# Patient Record
Sex: Female | Born: 1945 | Race: White | Hispanic: No | Marital: Married | Smoking: Never smoker
Health system: Southern US, Community
[De-identification: ages and names within clinical notes are randomized; demographics above are authoritative.]

## PROBLEM LIST (undated history)

## (undated) ENCOUNTER — Emergency Department (HOSPITAL_COMMUNITY): Payer: PPO | Source: Home / Self Care

## (undated) DIAGNOSIS — F329 Major depressive disorder, single episode, unspecified: Secondary | ICD-10-CM

## (undated) DIAGNOSIS — F32A Depression, unspecified: Secondary | ICD-10-CM

## (undated) DIAGNOSIS — J301 Allergic rhinitis due to pollen: Secondary | ICD-10-CM

## (undated) DIAGNOSIS — F419 Anxiety disorder, unspecified: Secondary | ICD-10-CM

## (undated) HISTORY — DX: Depression, unspecified: F32.A

## (undated) HISTORY — DX: Major depressive disorder, single episode, unspecified: F32.9

## (undated) HISTORY — DX: Anxiety disorder, unspecified: F41.9

## (undated) HISTORY — DX: Allergic rhinitis due to pollen: J30.1

## (undated) HISTORY — PX: TONSILLECTOMY: SHX5217

---

## 2001-07-07 ENCOUNTER — Encounter (INDEPENDENT_AMBULATORY_CARE_PROVIDER_SITE_OTHER): Payer: Self-pay | Admitting: *Deleted

## 2001-07-07 ENCOUNTER — Ambulatory Visit (HOSPITAL_COMMUNITY): Admission: RE | Admit: 2001-07-07 | Discharge: 2001-07-07 | Payer: Self-pay | Admitting: Gastroenterology

## 2001-11-14 ENCOUNTER — Other Ambulatory Visit: Admission: RE | Admit: 2001-11-14 | Discharge: 2001-11-14 | Payer: Self-pay | Admitting: *Deleted

## 2001-11-20 ENCOUNTER — Encounter: Admission: RE | Admit: 2001-11-20 | Discharge: 2001-11-20 | Payer: Self-pay | Admitting: *Deleted

## 2001-11-20 ENCOUNTER — Encounter: Payer: Self-pay | Admitting: *Deleted

## 2003-01-21 ENCOUNTER — Other Ambulatory Visit: Admission: RE | Admit: 2003-01-21 | Discharge: 2003-01-21 | Payer: Self-pay | Admitting: Obstetrics and Gynecology

## 2004-03-13 ENCOUNTER — Other Ambulatory Visit: Admission: RE | Admit: 2004-03-13 | Discharge: 2004-03-13 | Payer: Self-pay | Admitting: Family Medicine

## 2006-02-16 ENCOUNTER — Other Ambulatory Visit: Admission: RE | Admit: 2006-02-16 | Discharge: 2006-02-16 | Payer: Self-pay | Admitting: Obstetrics and Gynecology

## 2008-05-31 ENCOUNTER — Emergency Department (HOSPITAL_COMMUNITY): Admission: EM | Admit: 2008-05-31 | Discharge: 2008-05-31 | Payer: Self-pay | Admitting: Emergency Medicine

## 2008-06-07 ENCOUNTER — Encounter (HOSPITAL_COMMUNITY): Admission: RE | Admit: 2008-06-07 | Discharge: 2008-09-03 | Payer: Self-pay | Admitting: Emergency Medicine

## 2011-03-19 NOTE — Procedures (Signed)
[place]  Patient:    Angie Liu, Angie Liu Visit Number: [id number] MRN: [id number]          Service Type: Attending:  Petra Kuba, M.D. Dictated by:   Petra Kuba, M.D. Proc. Date: 07/07/01   CC:         Heather Roberts, M.D.   Procedure Report  PROCEDURE:  Colonoscopy with biopsy.  INDICATION:  Screening.  Consent was signed after risks, benefits, methods, and options thoroughly discussed in the office.  MEDICATIONS:  Demerol 50 mg, Versed 5 mg.  DESCRIPTION OF PROCEDURE:  Rectal inspection was pertinent for external hemorrhoids, small.  Digital exam was negative.  The video pediatric colonoscope was inserted, advanced around the colon to the cecum.  This did require some abdominal pressure but no position changes.  Cecum was identified by the appendiceal orifice and the ileocecal valve; in fact, the scope was inserted a short way into the terminal ileum, which was normal.  Photo documentation was obtained, and the scope was slowly withdrawn.  Cecum, ascending, and transverse were normal.  As the scope was withdrawn around the splenic flexure, an occasional left-sided diverticulum was seen.  These were rare.  Also one tiny questionable mid-descending polyp was seen and was cold biopsied x 2.  The scope was then further withdrawn.  No additional findings were seen as we slowly withdrew back to the rectum.  Once back in the rectum the scope was retroflexed, pertinent for some internal hemorrhoids.  The scope was straightened and readvanced a short way around the sigmoid, air was suctioned, and scope removed.  Though the prep was adequate, there was some liquid stool that required washing and suctioning.  The patient tolerated the procedure well.  There was no obvious immediate complication.  ENDOSCOPIC DIAGNOSES: 1. Small internal-external hemorrhoids. 2. Rare left-sided diverticula. 3. Tiny questionable descending polyp, status post cold  biopsy. 4. Otherwise within normal limits to the terminal ileum.  PLAN:  Await pathology.  Yearly rectals and guaiacs per Dr. Orson Aloe.  Happy to see back p.r.n.  Otherwise await pathology to determine future colonic screening. Dictated by:   Petra Kuba, M.D. Attending:  Petra Kuba, M.D. DD:  07/07/01 TD:  07/08/01 Job: [id number]

## 2012-02-11 ENCOUNTER — Encounter: Payer: Self-pay | Admitting: Family Medicine

## 2012-10-17 ENCOUNTER — Ambulatory Visit (INDEPENDENT_AMBULATORY_CARE_PROVIDER_SITE_OTHER): Payer: Medicare Other | Admitting: Family Medicine

## 2012-10-17 ENCOUNTER — Encounter: Payer: Self-pay | Admitting: Family Medicine

## 2012-10-17 VITALS — BP 120/60 | HR 77 | Temp 98.3°F | Resp 16 | Ht 65.75 in | Wt 128.6 lb

## 2012-10-17 DIAGNOSIS — Z79899 Other long term (current) drug therapy: Secondary | ICD-10-CM

## 2012-10-17 DIAGNOSIS — Z7689 Persons encountering health services in other specified circumstances: Secondary | ICD-10-CM

## 2012-10-17 DIAGNOSIS — Z7189 Other specified counseling: Secondary | ICD-10-CM

## 2012-10-17 NOTE — Patient Instructions (Addendum)
It was a pleasure meeting you today!  You will contact our office next spring or summer to schedule a CPE/PAP in September 2014.

## 2012-10-23 DIAGNOSIS — F418 Other specified anxiety disorders: Secondary | ICD-10-CM | POA: Insufficient documentation

## 2012-10-23 DIAGNOSIS — J302 Other seasonal allergic rhinitis: Secondary | ICD-10-CM | POA: Insufficient documentation

## 2012-10-23 NOTE — Progress Notes (Signed)
S:  This 66 y.o. Cauc female is new to [facility]; her previous PCPs either moved or retired. She has a psychiatrist (Dr. Alphonzo Lemmings) who has managed chronic depression (medications: Luvox, Remeron, Ambien and Xanax) since 2009. She takes multiple OTC supplements. The other active medical problems include osteoporosis and "hay fever" for which she takes Allegra. 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 She does consume alcohol daily. She recently has GYN exam with Dr. Zelphia Cairo and PAP was normal. Last colonoscopy was 2008 w/ Dr. Ewing Schlein.  ROS: Negative for medication adverse reactions, unexplained weight loss or gain, fatigue (though pt has chronic sleep disturbance), fever/ chills, CP or tightness, SOB or DOE, Gi disturbance, myalgias, arthralgias, rashes or pallor, agitation or behavior disturbances, SI/HI.  PMHx, Social HX and Family Hx have been reviewed.  O:  Filed Vitals:   10/17/12 1443  BP: 120/60  Pulse: 77  Temp: 98.3 F (36.8 C)  Resp: 16   GEN: In NAD; WN,WD. HEENT: [place]/AT; EOMI w/ clear conj and sclerae; otherwise unremarkable. NECK: Supple. COR: RRR. LUNGS: Normal resp rate and effort. MS: FROM; no c/c/e or deformities. NEURO: A&O x 3; Cns intact. Nonfocal. Mentation- pt is attentive and conversant; mood and affect are appropriate.  A/P: 1. Establishing care with new doctor, encounter for   2. Encounter for medication review    Pt would like to receive "Well Woman/GYN" care here also but would like to wait until Sept 2014 to schedule that exam.

## 2013-04-08 ENCOUNTER — Ambulatory Visit (INDEPENDENT_AMBULATORY_CARE_PROVIDER_SITE_OTHER): Payer: Medicare Other | Admitting: Emergency Medicine

## 2013-04-08 ENCOUNTER — Ambulatory Visit: Payer: Medicare Other

## 2013-04-08 VITALS — BP 100/67 | HR 66 | Temp 98.4°F | Resp 16 | Ht 67.6 in | Wt 128.6 lb

## 2013-04-08 DIAGNOSIS — S93409A Sprain of unspecified ligament of unspecified ankle, initial encounter: Secondary | ICD-10-CM

## 2013-04-08 DIAGNOSIS — M25579 Pain in unspecified ankle and joints of unspecified foot: Secondary | ICD-10-CM

## 2013-04-08 DIAGNOSIS — IMO0002 Reserved for concepts with insufficient information to code with codable children: Secondary | ICD-10-CM

## 2013-04-08 DIAGNOSIS — Z23 Encounter for immunization: Secondary | ICD-10-CM

## 2013-04-08 DIAGNOSIS — L089 Local infection of the skin and subcutaneous tissue, unspecified: Secondary | ICD-10-CM

## 2013-04-08 MED ORDER — SILVER SULFADIAZINE 1 % EX CREA: TOPICAL_CREAM | Freq: Every day | CUTANEOUS | Status: DC

## 2013-04-08 MED ORDER — SULFAMETHOXAZOLE-TRIMETHOPRIM 800-160 MG PO TABS: 1.0000 | ORAL_TABLET | Freq: Two times a day (BID) | ORAL | Status: DC

## 2013-04-08 MED ORDER — NAPROXEN SODIUM 550 MG PO TABS: 550.0000 mg | ORAL_TABLET | Freq: Two times a day (BID) | ORAL | Status: DC

## 2013-04-08 NOTE — Progress Notes (Signed)
[facility] [address], [place] [address] [phone]  Date:  04/08/2013   Name:  Angie Liu   DOB:  1945-12-22   MRN:  [id number]  PCP:  No PCP Per Patient    Chief Complaint: scrapped right ankle while riding on zipline   History of Present Illness:  Angie Liu is a 67 y.o. very pleasant female patient who presents with the following:  Injured right ankle a week ago while on a zip line. Has pain and swelling in lateral ankle.  Pain with ambulation.  Also has large lateral abrasion ankle with surrounding erythema.  No fever or chills.  No improvement with over the counter medications or other home remedies. Denies other complaint or health concern today.   Patient Active Problem List   Diagnosis Date Noted  . Allergic rhinitis, seasonal 10/23/2012  . Depression with anxiety 10/23/2012    Past Medical History  Diagnosis Date  . Depression   . Anxiety   . Hayfever     Past Surgical History  Procedure Laterality Date  . Tonsillectomy      History  Substance Use Topics  . Smoking status: Never Smoker   . Smokeless tobacco: Not on file  . Alcohol Use: Yes    Family History  Problem Relation Age of Onset  . Cancer Mother     colon  . Diabetes Father     No Known Allergies  Medication list has been reviewed and updated.  Current Outpatient Prescriptions on File Prior to Visit  Medication Sig Dispense Refill  . mirtazapine (REMERON) 15 MG tablet Take 15 mg by mouth at bedtime.      . ALPRAZolam (XANAX) 0.5 MG tablet Take 0.5 mg by mouth at bedtime as needed.      . fexofenadine (ALLEGRA) 180 MG tablet Take 180 mg by mouth daily.      . fluvoxaMINE (LUVOX) 50 MG tablet Take 50 mg by mouth at bedtime.      Marland Kitchen zolpidem (AMBIEN) 10 MG tablet Take 10 mg by mouth at bedtime as needed.       No current facility-administered medications on file prior to visit.    Review of Systems:  As per HPI, otherwise negative.    Physical  Examination: Filed Vitals:   04/08/13 0944  BP: 100/67  Pulse: 66  Temp: 98.4 F (36.9 C)  Resp: 16   Filed Vitals:   04/08/13 0944  Height: 5' 7.6" (1.717 m)  Weight: 128 lb 9.6 oz (58.333 kg)   Body mass index is 19.79 kg/(m^2). Ideal Body Weight: Weight in (lb) to have BMI = 25: 162.2   GEN: WDWN, NAD, Non-toxic, Alert & Oriented x 3 HEENT: Atraumatic, Normocephalic.  Ears and Nose: No external deformity. EXTR: No clubbing/cyanosis/edema NEURO: Normal gait.  PSYCH: Normally interactive. Conversant. Not depressed or anxious appearing.  Calm demeanor.  RIGHT ANKLE:  Large lateral abrasion with cellulitis.  Tender, swollen lateral ankle with guarding.  No crepitus  Assessment and Plan: Sprain ankle Anaprox Abrasion Cellulitis Silvadene dressing TDAP Septra  Signed,  Phillips Odor, MD   [facility] reading (PRIMARY) by  Dr. Dareen Piano.  Negative for osseous injury.

## 2013-04-08 NOTE — Patient Instructions (Addendum)
Abrasion °An abrasion is a cut or scrape of the skin. Abrasions do not extend through all layers of the skin and most heal within 10 days. It is important to care for your abrasion properly to prevent infection. °CAUSES  °Most abrasions are caused by falling on, or gliding across, the ground or other surface. When your skin rubs on something, the outer and inner layer of skin rubs off, causing an abrasion. °DIAGNOSIS  °Your caregiver will be able to diagnose an abrasion during a physical exam.  °TREATMENT  °Your treatment depends on how large and deep the abrasion is. Generally, your abrasion will be cleaned with water and a mild soap to remove any dirt or debris. An antibiotic ointment may be put over the abrasion to prevent an infection. A bandage (dressing) may be wrapped around the abrasion to keep it from getting dirty.  °You may need a tetanus shot if: °· You cannot remember when you had your last tetanus shot. °· You have never had a tetanus shot. °· The injury broke your skin. °If you get a tetanus shot, your arm may swell, get red, and feel warm to the touch. This is common and not a problem. If you need a tetanus shot and you choose not to have one, there is a rare chance of getting tetanus. Sickness from tetanus can be serious.  °HOME CARE INSTRUCTIONS  °· If a dressing was applied, change it at least once a day or as directed by your caregiver. If the bandage sticks, soak it off with warm water.   °· Wash the area with water and a mild soap to remove all the ointment 2 times a day. Rinse off the soap and pat the area dry with a clean towel.   °· Reapply any ointment as directed by your caregiver. This will help prevent infection and keep the bandage from sticking. Use gauze over the wound and under the dressing to help keep the bandage from sticking.   °· Change your dressing right away if it becomes wet or dirty.   °· Only take over-the-counter or prescription medicines for pain, discomfort, or fever as  directed by your caregiver.   °· Follow up with your caregiver within 24 48 hours for a wound check, or as directed. If you were not given a wound-check appointment, look closely at your abrasion for redness, swelling, or pus. These are signs of infection. °SEEK IMMEDIATE MEDICAL CARE IF:  °· You have increasing pain in the wound.   °· You have redness, swelling, or tenderness around the wound.   °· You have pus coming from the wound.   °· You have a fever or persistent symptoms for more than 2 3 days. °· You have a fever and your symptoms suddenly get worse. °· You have a bad smell coming from the wound or dressing.   °MAKE SURE YOU:  °· Understand these instructions. °· Will watch your condition. °· Will get help right away if you are not doing well or get worse. °Document Released: 07/28/2005 Document Revised: 10/04/2012 Document Reviewed: 09/21/2011 °ExitCare® Patient Information ©2014 [employer]. ° °

## 2013-04-30 ENCOUNTER — Other Ambulatory Visit: Payer: Self-pay | Admitting: Emergency Medicine

## 2013-05-01 NOTE — Telephone Encounter (Signed)
Dr Dareen Piano, do you want to give this pt any RFs of this, or need to see pt back first?

## 2014-07-18 ENCOUNTER — Encounter: Payer: Self-pay | Admitting: *Deleted

## 2014-07-18 DIAGNOSIS — Z1231 Encounter for screening mammogram for malignant neoplasm of breast: Secondary | ICD-10-CM | POA: Insufficient documentation

## 2014-07-22 ENCOUNTER — Other Ambulatory Visit: Payer: Self-pay | Admitting: Ophthalmology

## 2015-03-06 ENCOUNTER — Encounter: Payer: Self-pay | Admitting: Family Medicine

## 2015-03-06 ENCOUNTER — Ambulatory Visit (INDEPENDENT_AMBULATORY_CARE_PROVIDER_SITE_OTHER): Payer: Medicare Other | Admitting: Family Medicine

## 2015-03-06 VITALS — BP 102/56 | HR 67 | Temp 98.2°F | Resp 16 | Ht 65.75 in | Wt 121.8 lb

## 2015-03-06 DIAGNOSIS — E2839 Other primary ovarian failure: Secondary | ICD-10-CM

## 2015-03-06 DIAGNOSIS — M797 Fibromyalgia: Secondary | ICD-10-CM | POA: Diagnosis not present

## 2015-03-06 DIAGNOSIS — G259 Extrapyramidal and movement disorder, unspecified: Secondary | ICD-10-CM | POA: Diagnosis not present

## 2015-03-06 DIAGNOSIS — Z1211 Encounter for screening for malignant neoplasm of colon: Secondary | ICD-10-CM

## 2015-03-06 DIAGNOSIS — M810 Age-related osteoporosis without current pathological fracture: Secondary | ICD-10-CM | POA: Diagnosis not present

## 2015-03-06 DIAGNOSIS — H9193 Unspecified hearing loss, bilateral: Secondary | ICD-10-CM

## 2015-03-06 DIAGNOSIS — M7918 Myalgia, other site: Secondary | ICD-10-CM

## 2015-03-06 DIAGNOSIS — Z0000 Encounter for general adult medical examination without abnormal findings: Secondary | ICD-10-CM

## 2015-03-06 DIAGNOSIS — Z1212 Encounter for screening for malignant neoplasm of rectum: Secondary | ICD-10-CM

## 2015-03-06 NOTE — Progress Notes (Signed)
Subjective:    Patient ID: Angie Liu, female    DOB: 1946/07/09, 69 y.o.   MRN: [id number]  HPI  This 69 y.o. Female is here for [facility] Subsequent annual exam. She is in a wellness program w/ Dr. Mingo Amber; this involved testing for toxins and supplements designed to "detox" her system. Pt had been taking prescriptions medications for anxiety and insomnia but if off all prescriptions medications and feels well ("the best I have felt in a long time").  She has chronic hearing loss and needs a referral (w/ very specific requirements) to follow-up at [facility]. Pt also receives treatment for "movement and balance" at [facility] and requests a referral for their services.  HCM: PAP- NA.           MMG- Current.           CRS- 2002; overdue.           DEXA- Due.           IMM- Current but pt declines Pneumonia vaccine.           Vision- Current.           Dental- current.  Patient Active Problem List   Diagnosis Date Noted  . Visit for screening mammogram 07/18/2014  . Allergic rhinitis, seasonal 10/23/2012  . Depression with anxiety- in resmisson 10/23/2012    Prior to Admission medications   Medication Sig Start Date End Date Taking? Authorizing Provider  OVER THE COUNTER MEDICATION 1.5 mg.   Yes Historical Provider, MD  OVER THE COUNTER MEDICATION daily.   Yes Historical Provider, MD  OVER THE COUNTER MEDICATION 600 mg at bedtime.   Yes Historical Provider, MD  OVER THE COUNTER MEDICATION    Yes Historical Provider, MD  OVER THE COUNTER MEDICATION    Yes Historical Provider, MD  QUERCETIN PO Take by mouth.   Yes Historical Provider, MD  vitamin C (ASCORBIC ACID) 500 MG tablet Take 500 mg by mouth 2 (two) times daily.   Yes Historical Provider, MD    Past Surgical History  Procedure Laterality Date  . Tonsillectomy      History   Social History  . Marital Status: Married    Spouse Name: [name] N/A  . Number of Children: N/A  . Years of  Education: N/A   Occupational History  . Engineer, [employer] for [employer]   Social History Main Topics  . Smoking status: Never Smoker   . Smokeless tobacco: Not on file  . Alcohol Use: Yes  . Drug Use: No  . Sexual Activity: Not on file   Other Topics Concern  . Other volunteer activities.   Social History Narrative   Family History  Problem Relation Age of Onset  . Cancer Mother     colon  . Diabetes Father     Review of Systems  Constitutional: Negative.   HENT: Positive for hearing loss.   Eyes: Negative.   Respiratory: Negative.   Cardiovascular: Negative.   Gastrointestinal: Negative.   Endocrine: Negative.   Genitourinary: Negative.   Musculoskeletal: Positive for myalgias, back pain, arthralgias, neck pain and neck stiffness. Negative for joint swelling and gait problem.  Allergic/Immunologic: Negative.   Neurological: Negative.   Hematological: Negative.   Psychiatric/Behavioral: Positive for sleep disturbance. Negative for behavioral problems, confusion, dysphoric mood and decreased concentration.       Objective:   Physical Exam  Constitutional: Vital signs are normal. She appears well-developed and  well-nourished. No distress.  Blood pressure 102/56, pulse 67, temperature 98.2 F (36.8 C), temperature source Oral, resp. rate 16, height 5' 5.75" (1.67 m), weight 121 lb 12.8 oz (55.248 kg), SpO2 97 %.   HENT:  Head: Normocephalic and atraumatic.  Right Ear: External ear normal. Decreased hearing is noted.  Left Ear: External ear normal. Decreased hearing is noted.  Nose: No nasal deformity or septal deviation.  Mouth/Throat: Uvula is midline, oropharynx is clear and moist and mucous membranes are normal. No oral lesions. Normal dentition. No dental caries.  Wearing hearing aids.  Eyes: Conjunctivae, EOM and lids are normal. Pupils are equal, round, and reactive to light. No scleral icterus.  Wears corrective lenses.  Neck: Trachea normal, normal range of  motion, full passive range of motion without pain and phonation normal. Neck supple. No spinous process tenderness and no muscular tenderness present. Carotid bruit is not present. No thyroid mass and no thyromegaly present.  Pulmonary/Chest: Right breast exhibits no inverted nipple, no mass, no nipple discharge, no skin change and no tenderness. Left breast exhibits no inverted nipple, no mass, no nipple discharge, no skin change and no tenderness. Breasts are symmetrical.  Abdominal: Soft. Normal appearance, normal aorta and bowel sounds are normal. She exhibits no distension and no mass. There is no hepatosplenomegaly. There is no tenderness. There is no guarding and no CVA tenderness.  Musculoskeletal:       Cervical back: Normal.       Thoracic back: Normal.       Lumbar back: Normal.  Mild muscle spasms in paravertebral muscles; remainder of exam unremarkable.  Lymphadenopathy:       Head (right side): No submental, no submandibular, no tonsillar, no preauricular, no posterior auricular and no occipital adenopathy present.       Head (left side): No submental, no submandibular, no tonsillar, no preauricular, no posterior auricular and no occipital adenopathy present.    She has no cervical adenopathy.    She has no axillary adenopathy.       Right: No inguinal and no supraclavicular adenopathy present.       Left: No inguinal and no supraclavicular adenopathy present.  Neurological: She is alert. She has normal strength and normal reflexes. She displays no atrophy. No cranial nerve deficit or sensory deficit. She exhibits normal muscle tone. She displays a negative Romberg sign. Coordination and gait normal. She displays no Babinski's sign on the right side. She displays no Babinski's sign on the left side.  GET UP and GO Test: time= 6 seconds.  Skin: Skin is warm, dry and intact. No ecchymosis, no lesion and no rash noted. She is not diaphoretic. No cyanosis or erythema. No pallor. Nails show  no clubbing.  Few superficial varicosities on posterior aspect of legs.  Psychiatric: She has a normal mood and affect. Her speech is normal and behavior is normal. Judgment and thought content normal. Cognition and memory are normal.  Nursing note and vitals reviewed.      Assessment & Plan:  Medicare annual wellness visit, subsequent- Pt declined labs; Dr. Jaynie Collins performs routine labs.   Hearing loss, bilateral - Plan: Ambulatory referral to Audiology  Osteoporosis - Last  DEXA > 3 years ago. Plan: DG Bone Density  Estrogen deficiency - Plan: DG Bone Density  Myofacial muscle pain - Plan: Ambulatory referral to Physical Therapy  Movement disorder - Plan: Ambulatory referral to Physical Therapy  Encounter for colorectal cancer screening - Plan: Ambulatory referral to Gastroenterology

## 2015-03-06 NOTE — Patient Instructions (Signed)

## 2015-03-25 ENCOUNTER — Telehealth: Payer: Self-pay | Admitting: *Deleted

## 2015-03-25 NOTE — Telephone Encounter (Signed)
Mailed RX for hearing evaluation, per Dr Leward Quan.

## 2015-04-16 ENCOUNTER — Telehealth: Payer: Self-pay | Admitting: Radiology

## 2015-04-16 NOTE — Telephone Encounter (Signed)
[facility] called to inform our office that this patient cancelled her colonoscopy

## 2015-04-16 NOTE — Telephone Encounter (Signed)
Noted  

## 2015-07-24 LAB — HM MAMMOGRAPHY

## 2015-07-24 LAB — HM DEXA SCAN

## 2015-08-27 ENCOUNTER — Encounter: Payer: Self-pay | Admitting: Family Medicine

## 2015-09-23 ENCOUNTER — Encounter: Payer: Self-pay | Admitting: Family Medicine

## 2015-11-03 DIAGNOSIS — M9901 Segmental and somatic dysfunction of cervical region: Secondary | ICD-10-CM | POA: Diagnosis not present

## 2015-11-03 DIAGNOSIS — M5030 Other cervical disc degeneration, unspecified cervical region: Secondary | ICD-10-CM | POA: Diagnosis not present

## 2015-11-03 DIAGNOSIS — M5136 Other intervertebral disc degeneration, lumbar region: Secondary | ICD-10-CM | POA: Diagnosis not present

## 2015-11-03 DIAGNOSIS — M9902 Segmental and somatic dysfunction of thoracic region: Secondary | ICD-10-CM | POA: Diagnosis not present

## 2015-11-03 DIAGNOSIS — M9903 Segmental and somatic dysfunction of lumbar region: Secondary | ICD-10-CM | POA: Diagnosis not present

## 2015-11-04 DIAGNOSIS — N362 Urethral caruncle: Secondary | ICD-10-CM | POA: Diagnosis not present

## 2015-11-08 DIAGNOSIS — N951 Menopausal and female climacteric states: Secondary | ICD-10-CM | POA: Diagnosis not present

## 2015-11-10 DIAGNOSIS — N951 Menopausal and female climacteric states: Secondary | ICD-10-CM | POA: Diagnosis not present

## 2015-11-12 DIAGNOSIS — N951 Menopausal and female climacteric states: Secondary | ICD-10-CM | POA: Diagnosis not present

## 2015-12-02 DIAGNOSIS — F411 Generalized anxiety disorder: Secondary | ICD-10-CM | POA: Diagnosis not present

## 2015-12-09 DIAGNOSIS — G4700 Insomnia, unspecified: Secondary | ICD-10-CM | POA: Diagnosis not present

## 2015-12-12 DIAGNOSIS — M9902 Segmental and somatic dysfunction of thoracic region: Secondary | ICD-10-CM | POA: Diagnosis not present

## 2015-12-12 DIAGNOSIS — M9901 Segmental and somatic dysfunction of cervical region: Secondary | ICD-10-CM | POA: Diagnosis not present

## 2015-12-12 DIAGNOSIS — M5030 Other cervical disc degeneration, unspecified cervical region: Secondary | ICD-10-CM | POA: Diagnosis not present

## 2015-12-12 DIAGNOSIS — M9903 Segmental and somatic dysfunction of lumbar region: Secondary | ICD-10-CM | POA: Diagnosis not present

## 2015-12-12 DIAGNOSIS — M5136 Other intervertebral disc degeneration, lumbar region: Secondary | ICD-10-CM | POA: Diagnosis not present

## 2015-12-15 DIAGNOSIS — M9903 Segmental and somatic dysfunction of lumbar region: Secondary | ICD-10-CM | POA: Diagnosis not present

## 2015-12-15 DIAGNOSIS — M9901 Segmental and somatic dysfunction of cervical region: Secondary | ICD-10-CM | POA: Diagnosis not present

## 2015-12-15 DIAGNOSIS — M5136 Other intervertebral disc degeneration, lumbar region: Secondary | ICD-10-CM | POA: Diagnosis not present

## 2015-12-15 DIAGNOSIS — M9902 Segmental and somatic dysfunction of thoracic region: Secondary | ICD-10-CM | POA: Diagnosis not present

## 2015-12-15 DIAGNOSIS — M5030 Other cervical disc degeneration, unspecified cervical region: Secondary | ICD-10-CM | POA: Diagnosis not present

## 2015-12-22 DIAGNOSIS — F3181 Bipolar II disorder: Secondary | ICD-10-CM | POA: Diagnosis not present

## 2015-12-29 DIAGNOSIS — M9903 Segmental and somatic dysfunction of lumbar region: Secondary | ICD-10-CM | POA: Diagnosis not present

## 2015-12-29 DIAGNOSIS — M9902 Segmental and somatic dysfunction of thoracic region: Secondary | ICD-10-CM | POA: Diagnosis not present

## 2015-12-29 DIAGNOSIS — M5136 Other intervertebral disc degeneration, lumbar region: Secondary | ICD-10-CM | POA: Diagnosis not present

## 2015-12-29 DIAGNOSIS — M5030 Other cervical disc degeneration, unspecified cervical region: Secondary | ICD-10-CM | POA: Diagnosis not present

## 2015-12-29 DIAGNOSIS — M9901 Segmental and somatic dysfunction of cervical region: Secondary | ICD-10-CM | POA: Diagnosis not present

## 2016-01-05 DIAGNOSIS — F411 Generalized anxiety disorder: Secondary | ICD-10-CM | POA: Diagnosis not present

## 2016-01-09 DIAGNOSIS — M9903 Segmental and somatic dysfunction of lumbar region: Secondary | ICD-10-CM | POA: Diagnosis not present

## 2016-01-09 DIAGNOSIS — M5136 Other intervertebral disc degeneration, lumbar region: Secondary | ICD-10-CM | POA: Diagnosis not present

## 2016-01-09 DIAGNOSIS — M9901 Segmental and somatic dysfunction of cervical region: Secondary | ICD-10-CM | POA: Diagnosis not present

## 2016-01-09 DIAGNOSIS — M5030 Other cervical disc degeneration, unspecified cervical region: Secondary | ICD-10-CM | POA: Diagnosis not present

## 2016-01-09 DIAGNOSIS — M9902 Segmental and somatic dysfunction of thoracic region: Secondary | ICD-10-CM | POA: Diagnosis not present

## 2016-01-12 DIAGNOSIS — R5383 Other fatigue: Secondary | ICD-10-CM | POA: Diagnosis not present

## 2016-01-14 DIAGNOSIS — R5383 Other fatigue: Secondary | ICD-10-CM | POA: Diagnosis not present

## 2016-01-19 DIAGNOSIS — M9902 Segmental and somatic dysfunction of thoracic region: Secondary | ICD-10-CM | POA: Diagnosis not present

## 2016-01-19 DIAGNOSIS — M5030 Other cervical disc degeneration, unspecified cervical region: Secondary | ICD-10-CM | POA: Diagnosis not present

## 2016-01-19 DIAGNOSIS — M5136 Other intervertebral disc degeneration, lumbar region: Secondary | ICD-10-CM | POA: Diagnosis not present

## 2016-01-19 DIAGNOSIS — M9901 Segmental and somatic dysfunction of cervical region: Secondary | ICD-10-CM | POA: Diagnosis not present

## 2016-01-19 DIAGNOSIS — M9903 Segmental and somatic dysfunction of lumbar region: Secondary | ICD-10-CM | POA: Diagnosis not present

## 2016-01-20 DIAGNOSIS — F411 Generalized anxiety disorder: Secondary | ICD-10-CM | POA: Diagnosis not present

## 2016-02-09 DIAGNOSIS — F3181 Bipolar II disorder: Secondary | ICD-10-CM | POA: Diagnosis not present

## 2016-02-12 DIAGNOSIS — E559 Vitamin D deficiency, unspecified: Secondary | ICD-10-CM | POA: Diagnosis not present

## 2016-02-12 DIAGNOSIS — N951 Menopausal and female climacteric states: Secondary | ICD-10-CM | POA: Diagnosis not present

## 2016-02-12 DIAGNOSIS — R5383 Other fatigue: Secondary | ICD-10-CM | POA: Diagnosis not present

## 2016-02-12 DIAGNOSIS — R7989 Other specified abnormal findings of blood chemistry: Secondary | ICD-10-CM | POA: Diagnosis not present

## 2016-02-17 DIAGNOSIS — N951 Menopausal and female climacteric states: Secondary | ICD-10-CM | POA: Diagnosis not present

## 2016-02-18 DIAGNOSIS — F3181 Bipolar II disorder: Secondary | ICD-10-CM | POA: Diagnosis not present

## 2016-03-09 DIAGNOSIS — F3181 Bipolar II disorder: Secondary | ICD-10-CM | POA: Diagnosis not present

## 2016-03-15 DIAGNOSIS — R5383 Other fatigue: Secondary | ICD-10-CM | POA: Diagnosis not present

## 2016-04-07 DIAGNOSIS — Z124 Encounter for screening for malignant neoplasm of cervix: Secondary | ICD-10-CM | POA: Diagnosis not present

## 2016-04-07 DIAGNOSIS — Z01419 Encounter for gynecological examination (general) (routine) without abnormal findings: Secondary | ICD-10-CM | POA: Diagnosis not present

## 2016-04-07 DIAGNOSIS — Z6820 Body mass index (BMI) 20.0-20.9, adult: Secondary | ICD-10-CM | POA: Diagnosis not present

## 2016-04-09 DIAGNOSIS — H353112 Nonexudative age-related macular degeneration, right eye, intermediate dry stage: Secondary | ICD-10-CM | POA: Diagnosis not present

## 2016-04-09 DIAGNOSIS — Z0100 Encounter for examination of eyes and vision without abnormal findings: Secondary | ICD-10-CM | POA: Diagnosis not present

## 2016-04-09 DIAGNOSIS — D3132 Benign neoplasm of left choroid: Secondary | ICD-10-CM | POA: Diagnosis not present

## 2016-04-09 DIAGNOSIS — H353122 Nonexudative age-related macular degeneration, left eye, intermediate dry stage: Secondary | ICD-10-CM | POA: Diagnosis not present

## 2016-04-15 DIAGNOSIS — G4700 Insomnia, unspecified: Secondary | ICD-10-CM | POA: Diagnosis not present

## 2016-04-15 DIAGNOSIS — F3181 Bipolar II disorder: Secondary | ICD-10-CM | POA: Diagnosis not present

## 2016-04-28 DIAGNOSIS — F3181 Bipolar II disorder: Secondary | ICD-10-CM | POA: Diagnosis not present

## 2016-05-05 DIAGNOSIS — L821 Other seborrheic keratosis: Secondary | ICD-10-CM | POA: Diagnosis not present

## 2016-05-05 DIAGNOSIS — D1801 Hemangioma of skin and subcutaneous tissue: Secondary | ICD-10-CM | POA: Diagnosis not present

## 2016-05-05 DIAGNOSIS — D2272 Melanocytic nevi of left lower limb, including hip: Secondary | ICD-10-CM | POA: Diagnosis not present

## 2016-05-05 DIAGNOSIS — D225 Melanocytic nevi of trunk: Secondary | ICD-10-CM | POA: Diagnosis not present

## 2016-05-05 DIAGNOSIS — D2271 Melanocytic nevi of right lower limb, including hip: Secondary | ICD-10-CM | POA: Diagnosis not present

## 2016-05-05 DIAGNOSIS — L814 Other melanin hyperpigmentation: Secondary | ICD-10-CM | POA: Diagnosis not present

## 2016-05-05 DIAGNOSIS — G4700 Insomnia, unspecified: Secondary | ICD-10-CM | POA: Diagnosis not present

## 2016-05-05 DIAGNOSIS — Z85828 Personal history of other malignant neoplasm of skin: Secondary | ICD-10-CM | POA: Diagnosis not present

## 2016-05-10 DIAGNOSIS — F3181 Bipolar II disorder: Secondary | ICD-10-CM | POA: Diagnosis not present

## 2016-05-13 DIAGNOSIS — G4700 Insomnia, unspecified: Secondary | ICD-10-CM | POA: Diagnosis not present

## 2016-05-14 DIAGNOSIS — G4700 Insomnia, unspecified: Secondary | ICD-10-CM | POA: Diagnosis not present

## 2016-05-25 DIAGNOSIS — G4700 Insomnia, unspecified: Secondary | ICD-10-CM | POA: Diagnosis not present

## 2016-06-03 DIAGNOSIS — H2511 Age-related nuclear cataract, right eye: Secondary | ICD-10-CM | POA: Diagnosis not present

## 2016-06-03 DIAGNOSIS — H25811 Combined forms of age-related cataract, right eye: Secondary | ICD-10-CM | POA: Diagnosis not present

## 2016-06-15 DIAGNOSIS — F3181 Bipolar II disorder: Secondary | ICD-10-CM | POA: Diagnosis not present

## 2016-06-15 DIAGNOSIS — G4700 Insomnia, unspecified: Secondary | ICD-10-CM | POA: Diagnosis not present

## 2016-06-29 DIAGNOSIS — F3181 Bipolar II disorder: Secondary | ICD-10-CM | POA: Diagnosis not present

## 2016-07-01 DIAGNOSIS — H2512 Age-related nuclear cataract, left eye: Secondary | ICD-10-CM | POA: Diagnosis not present

## 2016-07-01 DIAGNOSIS — H25812 Combined forms of age-related cataract, left eye: Secondary | ICD-10-CM | POA: Diagnosis not present

## 2016-07-15 ENCOUNTER — Other Ambulatory Visit (HOSPITAL_COMMUNITY): Payer: Self-pay | Admitting: Psychiatry

## 2016-07-15 DIAGNOSIS — G4700 Insomnia, unspecified: Secondary | ICD-10-CM | POA: Diagnosis not present

## 2016-07-27 DIAGNOSIS — Z961 Presence of intraocular lens: Secondary | ICD-10-CM | POA: Diagnosis not present

## 2016-07-29 DIAGNOSIS — F3181 Bipolar II disorder: Secondary | ICD-10-CM | POA: Diagnosis not present

## 2016-08-05 DIAGNOSIS — F1020 Alcohol dependence, uncomplicated: Secondary | ICD-10-CM | POA: Diagnosis not present

## 2016-08-05 DIAGNOSIS — H6123 Impacted cerumen, bilateral: Secondary | ICD-10-CM | POA: Diagnosis not present

## 2016-08-05 DIAGNOSIS — H93293 Other abnormal auditory perceptions, bilateral: Secondary | ICD-10-CM | POA: Diagnosis not present

## 2016-08-19 DIAGNOSIS — G4700 Insomnia, unspecified: Secondary | ICD-10-CM | POA: Diagnosis not present

## 2016-08-26 DIAGNOSIS — G4700 Insomnia, unspecified: Secondary | ICD-10-CM | POA: Diagnosis not present

## 2016-09-07 DIAGNOSIS — F3181 Bipolar II disorder: Secondary | ICD-10-CM | POA: Diagnosis not present

## 2016-10-14 DIAGNOSIS — Z1231 Encounter for screening mammogram for malignant neoplasm of breast: Secondary | ICD-10-CM | POA: Diagnosis not present

## 2016-10-18 DIAGNOSIS — G4700 Insomnia, unspecified: Secondary | ICD-10-CM | POA: Diagnosis not present

## 2016-11-01 HISTORY — PX: CATARACT EXTRACTION W/PHACO: SHX586

## 2016-11-04 DIAGNOSIS — F3181 Bipolar II disorder: Secondary | ICD-10-CM | POA: Diagnosis not present

## 2016-11-25 DIAGNOSIS — G4700 Insomnia, unspecified: Secondary | ICD-10-CM | POA: Diagnosis not present

## 2017-01-25 DIAGNOSIS — G4700 Insomnia, unspecified: Secondary | ICD-10-CM | POA: Diagnosis not present

## 2017-02-14 DIAGNOSIS — H353132 Nonexudative age-related macular degeneration, bilateral, intermediate dry stage: Secondary | ICD-10-CM | POA: Diagnosis not present

## 2017-02-14 DIAGNOSIS — D3132 Benign neoplasm of left choroid: Secondary | ICD-10-CM | POA: Diagnosis not present

## 2017-04-04 DIAGNOSIS — F3181 Bipolar II disorder: Secondary | ICD-10-CM | POA: Diagnosis not present

## 2017-04-26 DIAGNOSIS — G4700 Insomnia, unspecified: Secondary | ICD-10-CM | POA: Diagnosis not present

## 2017-07-05 DIAGNOSIS — G4700 Insomnia, unspecified: Secondary | ICD-10-CM | POA: Diagnosis not present

## 2017-07-07 DIAGNOSIS — F3181 Bipolar II disorder: Secondary | ICD-10-CM | POA: Diagnosis not present

## 2017-07-20 DIAGNOSIS — H9113 Presbycusis, bilateral: Secondary | ICD-10-CM | POA: Diagnosis not present

## 2017-07-20 DIAGNOSIS — H6123 Impacted cerumen, bilateral: Secondary | ICD-10-CM | POA: Diagnosis not present

## 2017-07-21 DIAGNOSIS — L853 Xerosis cutis: Secondary | ICD-10-CM | POA: Diagnosis not present

## 2017-07-21 DIAGNOSIS — L218 Other seborrheic dermatitis: Secondary | ICD-10-CM | POA: Diagnosis not present

## 2017-07-21 DIAGNOSIS — Z85828 Personal history of other malignant neoplasm of skin: Secondary | ICD-10-CM | POA: Diagnosis not present

## 2017-07-21 DIAGNOSIS — D1801 Hemangioma of skin and subcutaneous tissue: Secondary | ICD-10-CM | POA: Diagnosis not present

## 2017-07-21 DIAGNOSIS — D225 Melanocytic nevi of trunk: Secondary | ICD-10-CM | POA: Diagnosis not present

## 2017-07-21 DIAGNOSIS — L814 Other melanin hyperpigmentation: Secondary | ICD-10-CM | POA: Diagnosis not present

## 2017-07-21 DIAGNOSIS — R748 Abnormal levels of other serum enzymes: Secondary | ICD-10-CM | POA: Diagnosis not present

## 2017-07-21 DIAGNOSIS — L219 Seborrheic dermatitis, unspecified: Secondary | ICD-10-CM | POA: Insufficient documentation

## 2017-07-21 DIAGNOSIS — K5901 Slow transit constipation: Secondary | ICD-10-CM | POA: Diagnosis not present

## 2017-07-21 DIAGNOSIS — Z800 Family history of malignant neoplasm of digestive organs: Secondary | ICD-10-CM | POA: Diagnosis not present

## 2017-07-29 DIAGNOSIS — K5901 Slow transit constipation: Secondary | ICD-10-CM | POA: Diagnosis not present

## 2017-07-29 DIAGNOSIS — Z800 Family history of malignant neoplasm of digestive organs: Secondary | ICD-10-CM | POA: Diagnosis not present

## 2017-07-29 DIAGNOSIS — Z1211 Encounter for screening for malignant neoplasm of colon: Secondary | ICD-10-CM | POA: Diagnosis not present

## 2017-07-29 DIAGNOSIS — K5730 Diverticulosis of large intestine without perforation or abscess without bleeding: Secondary | ICD-10-CM | POA: Diagnosis not present

## 2017-08-03 ENCOUNTER — Other Ambulatory Visit: Payer: Self-pay | Admitting: Gastroenterology

## 2017-08-03 DIAGNOSIS — R7989 Other specified abnormal findings of blood chemistry: Secondary | ICD-10-CM

## 2017-08-03 DIAGNOSIS — R945 Abnormal results of liver function studies: Secondary | ICD-10-CM

## 2017-08-11 ENCOUNTER — Ambulatory Visit
Admission: RE | Admit: 2017-08-11 | Discharge: 2017-08-11 | Disposition: A | Discharge status: Home / Self Care | Payer: PPO | Source: Ambulatory Visit | Attending: Gastroenterology | Admitting: Gastroenterology

## 2017-08-11 DIAGNOSIS — R748 Abnormal levels of other serum enzymes: Secondary | ICD-10-CM | POA: Diagnosis not present

## 2017-08-11 DIAGNOSIS — R7989 Other specified abnormal findings of blood chemistry: Secondary | ICD-10-CM

## 2017-08-11 DIAGNOSIS — R945 Abnormal results of liver function studies: Secondary | ICD-10-CM | POA: Diagnosis not present

## 2017-08-12 ENCOUNTER — Other Ambulatory Visit: Payer: Self-pay | Admitting: Gastroenterology

## 2017-08-12 DIAGNOSIS — R9389 Abnormal findings on diagnostic imaging of other specified body structures: Secondary | ICD-10-CM

## 2017-08-16 DIAGNOSIS — G4700 Insomnia, unspecified: Secondary | ICD-10-CM | POA: Diagnosis not present

## 2017-08-23 DIAGNOSIS — H524 Presbyopia: Secondary | ICD-10-CM | POA: Diagnosis not present

## 2017-08-23 DIAGNOSIS — H353132 Nonexudative age-related macular degeneration, bilateral, intermediate dry stage: Secondary | ICD-10-CM | POA: Diagnosis not present

## 2017-08-23 DIAGNOSIS — D3132 Benign neoplasm of left choroid: Secondary | ICD-10-CM | POA: Diagnosis not present

## 2017-08-23 DIAGNOSIS — H532 Diplopia: Secondary | ICD-10-CM | POA: Diagnosis not present

## 2017-08-26 ENCOUNTER — Other Ambulatory Visit: Payer: Self-pay | Admitting: Oncology

## 2017-08-30 ENCOUNTER — Ambulatory Visit
Admission: RE | Admit: 2017-08-30 | Discharge: 2017-08-30 | Disposition: A | Discharge status: Home / Self Care | Payer: PPO | Source: Ambulatory Visit | Attending: Gastroenterology | Admitting: Gastroenterology

## 2017-08-30 DIAGNOSIS — K7689 Other specified diseases of liver: Secondary | ICD-10-CM | POA: Diagnosis not present

## 2017-08-30 DIAGNOSIS — R9389 Abnormal findings on diagnostic imaging of other specified body structures: Secondary | ICD-10-CM

## 2017-08-30 MED ORDER — GADOBENATE DIMEGLUMINE 529 MG/ML IV SOLN
10.0000 mL | Freq: Once | INTRAVENOUS | Status: AC | PRN
  Administered 2017-08-30: 10 mL via INTRAVENOUS

## 2017-09-02 ENCOUNTER — Other Ambulatory Visit: Payer: PPO

## 2017-09-15 DIAGNOSIS — K5901 Slow transit constipation: Secondary | ICD-10-CM | POA: Diagnosis not present

## 2017-09-15 DIAGNOSIS — Z800 Family history of malignant neoplasm of digestive organs: Secondary | ICD-10-CM | POA: Diagnosis not present

## 2017-09-19 DIAGNOSIS — F064 Anxiety disorder due to known physiological condition: Secondary | ICD-10-CM | POA: Diagnosis not present

## 2017-10-04 DIAGNOSIS — F411 Generalized anxiety disorder: Secondary | ICD-10-CM | POA: Diagnosis not present

## 2017-10-17 DIAGNOSIS — H9313 Tinnitus, bilateral: Secondary | ICD-10-CM | POA: Diagnosis not present

## 2017-10-17 DIAGNOSIS — H903 Sensorineural hearing loss, bilateral: Secondary | ICD-10-CM | POA: Diagnosis not present

## 2017-10-17 DIAGNOSIS — H6123 Impacted cerumen, bilateral: Secondary | ICD-10-CM | POA: Insufficient documentation

## 2017-10-18 DIAGNOSIS — F064 Anxiety disorder due to known physiological condition: Secondary | ICD-10-CM | POA: Diagnosis not present

## 2017-10-18 DIAGNOSIS — G4700 Insomnia, unspecified: Secondary | ICD-10-CM | POA: Diagnosis not present

## 2017-10-20 ENCOUNTER — Ambulatory Visit: Payer: PPO | Admitting: Podiatry

## 2017-10-20 ENCOUNTER — Encounter: Payer: Self-pay | Admitting: Podiatry

## 2017-10-20 DIAGNOSIS — L03031 Cellulitis of right toe: Secondary | ICD-10-CM

## 2017-10-20 NOTE — Progress Notes (Signed)
  Subjective:  Patient ID: Angie Liu, female    DOB: Dec 03, 1945,  MRN: [id number] HPI Chief Complaint  Patient presents with  . Toe Pain    Hallux right - medial border - patient states she fell on Thurs(1 week ago) and hit the toenail and broke the corner of nail off, slightly red and swollen, keeping covered with bandaid    71 y.o. female presents with the above complaint.     Past Medical History:  Diagnosis Date  . Anxiety   . Depression   . Hayfever    Past Surgical History:  Procedure Laterality Date  . TONSILLECTOMY      Current Outpatient Medications:  .  ALPRAZolam (XANAX) 0.5 MG tablet, , Disp: , Rfl:  .  zolpidem (AMBIEN CR) 12.5 MG CR tablet, , Disp: , Rfl:   Allergies  Allergen Reactions  . Latex Rash   Review of Systems  All other systems reviewed and are negative.  Objective:  There were no vitals filed for this visit.  General: Well developed, nourished, in no acute distress, alert and oriented x3   Dermatological: Skin is warm, dry and supple bilateral. Nails x 10 are well maintained; remaining integument appears unremarkable at this time. There are no open sores, no preulcerative lesions, no rash or signs of infection present.  She retained a small spicule of nail to the medial aspect of the tibial border hallux right.  There is no erythema edema cellulitis drainage or odor the nail was split and pulled back.  I trimmed that out today placed small amount of Betadine in the area and covered with a Band-Aid.  Vascular: Dorsalis Pedis artery and Posterior Tibial artery pedal pulses are 2/4 bilateral with immedate capillary fill time. Pedal hair growth present. No varicosities and no lower extremity edema present bilateral.   Neruologic: Grossly intact via light touch bilateral. Vibratory intact via tuning fork bilateral. Protective threshold with Semmes Wienstein monofilament intact to all pedal sites bilateral. Patellar and Achilles deep tendon reflexes  2+ bilateral. No Babinski or clonus noted bilateral.   Musculoskeletal: No gross boney pedal deformities bilateral. No pain, crepitus, or limitation noted with foot and ankle range of motion bilateral. Muscular strength 5/5 in all groups tested bilateral.  Gait: Unassisted, Nonantalgic.    Radiographs:  None taken  Assessment & Plan:   Assessment: Paronychia tibial border hallux right  Plan: Trim the nail from the margin today instructed her to soak in Epsom salts and warm water.  Follow-up with me with any complications.     Max T. [place], [place]

## 2017-11-15 DIAGNOSIS — F411 Generalized anxiety disorder: Secondary | ICD-10-CM | POA: Diagnosis not present

## 2017-12-12 DIAGNOSIS — F064 Anxiety disorder due to known physiological condition: Secondary | ICD-10-CM | POA: Diagnosis not present

## 2017-12-15 DIAGNOSIS — H9113 Presbycusis, bilateral: Secondary | ICD-10-CM | POA: Insufficient documentation

## 2017-12-27 DIAGNOSIS — F5104 Psychophysiologic insomnia: Secondary | ICD-10-CM | POA: Diagnosis not present

## 2017-12-27 DIAGNOSIS — K7689 Other specified diseases of liver: Secondary | ICD-10-CM | POA: Diagnosis not present

## 2017-12-27 DIAGNOSIS — Z8659 Personal history of other mental and behavioral disorders: Secondary | ICD-10-CM | POA: Diagnosis not present

## 2017-12-27 DIAGNOSIS — K5901 Slow transit constipation: Secondary | ICD-10-CM | POA: Diagnosis not present

## 2017-12-28 DIAGNOSIS — R946 Abnormal results of thyroid function studies: Secondary | ICD-10-CM | POA: Diagnosis not present

## 2017-12-31 IMAGING — MR MR ABDOMEN WO/W CM
18 series · 48 of 48 positions shown · IV contrast (Multihance 10ml)
Comparison: Ultrasound 08/11/2017

CLINICAL DATA: Abnormal liver function test.  Abnormal ultrasound.

EXAM:
MRI ABDOMEN WITHOUT AND WITH CONTRAST
TECHNIQUE: Multiplanar multisequence MR imaging of the abdomen was performed
both before and after the administration of intravenous contrast.
CONTRAST:  10mL MULTIHANCE GADOBENATE DIMEGLUMINE 529 MG/ML IV SOLN
Creatinine was obtained on site at [HOSPITAL] at [HOSPITAL].
Results: Creatinine 0.6 mg/dL.

[Series 3: T2 · coronal · 5.0mm · 1.56mm/px · 2 of 36 slices shown (1 of 4)]
[im 1/36]
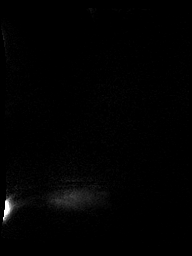
[im 36/36]
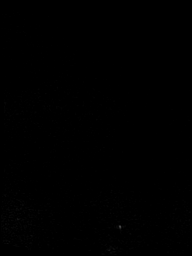

[Series 4: T1 · axial · 3.0mm · 1.19mm/px · z∈[-126,+87]mm · 6 of 144 slices shown]
[im 1/144]
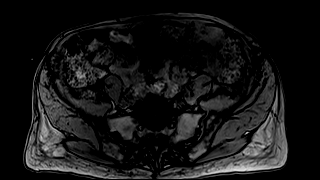
[im 29/144]
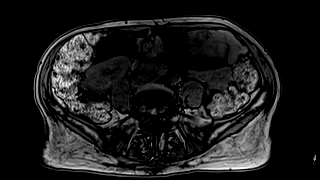
[im 58/144]
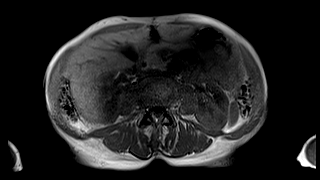
[im 86/144]
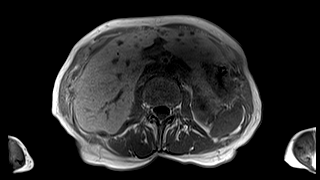
[im 115/144]
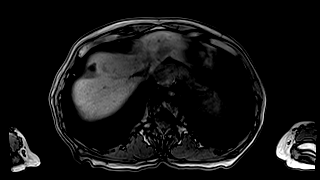
[im 144/144]
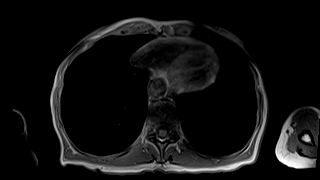

[Series 6: DWI · axial · 5.0mm · 1.16mm/px · z∈[-99,+111]mm · 4 of 108 slices shown (1 of 2)]
[im 1/108]
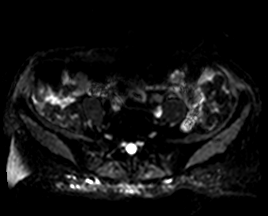
[im 36/108]
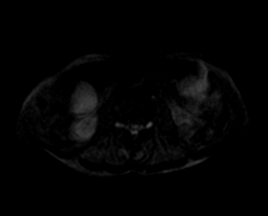
[im 72/108]
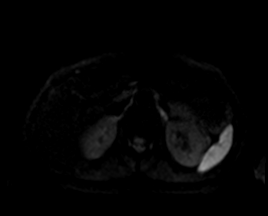
[im 108/108]
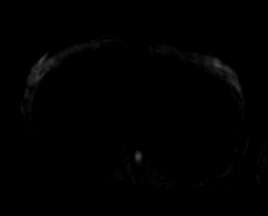

[Series 7: DWI · axial · 5.0mm · 1.16mm/px · 1 of 36 slices shown (2 of 2)]
[im 1/36]
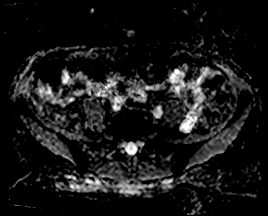

[Series 8: T2 · axial · 6.0mm · 1.19mm/px · 1 of 30 slices shown (2 of 4)]
[im 1/30]
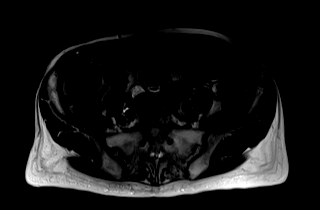

[Series 9: bSSFP · axial · 5.0mm · 1.18mm/px · 1 of 38 slices shown]
[im 1/38]
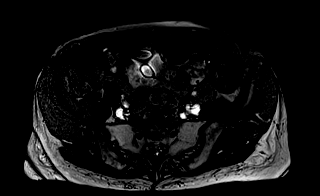

[Series 10: T2 · axial · 5.0mm · 1.21mm/px · z∈[-109,+131]mm · 2 of 41 slices shown (3 of 4)]
[im 1/41]
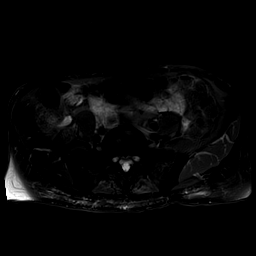
[im 41/41]
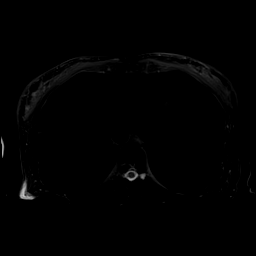

[Series 11: T2 · axial · 6.0mm · 1.19mm/px · 1 of 35 slices shown (4 of 4)]
[im 1/35]
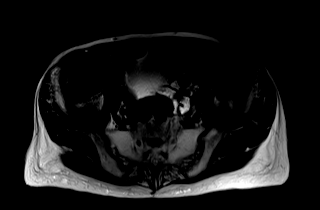

[Series 12: T1 dynamic · axial · non-contrast · 3.0mm · 0.97mm/px · z∈[-122,+115]mm · 3 of 80 slices shown]
[im 1/80]
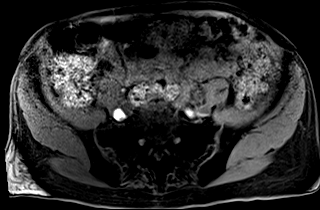
[im 40/80]
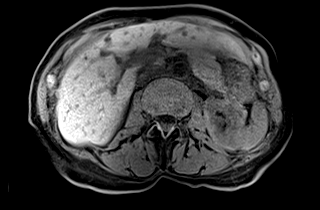
[im 80/80]
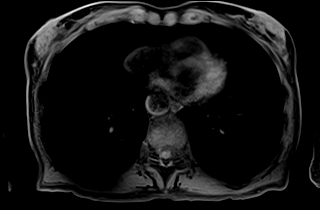

[Series 13: T1 dynamic post-contrast · axial · 3.0mm · 0.97mm/px · z∈[-122,+115]mm · 3 of 80 slices shown (1 of 9)]
[im 1/80]
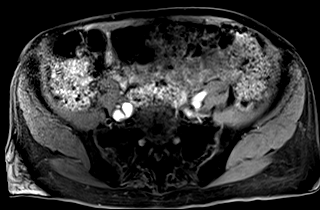
[im 40/80]
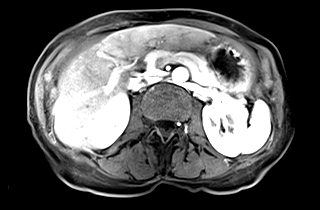
[im 80/80]
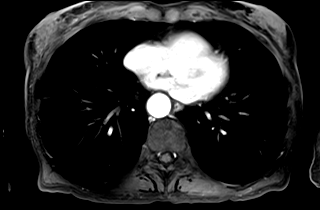

[Series 14: T1 dynamic post-contrast · axial · 3.0mm · 0.97mm/px · z∈[-122,+115]mm · 3 of 80 slices shown (2 of 9)]
[im 1/80]
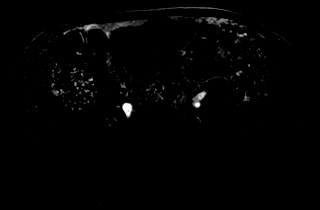
[im 40/80]
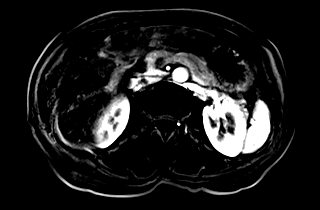
[im 80/80]
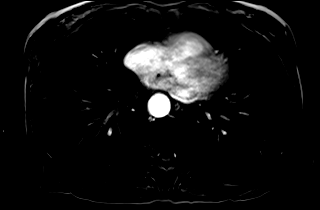

[Series 15: T1 dynamic post-contrast · axial · 3.0mm · 0.97mm/px · z∈[-122,+115]mm · 3 of 80 slices shown (3 of 9)]
[im 1/80]
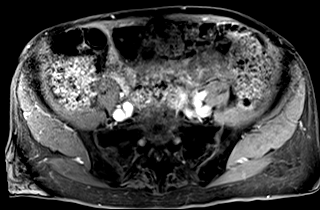
[im 40/80]
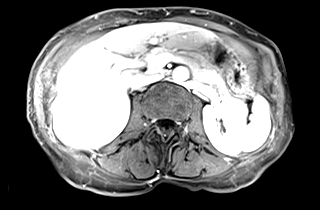
[im 80/80]
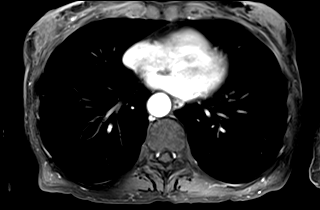

[Series 16: T1 dynamic post-contrast · axial · 3.0mm · 0.97mm/px · z∈[-122,+115]mm · 3 of 80 slices shown (4 of 9)]
[im 1/80]
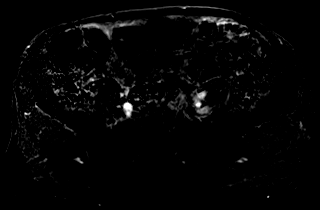
[im 40/80]
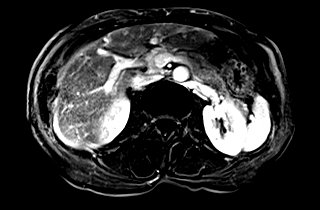
[im 80/80]
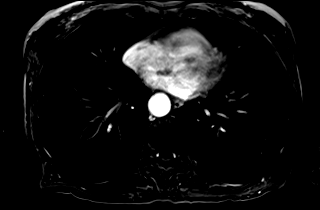

[Series 17: T1 dynamic post-contrast · axial · 3.0mm · 0.97mm/px · z∈[-122,+115]mm · 3 of 80 slices shown (5 of 9)]
[im 1/80]
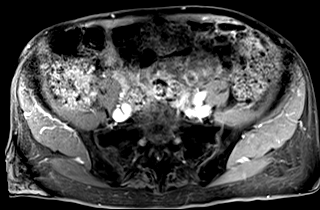
[im 40/80]
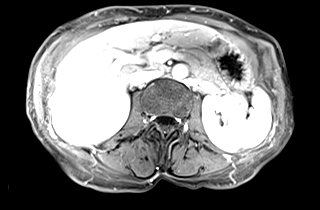
[im 80/80]
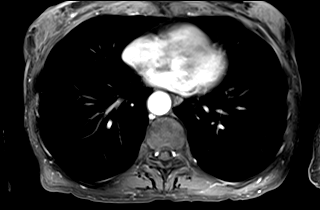

[Series 18: T1 dynamic post-contrast · axial · 3.0mm · 0.97mm/px · z∈[-122,+115]mm · 3 of 80 slices shown (6 of 9)]
[im 1/80]
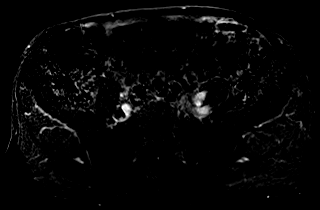
[im 40/80]
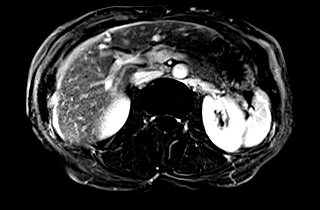
[im 80/80]
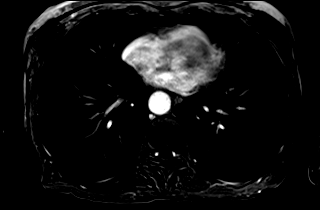

[Series 19: T1 dynamic post-contrast · coronal · 3.0mm · 0.97mm/px · 3 of 72 slices shown (7 of 9)]
[im 1/72]
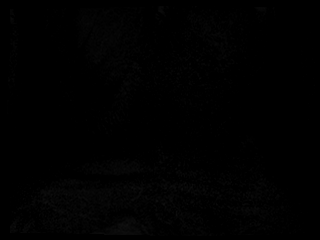
[im 36/72]
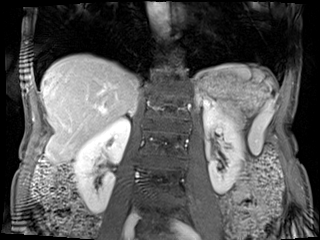
[im 72/72]
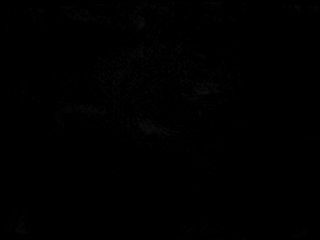

[Series 20: T1 dynamic post-contrast · axial · 3.0mm · 0.97mm/px · z∈[-122,+115]mm · 3 of 80 slices shown (8 of 9)]
[im 1/80]
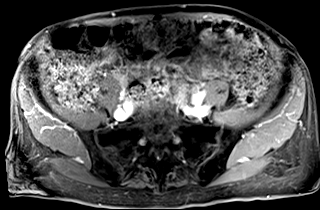
[im 40/80]
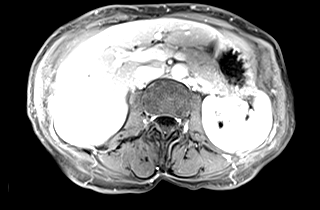
[im 80/80]
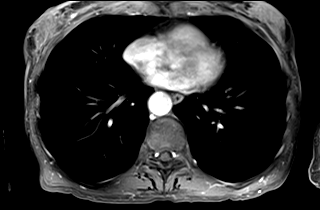

[Series 21: T1 dynamic post-contrast · axial · 3.0mm · 0.97mm/px · z∈[-122,+115]mm · 3 of 80 slices shown (9 of 9)]
[im 1/80]
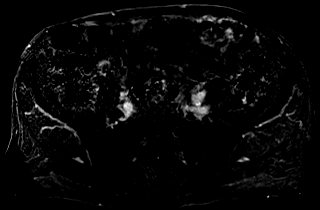
[im 40/80]
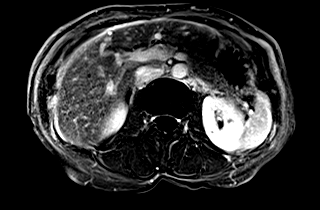
[im 80/80]
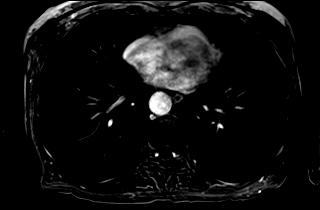

[48 of 48 positions shown; findings below may reference images not displayed]

FINDINGS: Lower chest: No acute findings.

Hepatobiliary: Multifocal liver cysts are identified involving both
lobes. Following the IV administration of contrast material no
enhancing liver abnormalities identified. The gallbladder is
unremarkable. No new significant biliary dilatation.

Pancreas: No mass, inflammatory changes, or other parenchymal
abnormality identified.

Spleen:  Within normal limits in size and appearance.

Adrenals/Urinary Tract: The adrenal glands are normal. Normal
appearance of the kidneys. No mass or hydronephrosis identified

Stomach/Bowel: Visualized portions within the abdomen are
unremarkable.

Vascular/Lymphatic: No pathologically enlarged lymph nodes
identified. No abdominal aortic aneurysm demonstrated.

Other:  None.

Musculoskeletal: No suspicious bone lesions identified.
IMPRESSION: 1. No suspicious liver abnormalities identified.
2. Liver cysts.

## 2018-01-07 DIAGNOSIS — S50819A Abrasion of unspecified forearm, initial encounter: Secondary | ICD-10-CM | POA: Diagnosis not present

## 2018-01-10 DIAGNOSIS — F411 Generalized anxiety disorder: Secondary | ICD-10-CM | POA: Diagnosis not present

## 2018-01-17 DIAGNOSIS — Z1231 Encounter for screening mammogram for malignant neoplasm of breast: Secondary | ICD-10-CM | POA: Diagnosis not present

## 2018-01-17 DIAGNOSIS — H903 Sensorineural hearing loss, bilateral: Secondary | ICD-10-CM | POA: Diagnosis not present

## 2018-01-18 DIAGNOSIS — F331 Major depressive disorder, recurrent, moderate: Secondary | ICD-10-CM | POA: Diagnosis not present

## 2018-01-18 DIAGNOSIS — Z79899 Other long term (current) drug therapy: Secondary | ICD-10-CM | POA: Diagnosis not present

## 2018-01-19 ENCOUNTER — Other Ambulatory Visit: Payer: Self-pay | Admitting: Internal Medicine

## 2018-01-19 ENCOUNTER — Ambulatory Visit
Admission: RE | Admit: 2018-01-19 | Discharge: 2018-01-19 | Disposition: A | Discharge status: Home / Self Care | Payer: PPO | Source: Ambulatory Visit | Attending: Internal Medicine | Admitting: Internal Medicine

## 2018-01-19 DIAGNOSIS — F5104 Psychophysiologic insomnia: Secondary | ICD-10-CM | POA: Diagnosis not present

## 2018-01-19 DIAGNOSIS — K5901 Slow transit constipation: Secondary | ICD-10-CM | POA: Diagnosis not present

## 2018-01-19 DIAGNOSIS — R899 Unspecified abnormal finding in specimens from other organs, systems and tissues: Secondary | ICD-10-CM | POA: Diagnosis not present

## 2018-01-19 DIAGNOSIS — Z23 Encounter for immunization: Secondary | ICD-10-CM | POA: Diagnosis not present

## 2018-01-19 DIAGNOSIS — T148XXA Other injury of unspecified body region, initial encounter: Secondary | ICD-10-CM | POA: Diagnosis not present

## 2018-01-19 DIAGNOSIS — R946 Abnormal results of thyroid function studies: Secondary | ICD-10-CM | POA: Diagnosis not present

## 2018-01-19 DIAGNOSIS — W108XXA Fall (on) (from) other stairs and steps, initial encounter: Secondary | ICD-10-CM | POA: Diagnosis not present

## 2018-01-19 DIAGNOSIS — S59912A Unspecified injury of left forearm, initial encounter: Secondary | ICD-10-CM | POA: Diagnosis not present

## 2018-02-02 ENCOUNTER — Ambulatory Visit (INDEPENDENT_AMBULATORY_CARE_PROVIDER_SITE_OTHER): Payer: PPO | Admitting: Neurology

## 2018-02-02 ENCOUNTER — Encounter: Payer: Self-pay | Admitting: Neurology

## 2018-02-02 VITALS — BP 120/72 | HR 111 | Ht 66.0 in | Wt 138.0 lb

## 2018-02-02 DIAGNOSIS — F3289 Other specified depressive episodes: Secondary | ICD-10-CM

## 2018-02-02 DIAGNOSIS — F32A Depression, unspecified: Secondary | ICD-10-CM | POA: Insufficient documentation

## 2018-02-02 DIAGNOSIS — F329 Major depressive disorder, single episode, unspecified: Secondary | ICD-10-CM | POA: Insufficient documentation

## 2018-02-02 DIAGNOSIS — F5104 Psychophysiologic insomnia: Secondary | ICD-10-CM

## 2018-02-02 NOTE — Progress Notes (Signed)
SLEEP MEDICINE CLINIC   Provider:  Larey Seat, [place] D  Primary Care Physician:  Lanice Shirts, MD   Referring Provider: Norma Fredrickson, MD    Chief Complaint  Patient presents with  . New Patient (Initial Visit)    pt here with husband, rm 60. pt has been struggling with insomnia since 2016 comes and goes.     HPI:  Angie Liu is a 72 y.o. female , seen here as in a referral from Dr. 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  His feeling was that her insomnia may cause some of the depression, or vice versa. He had discontinued Doxepin ( was started by dr Casimiro Needle on Nov 26th) as a sleep aid, and she developed rebound insomnia. She remained on 1 mg alprazolam and 1 mg prn at night. The patient had felt  to get Insomnia control on hydroxyzine.  Ambien and xanax remained  the sleep  inducers of choice.  She is currently doing well on 1/2 mg of Xanax up to twice a day and 100 mg of doxepin.  She also reported a fall at the [facility] around March 9th. She "banged " her head , but this had no effect on her sleep   Chief complaint according to patient :  Insomnia,( convoluted History)  Sleep habits are as follows: She has developed good sleep hygiene.  Bedtime for the couple is between 1030 and 1130.  The bedroom is void of electronics, it is cool, quiet and dark.  She sleeps on a flat mattress in a nonadjustable bed, she sleeps on her right side with one pillow for head support and one between the knees. She is not watching the clock but estimates about half an hour of sleep latency while on multiple sleep inducing medications.  She frequently wakes up shortly after 2 AM, and the arousal is not based on the urge to urinate or on discomfort or pain.  However this is a frequent time in the morning when her sleep is interrupted. She may wake up again between 5 and 7 AM anytime in between.  If she did not have a 2 AM break she would  be needing to go to the bathroom at this time anyway.  After that arousal she is usually not able to return to sleep and she will get up.  She does not nap in daytime either.  Average nocturnal sleep time can be between 5 and 7 hours. Husband noted neither sleep talking, no sleep walking, and no enactment. He describes her as quiet. No snoring, no movement.   Sleep medical history: She may have last slept well in 2015-16, under the care of Dr Jaynie Collins-  No medications were needed, but she had some depression periods before that time. At one time she took Lorazepam, probably until 2014.  Sister has alzheimer's. She recalled the week after her brother in law died to take care of her 3 years older sister, she was so very tired that she slept and took even naps. At the time developed depression and she was unable to sleep , couldn't tolerate Wellbutrin due to to tremors, affecting her Piano play.   Social history: married, Optometrist.   Review of Systems: Out of a complete 14 system review, the patient complains of only the following symptoms, and all other reviewed systems are negative.   Insomnia, very focussed on sleep.   Epworth score 0 , Fatigue severity  score 45  , depression score 6/ 15    Social History   Socioeconomic History  . Marital status: Married    Spouse name: Not on file  . Number of children: Not on file  . Years of education: Not on file  . Highest education level: Not on file  Occupational History  . Not on file  Social Needs  . Financial resource strain: Not on file  . Food insecurity:    Worry: Not on file    Inability: Not on file  . Transportation needs:    Medical: Not on file    Non-medical: Not on file  Tobacco Use  . Smoking status: Never Smoker  . Smokeless tobacco: Never Used  Substance and Sexual Activity  . Alcohol use: Yes  . Drug use: No  . Sexual activity: Not on file  Lifestyle  . Physical activity:    Days per week: Not on file     Minutes per session: Not on file  . Stress: Not on file  Relationships  . Social connections:    Talks on phone: Not on file    Gets together: Not on file    Attends religious service: Not on file    Active member of club or organization: Not on file    Attends meetings of clubs or organizations: Not on file    Relationship status: Not on file  . Intimate partner violence:    Fear of current or ex partner: Not on file    Emotionally abused: Not on file    Physically abused: Not on file    Forced sexual activity: Not on file  Other Topics Concern  . Not on file  Social History Narrative  . Not on file    Family History  Problem Relation Age of Onset  . Cancer Mother        colon  . Diabetes Father     Past Medical History:  Diagnosis Date  . Anxiety   . Depression   . Hayfever     Past Surgical History:  Procedure Laterality Date  . TONSILLECTOMY      Current Outpatient Medications  Medication Sig Dispense Refill  . ALPRAZolam (XANAX) 1 MG tablet Take 1 tablet by mouth at bedtime.    Marland Kitchen doxepin (SINEQUAN) 100 MG capsule Take 1 capsule by mouth at bedtime.    . Multiple Vitamins-Minerals (PRESERVISION AREDS PO) Take 1 tablet by mouth 2 (two) times daily.    Marland Kitchen OVER THE COUNTER MEDICATION 4 capsules daily. Calm CP, takes 1 breakfast, 1 at dinner and 2 at bedtime    . OVER THE COUNTER MEDICATION 1 capsule at bedtime. Kavinace capusle    . OVER THE COUNTER MEDICATION Place 1 tablet under the tongue every morning. [place]    . OVER THE COUNTER MEDICATION Place 1 tablet under the tongue. Butyra    . Progesterone Micronized (PROGESTERONE PO) Take 2 capsules by mouth at bedtime. 1:1 SR compound medication at [place].    Marland Kitchen zolpidem (AMBIEN CR) 12.5 MG CR tablet      No current facility-administered medications for this visit.     Allergies as of 02/02/2018 - Review Complete 02/02/2018  Allergen Reaction Noted  . Latex Rash 01/20/2012    Vitals: BP 120/72   Pulse (!)  111   Ht 5\' 6"  (1.676 m)   Wt 138 lb (62.6 kg)   BMI 22.27 kg/m  Last Weight:  Wt Readings from Last 1 Encounters:  02/02/18 138 lb (62.6 kg)   PPJ:KDTO mass index is 22.27 kg/m.     Last Height:   Ht Readings from Last 1 Encounters:  02/02/18 5\' 6"  (1.676 m)    Physical exam:  General: The patient is awake, alert and appears not in acute distress. The patient is well groomed. Head: Normocephalic, atraumatic. Neck is supple. Mallampati 2-3 ,  neck circumference:12 ". Nasal airflow patent , . Retrognathia is not seen.  Cardiovascular:  Regular rate and rhythm - without  murmurs or carotid bruit, and without distended neck veins. Respiratory: Lungs are clear to auscultation. Skin:  Without evidence of edema, or rash Trunk: BMI is 22.27 . 3 month ago she was 110 pounds, now 130.  The patient's posture is erect  Neurologic exam : The patient is awake and alert, oriented to place and time.   Attention span & concentration ability appears normal.  Speech is fluent,  without dysarthria, dysphonia or aphasia.  Mood and affect are appropriate.  Cranial nerves: Pupils are equal and briskly reactive to light.  Funduscopic exam without evidence of pallor or edema. Extraocular movements  in vertical and horizontal planes intact and without nystagmus. Visual fields by finger perimetry are intact. Hearing to finger rub intact. Facial sensation intact to fine touch.Facial motor strength is symmetric  But facial mimic is decreased.  Tongue and uvula move midline. Shoulder shrug was symmetrical.   Motor exam:  Normal tone, muscle bulk and symmetric strength in all extremities. Sensory:  Fine touch, pinprick and vibration were tested in all extremities.  Proprioception tested in the upper extremities was normal. Coordination: Rapid alternating movements in the fingers/hands was normal.  Finger-to-nose maneuver  normal without evidence of ataxia, mild  dysmetria and target - tremor. No tremor at  rest.  Gait and station: Patient walks without assistive device and is able unassisted to climb up to the exam table. Strength within normal limits. Stance is stable and normal.  Tandem gait is unfragmented.  She can stand on either lag alone. Turns with 4 Steps. Romberg testing is \\negative . normal arm swing. No tremor.  Her trunk/ torso appears rigid.  Deep tendon reflexes: in the  upper and lower extremities are symmetric and intact.   Assessment:  After physical and neurologic examination, review of laboratory studies,  Personal review of imaging studies, reports of other /same  Imaging studies, results of polysomnography and / or neurophysiology testing and pre-existing records as far as provided in visit., my assessment is   1) Mrs. Soder has had episodic problems with sleep induction, depression and may be anxiety in the past.  At one time she was treated with lorazepam for a long period of time she was able to wean off and try natural supplements only, successful for about 12-15 months in the year 2015 or 16 at the time of her. Brother-in-law's death and the interval care taking of her sister she developed again more depressive symptoms, but feels that these have now left her and that depression has mainly resolved.  However she did use sleep inducing medications soon after that experience, and by now has probably for 2 years consistently needed some kind of sleep aid.  At the time now it is a mixture of doxepin, and extended release form of Ambien 12.5 mg and 1/2 mg of Xanax. She uses another 1/2 mg when waking up middle of the night.   There has been no organic sleep disorder known to affect her. Her husband is  under the impression that she remains depressed. Her affect and demeanor is depressed. She is not going out, stays at home. She is reluctant to even visit the grandchildren !  She may not have a high yield by obtaining a sleep study, but  I offered it to her. I cannot say that I  expect to find any organic sleep disorder.  Chronic insomnia.     Larey Seat, MD 4/0/0867, 6:19 PM  Certified in Neurology by ABPN Certified in [place] by [facility] [address], [place], [place] [address]

## 2018-02-02 NOTE — Patient Instructions (Signed)

## 2018-02-06 ENCOUNTER — Telehealth: Payer: Self-pay

## 2018-02-06 ENCOUNTER — Other Ambulatory Visit: Payer: Self-pay | Admitting: Neurology

## 2018-02-06 DIAGNOSIS — F5104 Psychophysiologic insomnia: Secondary | ICD-10-CM

## 2018-02-06 NOTE — Telephone Encounter (Signed)
[facility] [employer] denied in lab sleeps tudy, Need HST order

## 2018-02-06 NOTE — Telephone Encounter (Signed)
Order placed

## 2018-02-06 NOTE — Telephone Encounter (Signed)
This patient has insomnia issues- how is a HST solving those questions?

## 2018-02-07 NOTE — Telephone Encounter (Signed)
She will probably do better on HST due to at home she can sleep whenever. I will use watchpat on her and have her put it on early in evening to capture when she sleeps in periods.

## 2018-02-09 DIAGNOSIS — F411 Generalized anxiety disorder: Secondary | ICD-10-CM | POA: Diagnosis not present

## 2018-02-21 DIAGNOSIS — H353132 Nonexudative age-related macular degeneration, bilateral, intermediate dry stage: Secondary | ICD-10-CM | POA: Diagnosis not present

## 2018-02-21 DIAGNOSIS — D3132 Benign neoplasm of left choroid: Secondary | ICD-10-CM | POA: Diagnosis not present

## 2018-02-21 DIAGNOSIS — Z961 Presence of intraocular lens: Secondary | ICD-10-CM | POA: Diagnosis not present

## 2018-03-06 ENCOUNTER — Ambulatory Visit (INDEPENDENT_AMBULATORY_CARE_PROVIDER_SITE_OTHER): Payer: PPO | Admitting: Neurology

## 2018-03-06 DIAGNOSIS — G4710 Hypersomnia, unspecified: Secondary | ICD-10-CM

## 2018-03-06 DIAGNOSIS — F5104 Psychophysiologic insomnia: Secondary | ICD-10-CM

## 2018-03-07 DIAGNOSIS — F411 Generalized anxiety disorder: Secondary | ICD-10-CM | POA: Diagnosis not present

## 2018-03-09 ENCOUNTER — Ambulatory Visit: Payer: PPO | Admitting: Podiatry

## 2018-03-10 ENCOUNTER — Ambulatory Visit: Payer: PPO

## 2018-03-10 ENCOUNTER — Encounter: Payer: Self-pay | Admitting: Podiatry

## 2018-03-10 ENCOUNTER — Ambulatory Visit: Payer: PPO | Admitting: Podiatry

## 2018-03-10 DIAGNOSIS — M722 Plantar fascial fibromatosis: Secondary | ICD-10-CM

## 2018-03-10 DIAGNOSIS — I739 Peripheral vascular disease, unspecified: Secondary | ICD-10-CM

## 2018-03-10 DIAGNOSIS — R609 Edema, unspecified: Secondary | ICD-10-CM

## 2018-03-10 NOTE — Procedures (Signed)
[facility] [place],  [address] NAME:  Angie Liu                                                                DOB: 04-12-1946 MEDICAL RECORD NUMBER [id number]                                           DOS:  03/06/2018 REFERRING PHYSICIAN: Emi Belfast, MD and Norma Fredrickson, M.D. STUDY PERFORMED: Home Sleep Study on Watch Pat HISTORY: Angie Liu is a 72 y.o. female patient, seen here as in a referral from Dr. 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 His feeling was that her insomnia may cause some of the depression, or vice versa.   He had discontinued Doxepin (had started by Dr. Casimiro Needle on Nov 26th) as a sleep aid, and she developed rebound insomnia. The patient had failed to achieve Insomnia control on hydroxyzine, and Ambien and Xanax remained the sleep inducers of choice. She is currently doing well on 1/2 mg of Xanax up to twice a day and 100 mg of doxepin. She had previously tried Wellbutrin for depression, but developed tremors .She also reported a fall at the [facility] around March 9th. She "banged" her head, but this had no effect on her sleep.  Patient felt that she would not sleep well in a laboratory and was requesting a HST.    Chief complaint according to patient: Insomnia (convoluted History). Epworth score 0, Fatigue severity score 45 points, geriatric depression score endorsed at 6/ 15. BMI: 22.3  STUDY RESULTS:  Total Recording Time: 6 hours, 25 minutes and valid test time was 5 h and 41 min.  Total Apnea/Hypopnea Index (AHI): 5.0/h; RDI: 5.0 /h Average Oxygen Saturation: 96%; Lowest Oxygen Saturation: 94 %, no desaturation.  Average Heart Rate: 80 bpm (between 74 and 101 bpm). Watch Pat estimated a REM sleep percentage of 26.4% and equals the valid test time as total sleep time. IMPRESSION: There is no evidence of hypoxemia, cardiac arrhythmia or clinically  significant Sleep Disordered Breathing. I cannot answer to sleep architecture or REM sleep related sleep disorders, nor PLMs. The study result is suggestive of non- organic causes of insomnia or abnormal sleep perception.   RECOMMENDATION: Cognitive behavior therapy for Insomnia.  I certify that I have reviewed the raw data recording prior to the issuance of this report in accordance with the standards of the American Academy of Sleep Medicine (AASM).  Larey Seat, M.D.    03-10-2018       Medical Director of [facility] at [facility], accredited by the AASM. Diplomat of the ABPN and ABSM.

## 2018-03-12 NOTE — Progress Notes (Signed)
Subjective: 72 year old female presents the office today for concerns of chronic swelling to her feet with the right side worse than the left.  She states that she will occasionally get some intermittent areas of discomfort as well as some nerve/burning symptoms into the toes which is been ongoing the last several months.  She states this started in the fall and has been intermittent.  She had swelling to her legs before that.  Denies any redness to her feet denies any open sores. Denies any systemic complaints such as fevers, chills, nausea, vomiting. No acute changes since last appointment, and no other complaints at this time.   Objective: AAO x3, NAD DP/PT pulses palpable bilaterally, CRT less than 3 seconds There is chronic appearing swelling to the bilateral ankles and feet but there is no erythema or increase in warmth.  There is area of tenderness and unable to elicit today.  Ankle, subtalar joint range of motion intact.  Mild varicose veins are present.  No open lesions or pre-ulcerative lesions.  No pain with calf compression, swelling, warmth, erythema  Assessment: 72 year old female with chronic swelling bilaterally  Plan: -All treatment options discussed with the patient including all alternatives, risks, complications.  -At this time she currently is not experience any pain or any burning to her feet.  I do think that she can get this from the swelling.  Discussed etiologies of swelling and will order a venous reflux study to start.  She declined x-rays today. -I did an ABI in the office today which was normal -Patient encouraged to call the office with any questions, concerns, change in symptoms.   Trula Slade DPM

## 2018-03-13 ENCOUNTER — Telehealth: Payer: Self-pay | Admitting: Neurology

## 2018-03-13 DIAGNOSIS — F5104 Psychophysiologic insomnia: Secondary | ICD-10-CM

## 2018-03-13 NOTE — Telephone Encounter (Signed)
Called and went over the patient's sleep study with her. The home sleep test that was completed did not show any evidence of sleep apnea, hypoxemia, cardiac arrythmia, or sleep disordered breathing. The study showed non organic causes of insomnia or abnormal sleep perception. I have informed the pt that Dr Brett Fairy recommends that the pt have a referral for cognitive behavior therapy to help with treatment of insomnia. Pt was reluctant at first so I offered a follow up apt with Dr Brett Fairy to discuss further. Pt declined apt with Dr Brett Fairy and agreed to attempt and go at least once to cognitive behavior therapy. I have placed the referral for the pt and informed her that they will contact her to get her scheduled. Pt verbalized understanding.

## 2018-03-13 NOTE — Telephone Encounter (Signed)
Pt called wanting to inform Dr. Brett Fairy that last night she could only sleep from 11:30 through 3:30.

## 2018-03-17 ENCOUNTER — Telehealth: Payer: Self-pay | Admitting: *Deleted

## 2018-03-17 ENCOUNTER — Ambulatory Visit: Payer: PPO | Admitting: Podiatry

## 2018-03-17 DIAGNOSIS — I739 Peripheral vascular disease, unspecified: Secondary | ICD-10-CM

## 2018-03-17 DIAGNOSIS — R609 Edema, unspecified: Secondary | ICD-10-CM

## 2018-03-17 NOTE — Telephone Encounter (Addendum)
Dr. Jacqualyn Posey ordered Venous Reflux B/L for painful swelling. I informed pt of Dr. Leigh Aurora orders and pt requested I mail directions to her home address. Mailed list of referral agencies with [facility] underlined and *. Faxed required form, clinicals and demographics [facility]. Faxed orders to [facility].

## 2018-03-20 ENCOUNTER — Telehealth: Payer: Self-pay | Admitting: Podiatry

## 2018-03-20 NOTE — Telephone Encounter (Signed)
Patient wants to know what exactly is [facility] will be doing at her appointment with them. If you can call her back at [phone]

## 2018-03-20 NOTE — Telephone Encounter (Signed)
I told pt [facility] would be performing a ultrasound, non-invasive procedure.

## 2018-03-23 ENCOUNTER — Ambulatory Visit: Payer: PPO | Admitting: Podiatry

## 2018-04-04 ENCOUNTER — Ambulatory Visit (HOSPITAL_COMMUNITY)
Admission: RE | Admit: 2018-04-04 | Discharge: 2018-04-04 | Disposition: A | Discharge status: Home / Self Care | Payer: PPO | Source: Ambulatory Visit | Attending: Vascular Surgery | Admitting: Vascular Surgery

## 2018-04-04 DIAGNOSIS — R609 Edema, unspecified: Secondary | ICD-10-CM | POA: Diagnosis not present

## 2018-04-04 DIAGNOSIS — I872 Venous insufficiency (chronic) (peripheral): Secondary | ICD-10-CM | POA: Insufficient documentation

## 2018-04-04 DIAGNOSIS — I739 Peripheral vascular disease, unspecified: Secondary | ICD-10-CM

## 2018-04-07 ENCOUNTER — Telehealth: Payer: Self-pay | Admitting: Podiatry

## 2018-04-07 ENCOUNTER — Other Ambulatory Visit: Payer: Self-pay

## 2018-04-07 DIAGNOSIS — R609 Edema, unspecified: Secondary | ICD-10-CM

## 2018-04-07 DIAGNOSIS — I739 Peripheral vascular disease, unspecified: Secondary | ICD-10-CM

## 2018-04-07 NOTE — Progress Notes (Signed)
Spoke with patient in regards to Dr Leigh Aurora orders for referral to [facility]. We are referring to [facility]/ Dr Renaldo Reel. We also discussed her vascular results

## 2018-04-07 NOTE — Telephone Encounter (Signed)
I'm a pt of Dr. Leigh Aurora and I went to [facility] on Tuesday for an ultrasound. Just calling before the weekend to see if Dr. Jacqualyn Posey has had a chance to review the results? Does he have any recommendations and does he want me to come back in to his office? Thank you. My number is [phone]. Thank you.

## 2018-04-07 NOTE — Telephone Encounter (Signed)
Spoke with patient regarding vascular results and orders from Dr Jacqualyn Posey. Referral to [facility]/ Dr Renaldo Reel

## 2018-04-26 DIAGNOSIS — R600 Localized edema: Secondary | ICD-10-CM | POA: Diagnosis not present

## 2018-05-12 DIAGNOSIS — F411 Generalized anxiety disorder: Secondary | ICD-10-CM | POA: Diagnosis not present

## 2018-05-25 DIAGNOSIS — N951 Menopausal and female climacteric states: Secondary | ICD-10-CM | POA: Diagnosis not present

## 2018-06-15 DIAGNOSIS — D2271 Melanocytic nevi of right lower limb, including hip: Secondary | ICD-10-CM | POA: Diagnosis not present

## 2018-06-15 DIAGNOSIS — Z85828 Personal history of other malignant neoplasm of skin: Secondary | ICD-10-CM | POA: Diagnosis not present

## 2018-06-15 DIAGNOSIS — D2272 Melanocytic nevi of left lower limb, including hip: Secondary | ICD-10-CM | POA: Diagnosis not present

## 2018-06-15 DIAGNOSIS — D485 Neoplasm of uncertain behavior of skin: Secondary | ICD-10-CM | POA: Diagnosis not present

## 2018-06-15 DIAGNOSIS — L308 Other specified dermatitis: Secondary | ICD-10-CM | POA: Diagnosis not present

## 2018-06-15 DIAGNOSIS — L821 Other seborrheic keratosis: Secondary | ICD-10-CM | POA: Diagnosis not present

## 2018-06-15 DIAGNOSIS — D1801 Hemangioma of skin and subcutaneous tissue: Secondary | ICD-10-CM | POA: Diagnosis not present

## 2018-06-15 DIAGNOSIS — L814 Other melanin hyperpigmentation: Secondary | ICD-10-CM | POA: Diagnosis not present

## 2018-06-15 DIAGNOSIS — D225 Melanocytic nevi of trunk: Secondary | ICD-10-CM | POA: Diagnosis not present

## 2018-06-15 DIAGNOSIS — D692 Other nonthrombocytopenic purpura: Secondary | ICD-10-CM | POA: Diagnosis not present

## 2018-06-15 DIAGNOSIS — L2089 Other atopic dermatitis: Secondary | ICD-10-CM | POA: Diagnosis not present

## 2018-06-21 DIAGNOSIS — N951 Menopausal and female climacteric states: Secondary | ICD-10-CM | POA: Diagnosis not present

## 2018-06-26 DIAGNOSIS — N951 Menopausal and female climacteric states: Secondary | ICD-10-CM | POA: Diagnosis not present

## 2018-06-28 DIAGNOSIS — Z974 Presence of external hearing-aid: Secondary | ICD-10-CM | POA: Diagnosis not present

## 2018-06-28 DIAGNOSIS — H9311 Tinnitus, right ear: Secondary | ICD-10-CM | POA: Diagnosis not present

## 2018-06-28 DIAGNOSIS — R7989 Other specified abnormal findings of blood chemistry: Secondary | ICD-10-CM | POA: Diagnosis not present

## 2018-06-28 DIAGNOSIS — H6121 Impacted cerumen, right ear: Secondary | ICD-10-CM | POA: Diagnosis not present

## 2018-06-28 DIAGNOSIS — R5383 Other fatigue: Secondary | ICD-10-CM | POA: Diagnosis not present

## 2018-06-28 DIAGNOSIS — E559 Vitamin D deficiency, unspecified: Secondary | ICD-10-CM | POA: Diagnosis not present

## 2018-06-28 DIAGNOSIS — Z7289 Other problems related to lifestyle: Secondary | ICD-10-CM | POA: Diagnosis not present

## 2018-06-28 DIAGNOSIS — H9113 Presbycusis, bilateral: Secondary | ICD-10-CM | POA: Diagnosis not present

## 2018-06-28 DIAGNOSIS — N951 Menopausal and female climacteric states: Secondary | ICD-10-CM | POA: Diagnosis not present

## 2018-06-28 DIAGNOSIS — H938X2 Other specified disorders of left ear: Secondary | ICD-10-CM | POA: Diagnosis not present

## 2018-07-04 DIAGNOSIS — N951 Menopausal and female climacteric states: Secondary | ICD-10-CM | POA: Diagnosis not present

## 2018-07-15 ENCOUNTER — Encounter: Payer: Self-pay | Admitting: *Deleted

## 2018-07-24 DIAGNOSIS — N951 Menopausal and female climacteric states: Secondary | ICD-10-CM | POA: Diagnosis not present

## 2018-08-03 ENCOUNTER — Ambulatory Visit: Payer: PPO | Admitting: Psychiatry

## 2018-08-03 ENCOUNTER — Encounter: Payer: Self-pay | Admitting: Psychiatry

## 2018-08-03 VITALS — BP 121/77 | HR 95

## 2018-08-03 DIAGNOSIS — F411 Generalized anxiety disorder: Secondary | ICD-10-CM

## 2018-08-03 DIAGNOSIS — F5101 Primary insomnia: Secondary | ICD-10-CM

## 2018-08-03 DIAGNOSIS — F3342 Major depressive disorder, recurrent, in full remission: Secondary | ICD-10-CM

## 2018-08-03 MED ORDER — ZOLPIDEM TARTRATE ER 12.5 MG PO TBCR: 12.5000 mg | EXTENDED_RELEASE_TABLET | Freq: Every day | ORAL | 1 refills | Status: DC

## 2018-08-03 MED ORDER — DOXEPIN HCL 100 MG PO CAPS: 100.0000 mg | ORAL_CAPSULE | Freq: Every day | ORAL | 2 refills | Status: DC

## 2018-08-03 MED ORDER — ALPRAZOLAM 0.5 MG PO TABS: ORAL_TABLET | ORAL | 1 refills | Status: DC

## 2018-08-03 NOTE — Progress Notes (Signed)
Angie Liu [id number] 16-Sep-1946 72 y.o.  Assessment: Plan:    Primary insomnia - Plan: ALPRAZolam (XANAX) 0.5 MG tablet, zolpidem (AMBIEN CR) 12.5 MG CR tablet, doxepin (SINEQUAN) 100 MG capsule  Generalized anxiety disorder - Plan: ALPRAZolam (XANAX) 0.5 MG tablet  Major depressive disorder, recurrent episode, in full remission ([place]) - Plan: doxepin (SINEQUAN) 100 MG capsule Depression and anxiety significantly improved. Insomnia is stable   Please see After Visit Summary for patient specific instructions.  Future Appointments  Date Time Provider [place]  02/02/2019 11:30 AM Thayer Headings, PMHNP [facility] None    No orders of the defined types were placed in this encounter.      Subjective:   Patient ID:  Angie Liu is a 72 y.o. (DOB 28-Jul-1946) female.  Chief Complaint:  Chief Complaint  Patient presents with  . Insomnia  . Depression  . Anxiety    HPI Angie Liu presents to the office today for f/u of insomnia, anixety, and depression. She reports that she has been doing well and enjoying some recent activities and looking forward to dinner out and upcoming birthday. Husband recently traveled and she stayed home, and describes doing well in his absence whereas in the past she has had increased anxiety and depression when he has been away. She reports adequate sleep. "I'm doing very well. Denies depressed mood. Reports experiencing some anxiety with highway driving when others were driving erratically. Denies any other anxiety. Reports energy and motivation has been good and has been exercising regularly in the morning. Denies difficulty with concentration. Denies SI.   Medications: I have reviewed the patient's current medications. Denies side effects.  Allergies:  Allergies  Allergen Reactions  . Latex Rash    Past Medical History:  Diagnosis Date  . Anxiety   . Depression   . Hayfever     Past Surgical History:  Procedure Laterality Date  .  TONSILLECTOMY      Family History  Problem Relation Age of Onset  . Cancer Mother        colon  . Depression Mother   . Diabetes Father   . Depression Father   . Depression Son   . Depression Paternal Uncle     Social History   Socioeconomic History  . Marital status: Married    Spouse name: Not on file  . Number of children: Not on file  . Years of education: Not on file  . Highest education level: Not on file  Occupational History  . Not on file  Social Needs  . Financial resource strain: Not on file  . Food insecurity:    Worry: Not on file    Inability: Not on file  . Transportation needs:    Medical: Not on file    Non-medical: Not on file  Tobacco Use  . Smoking status: Never Smoker  . Smokeless tobacco: Never Used  Substance and Sexual Activity  . Alcohol use: Yes  . Drug use: No  . Sexual activity: Not on file  Lifestyle  . Physical activity:    Days per week: Not on file    Minutes per session: Not on file  . Stress: Not on file  Relationships  . Social connections:    Talks on phone: Not on file    Gets together: Not on file    Attends religious service: Not on file    Active member of club or organization: Not on file    Attends meetings of  clubs or organizations: Not on file    Relationship status: Not on file  . Intimate partner violence:    Fear of current or ex partner: Not on file    Emotionally abused: Not on file    Physically abused: Not on file    Forced sexual activity: Not on file  Other Topics Concern  . Not on file  Social History Narrative  . Not on file    Past Medical History, Surgical history, Social history, and Family history were reviewed and updated as appropriate.   Please see review of systems for further details on the patient's review from today.   Review of Systems:  Review of Systems  Constitutional: Negative for activity change, appetite change, fatigue and unexpected weight change.  Cardiovascular: Positive  for leg swelling.       Reports LE edema has improved  Musculoskeletal: Negative for gait problem.  Skin: Positive for rash.  Neurological: Negative for tremors.  Psychiatric/Behavioral: Negative for agitation, behavioral problems, confusion, decreased concentration, dysphoric mood, hallucinations, sleep disturbance and suicidal ideas. The patient is not nervous/anxious and is not hyperactive.     Objective:   Physical Exam:  BP 121/77   Pulse 95   Physical Exam  Constitutional: She is oriented to person, place, and time. She appears well-developed. No distress.  Musculoskeletal: Normal range of motion.  Neurological: She is alert and oriented to person, place, and time. Coordination normal.  Psychiatric: She has a normal mood and affect. Her speech is normal and behavior is normal. Judgment and thought content normal. Her mood appears not anxious. Her affect is not angry, not blunt, not labile and not inappropriate. Cognition and memory are normal. She does not exhibit a depressed mood. She expresses no suicidal ideation.    Lab Review:  No results found for: NA, K, CL, CO2, GLUCOSE, BUN, CREATININE, CALCIUM, PROT, ALBUMIN, AST, ALT, ALKPHOS, BILITOT, GFRNONAA, GFRAA  No results found for: WBC, RBC, HGB, HCT, PLT, MCV, MCH, MCHC, RDW, LYMPHSABS, MONOABS, EOSABS, BASOSABS -------------------------------

## 2018-08-22 DIAGNOSIS — H52203 Unspecified astigmatism, bilateral: Secondary | ICD-10-CM | POA: Diagnosis not present

## 2018-08-22 DIAGNOSIS — Z961 Presence of intraocular lens: Secondary | ICD-10-CM | POA: Diagnosis not present

## 2018-08-22 DIAGNOSIS — D3132 Benign neoplasm of left choroid: Secondary | ICD-10-CM | POA: Diagnosis not present

## 2018-08-22 DIAGNOSIS — H353132 Nonexudative age-related macular degeneration, bilateral, intermediate dry stage: Secondary | ICD-10-CM | POA: Diagnosis not present

## 2018-10-16 DIAGNOSIS — M9902 Segmental and somatic dysfunction of thoracic region: Secondary | ICD-10-CM | POA: Diagnosis not present

## 2018-10-16 DIAGNOSIS — M9903 Segmental and somatic dysfunction of lumbar region: Secondary | ICD-10-CM | POA: Diagnosis not present

## 2018-10-16 DIAGNOSIS — M9904 Segmental and somatic dysfunction of sacral region: Secondary | ICD-10-CM | POA: Diagnosis not present

## 2018-10-16 DIAGNOSIS — M9901 Segmental and somatic dysfunction of cervical region: Secondary | ICD-10-CM | POA: Diagnosis not present

## 2018-10-16 DIAGNOSIS — M5030 Other cervical disc degeneration, unspecified cervical region: Secondary | ICD-10-CM | POA: Diagnosis not present

## 2018-10-16 DIAGNOSIS — M5136 Other intervertebral disc degeneration, lumbar region: Secondary | ICD-10-CM | POA: Diagnosis not present

## 2018-10-18 DIAGNOSIS — M9903 Segmental and somatic dysfunction of lumbar region: Secondary | ICD-10-CM | POA: Diagnosis not present

## 2018-10-18 DIAGNOSIS — M5030 Other cervical disc degeneration, unspecified cervical region: Secondary | ICD-10-CM | POA: Diagnosis not present

## 2018-10-18 DIAGNOSIS — M9901 Segmental and somatic dysfunction of cervical region: Secondary | ICD-10-CM | POA: Diagnosis not present

## 2018-10-18 DIAGNOSIS — M9904 Segmental and somatic dysfunction of sacral region: Secondary | ICD-10-CM | POA: Diagnosis not present

## 2018-10-18 DIAGNOSIS — M9902 Segmental and somatic dysfunction of thoracic region: Secondary | ICD-10-CM | POA: Diagnosis not present

## 2018-10-18 DIAGNOSIS — M5136 Other intervertebral disc degeneration, lumbar region: Secondary | ICD-10-CM | POA: Diagnosis not present

## 2018-10-20 DIAGNOSIS — M5136 Other intervertebral disc degeneration, lumbar region: Secondary | ICD-10-CM | POA: Diagnosis not present

## 2018-10-20 DIAGNOSIS — M9902 Segmental and somatic dysfunction of thoracic region: Secondary | ICD-10-CM | POA: Diagnosis not present

## 2018-10-20 DIAGNOSIS — M9903 Segmental and somatic dysfunction of lumbar region: Secondary | ICD-10-CM | POA: Diagnosis not present

## 2018-10-20 DIAGNOSIS — M9901 Segmental and somatic dysfunction of cervical region: Secondary | ICD-10-CM | POA: Diagnosis not present

## 2018-10-20 DIAGNOSIS — M5030 Other cervical disc degeneration, unspecified cervical region: Secondary | ICD-10-CM | POA: Diagnosis not present

## 2018-10-20 DIAGNOSIS — M9904 Segmental and somatic dysfunction of sacral region: Secondary | ICD-10-CM | POA: Diagnosis not present

## 2018-11-09 DIAGNOSIS — H9113 Presbycusis, bilateral: Secondary | ICD-10-CM | POA: Diagnosis not present

## 2018-11-09 DIAGNOSIS — H9313 Tinnitus, bilateral: Secondary | ICD-10-CM | POA: Diagnosis not present

## 2018-11-09 DIAGNOSIS — H6123 Impacted cerumen, bilateral: Secondary | ICD-10-CM | POA: Diagnosis not present

## 2018-11-09 DIAGNOSIS — H903 Sensorineural hearing loss, bilateral: Secondary | ICD-10-CM | POA: Diagnosis not present

## 2018-11-28 ENCOUNTER — Encounter: Payer: Self-pay | Admitting: Emergency Medicine

## 2018-11-28 DIAGNOSIS — F411 Generalized anxiety disorder: Secondary | ICD-10-CM | POA: Insufficient documentation

## 2018-11-28 DIAGNOSIS — F3341 Major depressive disorder, recurrent, in partial remission: Secondary | ICD-10-CM | POA: Insufficient documentation

## 2018-12-08 DIAGNOSIS — M9901 Segmental and somatic dysfunction of cervical region: Secondary | ICD-10-CM | POA: Diagnosis not present

## 2018-12-08 DIAGNOSIS — M5136 Other intervertebral disc degeneration, lumbar region: Secondary | ICD-10-CM | POA: Diagnosis not present

## 2018-12-08 DIAGNOSIS — M5030 Other cervical disc degeneration, unspecified cervical region: Secondary | ICD-10-CM | POA: Diagnosis not present

## 2018-12-08 DIAGNOSIS — M9902 Segmental and somatic dysfunction of thoracic region: Secondary | ICD-10-CM | POA: Diagnosis not present

## 2018-12-08 DIAGNOSIS — M9903 Segmental and somatic dysfunction of lumbar region: Secondary | ICD-10-CM | POA: Diagnosis not present

## 2018-12-08 DIAGNOSIS — M9904 Segmental and somatic dysfunction of sacral region: Secondary | ICD-10-CM | POA: Diagnosis not present

## 2018-12-11 DIAGNOSIS — M5136 Other intervertebral disc degeneration, lumbar region: Secondary | ICD-10-CM | POA: Diagnosis not present

## 2018-12-11 DIAGNOSIS — M9901 Segmental and somatic dysfunction of cervical region: Secondary | ICD-10-CM | POA: Diagnosis not present

## 2018-12-11 DIAGNOSIS — M9902 Segmental and somatic dysfunction of thoracic region: Secondary | ICD-10-CM | POA: Diagnosis not present

## 2018-12-11 DIAGNOSIS — M9904 Segmental and somatic dysfunction of sacral region: Secondary | ICD-10-CM | POA: Diagnosis not present

## 2018-12-11 DIAGNOSIS — M5030 Other cervical disc degeneration, unspecified cervical region: Secondary | ICD-10-CM | POA: Diagnosis not present

## 2018-12-11 DIAGNOSIS — M9903 Segmental and somatic dysfunction of lumbar region: Secondary | ICD-10-CM | POA: Diagnosis not present

## 2018-12-13 DIAGNOSIS — M9904 Segmental and somatic dysfunction of sacral region: Secondary | ICD-10-CM | POA: Diagnosis not present

## 2018-12-13 DIAGNOSIS — M9901 Segmental and somatic dysfunction of cervical region: Secondary | ICD-10-CM | POA: Diagnosis not present

## 2018-12-13 DIAGNOSIS — M5136 Other intervertebral disc degeneration, lumbar region: Secondary | ICD-10-CM | POA: Diagnosis not present

## 2018-12-13 DIAGNOSIS — M9902 Segmental and somatic dysfunction of thoracic region: Secondary | ICD-10-CM | POA: Diagnosis not present

## 2018-12-13 DIAGNOSIS — M5030 Other cervical disc degeneration, unspecified cervical region: Secondary | ICD-10-CM | POA: Diagnosis not present

## 2018-12-13 DIAGNOSIS — M9903 Segmental and somatic dysfunction of lumbar region: Secondary | ICD-10-CM | POA: Diagnosis not present

## 2018-12-15 DIAGNOSIS — M5136 Other intervertebral disc degeneration, lumbar region: Secondary | ICD-10-CM | POA: Diagnosis not present

## 2018-12-15 DIAGNOSIS — M9901 Segmental and somatic dysfunction of cervical region: Secondary | ICD-10-CM | POA: Diagnosis not present

## 2018-12-15 DIAGNOSIS — M5030 Other cervical disc degeneration, unspecified cervical region: Secondary | ICD-10-CM | POA: Diagnosis not present

## 2018-12-15 DIAGNOSIS — M9902 Segmental and somatic dysfunction of thoracic region: Secondary | ICD-10-CM | POA: Diagnosis not present

## 2018-12-15 DIAGNOSIS — M9904 Segmental and somatic dysfunction of sacral region: Secondary | ICD-10-CM | POA: Diagnosis not present

## 2018-12-15 DIAGNOSIS — M9903 Segmental and somatic dysfunction of lumbar region: Secondary | ICD-10-CM | POA: Diagnosis not present

## 2018-12-18 DIAGNOSIS — M9901 Segmental and somatic dysfunction of cervical region: Secondary | ICD-10-CM | POA: Diagnosis not present

## 2018-12-18 DIAGNOSIS — M9902 Segmental and somatic dysfunction of thoracic region: Secondary | ICD-10-CM | POA: Diagnosis not present

## 2018-12-18 DIAGNOSIS — M5136 Other intervertebral disc degeneration, lumbar region: Secondary | ICD-10-CM | POA: Diagnosis not present

## 2018-12-18 DIAGNOSIS — M9903 Segmental and somatic dysfunction of lumbar region: Secondary | ICD-10-CM | POA: Diagnosis not present

## 2018-12-18 DIAGNOSIS — M5030 Other cervical disc degeneration, unspecified cervical region: Secondary | ICD-10-CM | POA: Diagnosis not present

## 2018-12-18 DIAGNOSIS — M9904 Segmental and somatic dysfunction of sacral region: Secondary | ICD-10-CM | POA: Diagnosis not present

## 2018-12-20 DIAGNOSIS — M9903 Segmental and somatic dysfunction of lumbar region: Secondary | ICD-10-CM | POA: Diagnosis not present

## 2018-12-20 DIAGNOSIS — M5030 Other cervical disc degeneration, unspecified cervical region: Secondary | ICD-10-CM | POA: Diagnosis not present

## 2018-12-20 DIAGNOSIS — M5136 Other intervertebral disc degeneration, lumbar region: Secondary | ICD-10-CM | POA: Diagnosis not present

## 2018-12-20 DIAGNOSIS — M9901 Segmental and somatic dysfunction of cervical region: Secondary | ICD-10-CM | POA: Diagnosis not present

## 2018-12-20 DIAGNOSIS — M9902 Segmental and somatic dysfunction of thoracic region: Secondary | ICD-10-CM | POA: Diagnosis not present

## 2018-12-20 DIAGNOSIS — M9904 Segmental and somatic dysfunction of sacral region: Secondary | ICD-10-CM | POA: Diagnosis not present

## 2018-12-22 DIAGNOSIS — M5030 Other cervical disc degeneration, unspecified cervical region: Secondary | ICD-10-CM | POA: Diagnosis not present

## 2018-12-22 DIAGNOSIS — M9904 Segmental and somatic dysfunction of sacral region: Secondary | ICD-10-CM | POA: Diagnosis not present

## 2018-12-22 DIAGNOSIS — M9901 Segmental and somatic dysfunction of cervical region: Secondary | ICD-10-CM | POA: Diagnosis not present

## 2018-12-22 DIAGNOSIS — M9903 Segmental and somatic dysfunction of lumbar region: Secondary | ICD-10-CM | POA: Diagnosis not present

## 2018-12-22 DIAGNOSIS — M9902 Segmental and somatic dysfunction of thoracic region: Secondary | ICD-10-CM | POA: Diagnosis not present

## 2018-12-22 DIAGNOSIS — M5136 Other intervertebral disc degeneration, lumbar region: Secondary | ICD-10-CM | POA: Diagnosis not present

## 2018-12-25 DIAGNOSIS — M5136 Other intervertebral disc degeneration, lumbar region: Secondary | ICD-10-CM | POA: Diagnosis not present

## 2018-12-25 DIAGNOSIS — M9903 Segmental and somatic dysfunction of lumbar region: Secondary | ICD-10-CM | POA: Diagnosis not present

## 2018-12-25 DIAGNOSIS — M9902 Segmental and somatic dysfunction of thoracic region: Secondary | ICD-10-CM | POA: Diagnosis not present

## 2018-12-25 DIAGNOSIS — M9901 Segmental and somatic dysfunction of cervical region: Secondary | ICD-10-CM | POA: Diagnosis not present

## 2018-12-25 DIAGNOSIS — M9904 Segmental and somatic dysfunction of sacral region: Secondary | ICD-10-CM | POA: Diagnosis not present

## 2018-12-25 DIAGNOSIS — M5030 Other cervical disc degeneration, unspecified cervical region: Secondary | ICD-10-CM | POA: Diagnosis not present

## 2018-12-27 DIAGNOSIS — M9903 Segmental and somatic dysfunction of lumbar region: Secondary | ICD-10-CM | POA: Diagnosis not present

## 2018-12-27 DIAGNOSIS — M9902 Segmental and somatic dysfunction of thoracic region: Secondary | ICD-10-CM | POA: Diagnosis not present

## 2018-12-27 DIAGNOSIS — M9901 Segmental and somatic dysfunction of cervical region: Secondary | ICD-10-CM | POA: Diagnosis not present

## 2018-12-27 DIAGNOSIS — M9904 Segmental and somatic dysfunction of sacral region: Secondary | ICD-10-CM | POA: Diagnosis not present

## 2018-12-27 DIAGNOSIS — M5030 Other cervical disc degeneration, unspecified cervical region: Secondary | ICD-10-CM | POA: Diagnosis not present

## 2018-12-27 DIAGNOSIS — M5136 Other intervertebral disc degeneration, lumbar region: Secondary | ICD-10-CM | POA: Diagnosis not present

## 2019-01-03 DIAGNOSIS — M5136 Other intervertebral disc degeneration, lumbar region: Secondary | ICD-10-CM | POA: Diagnosis not present

## 2019-01-03 DIAGNOSIS — M5030 Other cervical disc degeneration, unspecified cervical region: Secondary | ICD-10-CM | POA: Diagnosis not present

## 2019-01-03 DIAGNOSIS — M9902 Segmental and somatic dysfunction of thoracic region: Secondary | ICD-10-CM | POA: Diagnosis not present

## 2019-01-03 DIAGNOSIS — M9903 Segmental and somatic dysfunction of lumbar region: Secondary | ICD-10-CM | POA: Diagnosis not present

## 2019-01-03 DIAGNOSIS — M9904 Segmental and somatic dysfunction of sacral region: Secondary | ICD-10-CM | POA: Diagnosis not present

## 2019-01-03 DIAGNOSIS — M9901 Segmental and somatic dysfunction of cervical region: Secondary | ICD-10-CM | POA: Diagnosis not present

## 2019-01-24 DIAGNOSIS — M5030 Other cervical disc degeneration, unspecified cervical region: Secondary | ICD-10-CM | POA: Diagnosis not present

## 2019-01-24 DIAGNOSIS — M5136 Other intervertebral disc degeneration, lumbar region: Secondary | ICD-10-CM | POA: Diagnosis not present

## 2019-01-24 DIAGNOSIS — M9901 Segmental and somatic dysfunction of cervical region: Secondary | ICD-10-CM | POA: Diagnosis not present

## 2019-01-24 DIAGNOSIS — M9902 Segmental and somatic dysfunction of thoracic region: Secondary | ICD-10-CM | POA: Diagnosis not present

## 2019-01-24 DIAGNOSIS — M9903 Segmental and somatic dysfunction of lumbar region: Secondary | ICD-10-CM | POA: Diagnosis not present

## 2019-01-24 DIAGNOSIS — M9904 Segmental and somatic dysfunction of sacral region: Secondary | ICD-10-CM | POA: Diagnosis not present

## 2019-02-02 ENCOUNTER — Ambulatory Visit: Payer: PPO | Admitting: Psychiatry

## 2019-02-02 DIAGNOSIS — M9901 Segmental and somatic dysfunction of cervical region: Secondary | ICD-10-CM | POA: Diagnosis not present

## 2019-02-02 DIAGNOSIS — M9902 Segmental and somatic dysfunction of thoracic region: Secondary | ICD-10-CM | POA: Diagnosis not present

## 2019-02-02 DIAGNOSIS — M5136 Other intervertebral disc degeneration, lumbar region: Secondary | ICD-10-CM | POA: Diagnosis not present

## 2019-02-02 DIAGNOSIS — M9904 Segmental and somatic dysfunction of sacral region: Secondary | ICD-10-CM | POA: Diagnosis not present

## 2019-02-02 DIAGNOSIS — M5030 Other cervical disc degeneration, unspecified cervical region: Secondary | ICD-10-CM | POA: Diagnosis not present

## 2019-02-02 DIAGNOSIS — M9903 Segmental and somatic dysfunction of lumbar region: Secondary | ICD-10-CM | POA: Diagnosis not present

## 2019-02-09 DIAGNOSIS — M9904 Segmental and somatic dysfunction of sacral region: Secondary | ICD-10-CM | POA: Diagnosis not present

## 2019-02-09 DIAGNOSIS — M5136 Other intervertebral disc degeneration, lumbar region: Secondary | ICD-10-CM | POA: Diagnosis not present

## 2019-02-09 DIAGNOSIS — M9901 Segmental and somatic dysfunction of cervical region: Secondary | ICD-10-CM | POA: Diagnosis not present

## 2019-02-09 DIAGNOSIS — M9902 Segmental and somatic dysfunction of thoracic region: Secondary | ICD-10-CM | POA: Diagnosis not present

## 2019-02-09 DIAGNOSIS — M9903 Segmental and somatic dysfunction of lumbar region: Secondary | ICD-10-CM | POA: Diagnosis not present

## 2019-02-09 DIAGNOSIS — M5030 Other cervical disc degeneration, unspecified cervical region: Secondary | ICD-10-CM | POA: Diagnosis not present

## 2019-02-16 DIAGNOSIS — M5030 Other cervical disc degeneration, unspecified cervical region: Secondary | ICD-10-CM | POA: Diagnosis not present

## 2019-02-16 DIAGNOSIS — M9903 Segmental and somatic dysfunction of lumbar region: Secondary | ICD-10-CM | POA: Diagnosis not present

## 2019-02-16 DIAGNOSIS — M9904 Segmental and somatic dysfunction of sacral region: Secondary | ICD-10-CM | POA: Diagnosis not present

## 2019-02-16 DIAGNOSIS — M9901 Segmental and somatic dysfunction of cervical region: Secondary | ICD-10-CM | POA: Diagnosis not present

## 2019-02-16 DIAGNOSIS — M9902 Segmental and somatic dysfunction of thoracic region: Secondary | ICD-10-CM | POA: Diagnosis not present

## 2019-02-16 DIAGNOSIS — M5136 Other intervertebral disc degeneration, lumbar region: Secondary | ICD-10-CM | POA: Diagnosis not present

## 2019-02-19 ENCOUNTER — Other Ambulatory Visit: Payer: Self-pay

## 2019-02-19 ENCOUNTER — Ambulatory Visit: Payer: PPO | Admitting: Psychiatry

## 2019-02-19 ENCOUNTER — Encounter: Payer: Self-pay | Admitting: Psychiatry

## 2019-02-19 DIAGNOSIS — N951 Menopausal and female climacteric states: Secondary | ICD-10-CM | POA: Diagnosis not present

## 2019-02-19 DIAGNOSIS — F411 Generalized anxiety disorder: Secondary | ICD-10-CM | POA: Diagnosis not present

## 2019-02-19 DIAGNOSIS — F3342 Major depressive disorder, recurrent, in full remission: Secondary | ICD-10-CM | POA: Diagnosis not present

## 2019-02-19 DIAGNOSIS — F5101 Primary insomnia: Secondary | ICD-10-CM

## 2019-02-19 MED ORDER — ALPRAZOLAM 0.5 MG PO TABS: ORAL_TABLET | ORAL | 1 refills | Status: DC

## 2019-02-19 MED ORDER — ZOLPIDEM TARTRATE ER 12.5 MG PO TBCR: 12.5000 mg | EXTENDED_RELEASE_TABLET | Freq: Every day | ORAL | 1 refills | Status: DC

## 2019-02-19 MED ORDER — DOXEPIN HCL 100 MG PO CAPS: 100.0000 mg | ORAL_CAPSULE | Freq: Every day | ORAL | 1 refills | Status: DC

## 2019-02-19 NOTE — Progress Notes (Addendum)
Angie Liu [id number] 1945/11/11 73 y.o.  Virtual Visit via Telephone Note  I connected with Angie Liu on 02/19/19 at  2:15 PM EDT by telephone and verified that I am speaking with the correct person using two identifiers.   I discussed the limitations, risks, security and privacy concerns of performing an evaluation and management service by telephone and the availability of in person appointments. I also discussed with the patient that there may be a patient responsible charge related to this service. The patient expressed understanding and agreed to proceed.   I discussed the assessment and treatment plan with the patient. The patient was provided an opportunity to ask questions and all were answered. The patient agreed with the plan and demonstrated an understanding of the instructions.   The patient was advised to call back or seek an in-person evaluation if the symptoms worsen or if the condition fails to improve as anticipated.  I provided 30 minutes of non-face-to-face time during this encounter.  The provider was located at home.  The patient was located at home.   Angie Liu, PMHNP   Subjective:   Patient ID:  Angie Liu is a 73 y.o. (DOB 1946-04-30) female.  Chief Complaint:  Chief Complaint  Patient presents with  . Follow-up    h/o insomnia, anxiety, and depression    HPI Angie Liu presents to the office today for follow-up of insomnia and h/o depression. She reports that she has been sleeping through the night for the last month and has not awakened and needed another dose of alprazolam during the night. She reports that she has enjoyed having her husband home more since he has not been going to bridge games and other activities due to [place] 19. She reports that she has been missing going to the gym since gyms have been closed due to [place] 19. She reports that she has been walking outside some and she and her husband have been doing some yardwork. She  reports that her energy and motivation have been good. "The medications really help me to sleep." She reports her mood "most of the time, it's been fine." She reports that she and her husband have noticed she has laughed more in recent weeks. She reports that she had some situational anxiety when she learned more about the early stages of the pandemic and need to get her medications and remain at home. She reports that "the anxiety comes and goes." She reports that her appetite has been "too good" and has recently resolved not to snack between meals. She reports that she typically does not have difficulty with concentration unless she is overly tired, such as after physical exertion. Denies SI.   She reports that she learned that her PCP will be moving in May and will now be seeing her remotely. Also learned that someone that she knows has been laid off. Reports that she has had regular weekly telephone conversations with her daughter. She reports that she has been playing the piano more and enjoying it more.   Review of Systems:  Review of Systems  Gastrointestinal: Negative.   Musculoskeletal: Negative for gait problem.       Foot pain after prolonged standing  Allergic/Immunologic: Positive for environmental allergies.  Neurological: Negative for tremors.  Psychiatric/Behavioral:       Please refer to HPI    Medications: I have reviewed the patient's current medications.  Current Outpatient Medications  Medication Sig Dispense Refill  . [START ON  04/05/2019] ALPRAZolam (XANAX) 0.5 MG tablet Take 1 tab po QHS and 1 tab po qd prn anxiety 180 tablet 1  . chlorpheniramine (CHLOR-TRIMETON) 4 MG tablet Take 4 mg by mouth 2 (two) times daily as needed for allergies.     . Cholecalciferol (VITAMIN D3) 1000 units CAPS Take by mouth.    . Multiple Vitamins-Minerals (PRESERVISION AREDS PO) Take 1 tablet by mouth 2 (two) times daily.    Marland Kitchen OVER THE COUNTER MEDICATION 4 capsules daily. Calm CP, takes 1  breakfast, 1 at dinner and 2 at bedtime    . OVER THE COUNTER MEDICATION Place 1 tablet under the tongue every morning. Seneca    . OVER THE COUNTER MEDICATION Place 1 tablet under the tongue. Butyra    . OVER THE COUNTER MEDICATION Alpha gabba    . Progesterone Micronized (PROGESTERONE PO) Take 2 capsules by mouth at bedtime. 1:1 SR compound medication at [facility].    Marland Kitchen doxepin (SINEQUAN) 100 MG capsule Take 1 capsule (100 mg total) by mouth at bedtime. 90 capsule 1  . OVER THE COUNTER MEDICATION 1 capsule at bedtime. Kavinace capusle    . [START ON 03/15/2019] zolpidem (AMBIEN CR) 12.5 MG CR tablet Take 1 tablet (12.5 mg total) by mouth at bedtime. 90 tablet 1   No current facility-administered medications for this visit.     Medication Side Effects: None  Allergies:  Allergies  Allergen Reactions  . Latex Rash    Past Medical History:  Diagnosis Date  . Anxiety   . Depression   . Hayfever     Family History  Problem Relation Age of Onset  . Cancer Mother        colon  . Depression Mother   . Diabetes Father   . Depression Father   . Depression Son   . Depression Paternal Uncle     Social History   Socioeconomic History  . Marital status: Married    Spouse name: Not on file  . Number of children: Not on file  . Years of education: Not on file  . Highest education level: Not on file  Occupational History  . Not on file  Social Needs  . Financial resource strain: Not on file  . Food insecurity:    Worry: Not on file    Inability: Not on file  . Transportation needs:    Medical: Not on file    Non-medical: Not on file  Tobacco Use  . Smoking status: Never Smoker  . Smokeless tobacco: Never Used  Substance and Sexual Activity  . Alcohol use: Yes  . Drug use: No  . Sexual activity: Not on file  Lifestyle  . Physical activity:    Days per week: Not on file    Minutes per session: Not on file  . Stress: Not on file  Relationships  . Social connections:     Talks on phone: Not on file    Gets together: Not on file    Attends religious service: Not on file    Active member of club or organization: Not on file    Attends meetings of clubs or organizations: Not on file    Relationship status: Not on file  . Intimate partner violence:    Fear of current or ex partner: Not on file    Emotionally abused: Not on file    Physically abused: Not on file    Forced sexual activity: Not on file  Other Topics Concern  .  Not on file  Social History Narrative  . Not on file    Past Medical History, Surgical history, Social history, and Family history were reviewed and updated as appropriate.   Please see review of systems for further details on the patient's review from today.   Objective:   Physical Exam:  There were no vitals taken for this visit.  Physical Exam Neurological:     Mental Status: She is alert and oriented to person, place, and time.     Cranial Nerves: No dysarthria.  Psychiatric:        Attention and Perception: Attention normal.        Mood and Affect: Mood normal.        Speech: Speech normal.        Behavior: Behavior is cooperative.        Thought Content: Thought content normal. Thought content is not paranoid or delusional. Thought content does not include homicidal or suicidal ideation. Thought content does not include homicidal or suicidal plan.        Cognition and Memory: Cognition and memory normal.        Judgment: Judgment normal.     Lab Review:  No results found for: NA, K, CL, CO2, GLUCOSE, BUN, CREATININE, CALCIUM, PROT, ALBUMIN, AST, ALT, ALKPHOS, BILITOT, GFRNONAA, GFRAA  No results found for: WBC, RBC, HGB, HCT, PLT, MCV, MCH, MCHC, RDW, LYMPHSABS, MONOABS, EOSABS, BASOSABS  No results found for: POCLITH, LITHIUM   No results found for: PHENYTOIN, PHENOBARB, VALPROATE, CBMZ   .res Assessment: Plan:   Continue Doxepin 100 mg po QHS for depression and insomnia. Continue Ambien CR 12.5 mg po  QHS prn insomnia. Continue Xanax 0.5 mg po QHS insomnia and 1 tab po qd prn anxiety.   Patient advised to contact office with any questions, adverse effects, or acute worsening in signs and symptoms.  Primary insomnia - Plan: doxepin (SINEQUAN) 100 MG capsule, zolpidem (AMBIEN CR) 12.5 MG CR tablet, ALPRAZolam (XANAX) 0.5 MG tablet  Major depressive disorder, recurrent episode, in full remission ([place]) - Plan: doxepin (SINEQUAN) 100 MG capsule  Generalized anxiety disorder - Plan: ALPRAZolam (XANAX) 0.5 MG tablet  Please see After Visit Summary for patient specific instructions.  Future Appointments  Date Time Provider [place]  08/21/2019  1:00 PM Angie Liu, PMHNP [facility] None    No orders of the defined types were placed in this encounter.     -------------------------------

## 2019-02-23 DIAGNOSIS — H353132 Nonexudative age-related macular degeneration, bilateral, intermediate dry stage: Secondary | ICD-10-CM | POA: Diagnosis not present

## 2019-02-28 DIAGNOSIS — N951 Menopausal and female climacteric states: Secondary | ICD-10-CM | POA: Diagnosis not present

## 2019-03-20 DIAGNOSIS — Z974 Presence of external hearing-aid: Secondary | ICD-10-CM | POA: Diagnosis not present

## 2019-03-20 DIAGNOSIS — Z7289 Other problems related to lifestyle: Secondary | ICD-10-CM | POA: Diagnosis not present

## 2019-03-20 DIAGNOSIS — H9113 Presbycusis, bilateral: Secondary | ICD-10-CM | POA: Diagnosis not present

## 2019-03-20 DIAGNOSIS — L299 Pruritus, unspecified: Secondary | ICD-10-CM | POA: Diagnosis not present

## 2019-03-20 DIAGNOSIS — H938X3 Other specified disorders of ear, bilateral: Secondary | ICD-10-CM | POA: Diagnosis not present

## 2019-03-30 DIAGNOSIS — M9901 Segmental and somatic dysfunction of cervical region: Secondary | ICD-10-CM | POA: Diagnosis not present

## 2019-03-30 DIAGNOSIS — M9904 Segmental and somatic dysfunction of sacral region: Secondary | ICD-10-CM | POA: Diagnosis not present

## 2019-03-30 DIAGNOSIS — M9902 Segmental and somatic dysfunction of thoracic region: Secondary | ICD-10-CM | POA: Diagnosis not present

## 2019-03-30 DIAGNOSIS — M5136 Other intervertebral disc degeneration, lumbar region: Secondary | ICD-10-CM | POA: Diagnosis not present

## 2019-03-30 DIAGNOSIS — M5030 Other cervical disc degeneration, unspecified cervical region: Secondary | ICD-10-CM | POA: Diagnosis not present

## 2019-03-30 DIAGNOSIS — M9903 Segmental and somatic dysfunction of lumbar region: Secondary | ICD-10-CM | POA: Diagnosis not present

## 2019-04-02 DIAGNOSIS — M9904 Segmental and somatic dysfunction of sacral region: Secondary | ICD-10-CM | POA: Diagnosis not present

## 2019-04-02 DIAGNOSIS — M5136 Other intervertebral disc degeneration, lumbar region: Secondary | ICD-10-CM | POA: Diagnosis not present

## 2019-04-02 DIAGNOSIS — M9903 Segmental and somatic dysfunction of lumbar region: Secondary | ICD-10-CM | POA: Diagnosis not present

## 2019-04-02 DIAGNOSIS — M9902 Segmental and somatic dysfunction of thoracic region: Secondary | ICD-10-CM | POA: Diagnosis not present

## 2019-04-02 DIAGNOSIS — M9901 Segmental and somatic dysfunction of cervical region: Secondary | ICD-10-CM | POA: Diagnosis not present

## 2019-04-04 DIAGNOSIS — M9904 Segmental and somatic dysfunction of sacral region: Secondary | ICD-10-CM | POA: Diagnosis not present

## 2019-04-04 DIAGNOSIS — M9902 Segmental and somatic dysfunction of thoracic region: Secondary | ICD-10-CM | POA: Diagnosis not present

## 2019-04-04 DIAGNOSIS — M9901 Segmental and somatic dysfunction of cervical region: Secondary | ICD-10-CM | POA: Diagnosis not present

## 2019-04-04 DIAGNOSIS — M5136 Other intervertebral disc degeneration, lumbar region: Secondary | ICD-10-CM | POA: Diagnosis not present

## 2019-04-04 DIAGNOSIS — M9903 Segmental and somatic dysfunction of lumbar region: Secondary | ICD-10-CM | POA: Diagnosis not present

## 2019-04-06 DIAGNOSIS — M5136 Other intervertebral disc degeneration, lumbar region: Secondary | ICD-10-CM | POA: Diagnosis not present

## 2019-04-06 DIAGNOSIS — M5030 Other cervical disc degeneration, unspecified cervical region: Secondary | ICD-10-CM | POA: Diagnosis not present

## 2019-04-06 DIAGNOSIS — M9902 Segmental and somatic dysfunction of thoracic region: Secondary | ICD-10-CM | POA: Diagnosis not present

## 2019-04-06 DIAGNOSIS — M9901 Segmental and somatic dysfunction of cervical region: Secondary | ICD-10-CM | POA: Diagnosis not present

## 2019-04-06 DIAGNOSIS — M9904 Segmental and somatic dysfunction of sacral region: Secondary | ICD-10-CM | POA: Diagnosis not present

## 2019-04-06 DIAGNOSIS — M9903 Segmental and somatic dysfunction of lumbar region: Secondary | ICD-10-CM | POA: Diagnosis not present

## 2019-04-09 DIAGNOSIS — M9901 Segmental and somatic dysfunction of cervical region: Secondary | ICD-10-CM | POA: Diagnosis not present

## 2019-04-09 DIAGNOSIS — M5030 Other cervical disc degeneration, unspecified cervical region: Secondary | ICD-10-CM | POA: Diagnosis not present

## 2019-04-09 DIAGNOSIS — M5136 Other intervertebral disc degeneration, lumbar region: Secondary | ICD-10-CM | POA: Diagnosis not present

## 2019-04-09 DIAGNOSIS — M9902 Segmental and somatic dysfunction of thoracic region: Secondary | ICD-10-CM | POA: Diagnosis not present

## 2019-04-09 DIAGNOSIS — M9903 Segmental and somatic dysfunction of lumbar region: Secondary | ICD-10-CM | POA: Diagnosis not present

## 2019-04-09 DIAGNOSIS — M9904 Segmental and somatic dysfunction of sacral region: Secondary | ICD-10-CM | POA: Diagnosis not present

## 2019-04-13 DIAGNOSIS — M9901 Segmental and somatic dysfunction of cervical region: Secondary | ICD-10-CM | POA: Diagnosis not present

## 2019-04-13 DIAGNOSIS — W19XXXA Unspecified fall, initial encounter: Secondary | ICD-10-CM | POA: Diagnosis not present

## 2019-04-13 DIAGNOSIS — M5136 Other intervertebral disc degeneration, lumbar region: Secondary | ICD-10-CM | POA: Diagnosis not present

## 2019-04-13 DIAGNOSIS — R69 Illness, unspecified: Secondary | ICD-10-CM | POA: Diagnosis not present

## 2019-04-13 DIAGNOSIS — M9902 Segmental and somatic dysfunction of thoracic region: Secondary | ICD-10-CM | POA: Diagnosis not present

## 2019-04-13 DIAGNOSIS — M9903 Segmental and somatic dysfunction of lumbar region: Secondary | ICD-10-CM | POA: Diagnosis not present

## 2019-04-13 DIAGNOSIS — M9904 Segmental and somatic dysfunction of sacral region: Secondary | ICD-10-CM | POA: Diagnosis not present

## 2019-04-16 DIAGNOSIS — M9903 Segmental and somatic dysfunction of lumbar region: Secondary | ICD-10-CM | POA: Diagnosis not present

## 2019-04-16 DIAGNOSIS — M9902 Segmental and somatic dysfunction of thoracic region: Secondary | ICD-10-CM | POA: Diagnosis not present

## 2019-04-16 DIAGNOSIS — M9901 Segmental and somatic dysfunction of cervical region: Secondary | ICD-10-CM | POA: Diagnosis not present

## 2019-04-16 DIAGNOSIS — M5136 Other intervertebral disc degeneration, lumbar region: Secondary | ICD-10-CM | POA: Diagnosis not present

## 2019-04-16 DIAGNOSIS — M9904 Segmental and somatic dysfunction of sacral region: Secondary | ICD-10-CM | POA: Diagnosis not present

## 2019-04-18 DIAGNOSIS — N941 Unspecified dyspareunia: Secondary | ICD-10-CM | POA: Diagnosis not present

## 2019-04-18 DIAGNOSIS — N9089 Other specified noninflammatory disorders of vulva and perineum: Secondary | ICD-10-CM | POA: Diagnosis not present

## 2019-04-18 DIAGNOSIS — Z6825 Body mass index (BMI) 25.0-25.9, adult: Secondary | ICD-10-CM | POA: Diagnosis not present

## 2019-04-18 DIAGNOSIS — Z124 Encounter for screening for malignant neoplasm of cervix: Secondary | ICD-10-CM | POA: Diagnosis not present

## 2019-04-20 DIAGNOSIS — M9904 Segmental and somatic dysfunction of sacral region: Secondary | ICD-10-CM | POA: Diagnosis not present

## 2019-04-20 DIAGNOSIS — M5136 Other intervertebral disc degeneration, lumbar region: Secondary | ICD-10-CM | POA: Diagnosis not present

## 2019-04-20 DIAGNOSIS — M9902 Segmental and somatic dysfunction of thoracic region: Secondary | ICD-10-CM | POA: Diagnosis not present

## 2019-04-20 DIAGNOSIS — M9901 Segmental and somatic dysfunction of cervical region: Secondary | ICD-10-CM | POA: Diagnosis not present

## 2019-04-20 DIAGNOSIS — M9903 Segmental and somatic dysfunction of lumbar region: Secondary | ICD-10-CM | POA: Diagnosis not present

## 2019-04-27 DIAGNOSIS — M5136 Other intervertebral disc degeneration, lumbar region: Secondary | ICD-10-CM | POA: Diagnosis not present

## 2019-04-27 DIAGNOSIS — M9903 Segmental and somatic dysfunction of lumbar region: Secondary | ICD-10-CM | POA: Diagnosis not present

## 2019-04-27 DIAGNOSIS — M9904 Segmental and somatic dysfunction of sacral region: Secondary | ICD-10-CM | POA: Diagnosis not present

## 2019-04-27 DIAGNOSIS — M9902 Segmental and somatic dysfunction of thoracic region: Secondary | ICD-10-CM | POA: Diagnosis not present

## 2019-04-27 DIAGNOSIS — M9901 Segmental and somatic dysfunction of cervical region: Secondary | ICD-10-CM | POA: Diagnosis not present

## 2019-04-30 DIAGNOSIS — Z1231 Encounter for screening mammogram for malignant neoplasm of breast: Secondary | ICD-10-CM | POA: Diagnosis not present

## 2019-05-02 DIAGNOSIS — M9902 Segmental and somatic dysfunction of thoracic region: Secondary | ICD-10-CM | POA: Diagnosis not present

## 2019-05-02 DIAGNOSIS — M9903 Segmental and somatic dysfunction of lumbar region: Secondary | ICD-10-CM | POA: Diagnosis not present

## 2019-05-02 DIAGNOSIS — M5030 Other cervical disc degeneration, unspecified cervical region: Secondary | ICD-10-CM | POA: Diagnosis not present

## 2019-05-02 DIAGNOSIS — M9901 Segmental and somatic dysfunction of cervical region: Secondary | ICD-10-CM | POA: Diagnosis not present

## 2019-05-02 DIAGNOSIS — M9904 Segmental and somatic dysfunction of sacral region: Secondary | ICD-10-CM | POA: Diagnosis not present

## 2019-05-02 DIAGNOSIS — M5136 Other intervertebral disc degeneration, lumbar region: Secondary | ICD-10-CM | POA: Diagnosis not present

## 2019-05-11 DIAGNOSIS — M5030 Other cervical disc degeneration, unspecified cervical region: Secondary | ICD-10-CM | POA: Diagnosis not present

## 2019-05-11 DIAGNOSIS — M5136 Other intervertebral disc degeneration, lumbar region: Secondary | ICD-10-CM | POA: Diagnosis not present

## 2019-05-11 DIAGNOSIS — M9904 Segmental and somatic dysfunction of sacral region: Secondary | ICD-10-CM | POA: Diagnosis not present

## 2019-05-11 DIAGNOSIS — M9903 Segmental and somatic dysfunction of lumbar region: Secondary | ICD-10-CM | POA: Diagnosis not present

## 2019-05-11 DIAGNOSIS — M9901 Segmental and somatic dysfunction of cervical region: Secondary | ICD-10-CM | POA: Diagnosis not present

## 2019-05-11 DIAGNOSIS — M9902 Segmental and somatic dysfunction of thoracic region: Secondary | ICD-10-CM | POA: Diagnosis not present

## 2019-07-17 DIAGNOSIS — Z23 Encounter for immunization: Secondary | ICD-10-CM | POA: Diagnosis not present

## 2019-07-17 DIAGNOSIS — I10 Essential (primary) hypertension: Secondary | ICD-10-CM | POA: Diagnosis not present

## 2019-07-17 DIAGNOSIS — E559 Vitamin D deficiency, unspecified: Secondary | ICD-10-CM | POA: Diagnosis not present

## 2019-07-17 DIAGNOSIS — E782 Mixed hyperlipidemia: Secondary | ICD-10-CM | POA: Diagnosis not present

## 2019-07-17 DIAGNOSIS — R21 Rash and other nonspecific skin eruption: Secondary | ICD-10-CM | POA: Diagnosis not present

## 2019-07-17 DIAGNOSIS — Z0000 Encounter for general adult medical examination without abnormal findings: Secondary | ICD-10-CM | POA: Diagnosis not present

## 2019-07-17 DIAGNOSIS — R7301 Impaired fasting glucose: Secondary | ICD-10-CM | POA: Diagnosis not present

## 2019-07-17 DIAGNOSIS — R600 Localized edema: Secondary | ICD-10-CM | POA: Diagnosis not present

## 2019-07-17 DIAGNOSIS — F39 Unspecified mood [affective] disorder: Secondary | ICD-10-CM | POA: Diagnosis not present

## 2019-07-17 DIAGNOSIS — E039 Hypothyroidism, unspecified: Secondary | ICD-10-CM | POA: Diagnosis not present

## 2019-08-03 DIAGNOSIS — R600 Localized edema: Secondary | ICD-10-CM | POA: Diagnosis not present

## 2019-08-03 DIAGNOSIS — F39 Unspecified mood [affective] disorder: Secondary | ICD-10-CM | POA: Diagnosis not present

## 2019-08-03 DIAGNOSIS — Z23 Encounter for immunization: Secondary | ICD-10-CM | POA: Diagnosis not present

## 2019-08-03 DIAGNOSIS — I10 Essential (primary) hypertension: Secondary | ICD-10-CM | POA: Diagnosis not present

## 2019-08-10 DIAGNOSIS — Z85828 Personal history of other malignant neoplasm of skin: Secondary | ICD-10-CM | POA: Diagnosis not present

## 2019-08-10 DIAGNOSIS — L814 Other melanin hyperpigmentation: Secondary | ICD-10-CM | POA: Diagnosis not present

## 2019-08-10 DIAGNOSIS — D2261 Melanocytic nevi of right upper limb, including shoulder: Secondary | ICD-10-CM | POA: Diagnosis not present

## 2019-08-10 DIAGNOSIS — L570 Actinic keratosis: Secondary | ICD-10-CM | POA: Diagnosis not present

## 2019-08-10 DIAGNOSIS — D225 Melanocytic nevi of trunk: Secondary | ICD-10-CM | POA: Diagnosis not present

## 2019-08-10 DIAGNOSIS — L821 Other seborrheic keratosis: Secondary | ICD-10-CM | POA: Diagnosis not present

## 2019-08-10 DIAGNOSIS — D1801 Hemangioma of skin and subcutaneous tissue: Secondary | ICD-10-CM | POA: Diagnosis not present

## 2019-08-21 ENCOUNTER — Encounter: Payer: Self-pay | Admitting: Psychiatry

## 2019-08-21 ENCOUNTER — Other Ambulatory Visit: Payer: Self-pay

## 2019-08-21 ENCOUNTER — Ambulatory Visit (INDEPENDENT_AMBULATORY_CARE_PROVIDER_SITE_OTHER): Payer: PPO | Admitting: Psychiatry

## 2019-08-21 DIAGNOSIS — F3342 Major depressive disorder, recurrent, in full remission: Secondary | ICD-10-CM

## 2019-08-21 DIAGNOSIS — F411 Generalized anxiety disorder: Secondary | ICD-10-CM

## 2019-08-21 DIAGNOSIS — F5101 Primary insomnia: Secondary | ICD-10-CM | POA: Diagnosis not present

## 2019-08-21 MED ORDER — DOXEPIN HCL 100 MG PO CAPS: 100.0000 mg | ORAL_CAPSULE | Freq: Every day | ORAL | 1 refills | Status: DC

## 2019-08-21 MED ORDER — ALPRAZOLAM 0.5 MG PO TABS: ORAL_TABLET | ORAL | 1 refills | Status: DC

## 2019-08-21 MED ORDER — ZOLPIDEM TARTRATE ER 12.5 MG PO TBCR: 12.5000 mg | EXTENDED_RELEASE_TABLET | Freq: Every day | ORAL | 1 refills | Status: DC

## 2019-08-21 NOTE — Progress Notes (Signed)
Angie Liu [id number] 1946/05/06 73 y.o.  Subjective:   Patient ID:  Angie Liu is a 73 y.o. (DOB 12-Feb-1946) female.  Chief Complaint:  Chief Complaint  Patient presents with  . Follow-up    Insomnia, Depression, and Anxiety.     HPI Angie Liu presents to the office today for follow-up of insomnia, depression, and anxiety. She reports that she has been sleeping very well and very rarely experiences middle of the night awakenings and therefore typically only takes 1 alprazolam 0.5 mg tablet in a 24-hour span. She reports that she is periodically napping, about 3 days a week. She reports that at times she may be napping due to boredom. She reports that her mood "has generally been good." Reports that she initially had some adjustment difficulties at the start of the pandemic. She reports that she had anxiety when they drove on the highway to go to their daughter's for the first time since February. She reports that she plans to ask husband to allow more following distance on future trips. Denies anhedonia and reports that she greatly enjoyed seeing family. Has been playing the piano again and is enjoying this. She reports that early in the summer "I was happier than usual." She reports that she has enjoyed having her husband home more during the pandemic. She reports that her concentration "depends." She reports that she is able to focus on a book that is interesting. Has difficulty focusing on some newspaper articles. Denies SI.   She reports that she has been trying to lose weight. She reports that her appetite has been stable. She reports that she eats 3 meals daily and typically eats a snack.   Celebrated her 73th birthday this month with family after not being able to see them since the start of the pandemic. Reports that they will periodically gather with neighbors outside while socially distancing.   Review of Systems:  Review of Systems  HENT: Positive for hearing loss,  sneezing and tinnitus.   Musculoskeletal: Negative for gait problem.  Allergic/Immunologic: Positive for environmental allergies.  Neurological: Negative for tremors.  Psychiatric/Behavioral:       Please refer to HPI    Horald Pollen, MD is now her PCP after her previous PCP relocated.   Medications: I have reviewed the patient's current medications.  Current Outpatient Medications  Medication Sig Dispense Refill  . ALPRAZolam (XANAX) 0.5 MG tablet Take 1 tab po QHS and 1 tab po qd prn anxiety 180 tablet 1  . chlorpheniramine (CHLOR-TRIMETON) 4 MG tablet Take 4 mg by mouth 2 (two) times daily as needed for allergies.     . Cholecalciferol (VITAMIN D3) 1000 units CAPS Take by mouth.    . doxepin (SINEQUAN) 100 MG capsule Take 1 capsule (100 mg total) by mouth at bedtime. 90 capsule 1  . Multiple Vitamins-Minerals (PRESERVISION AREDS PO) Take 1 tablet by mouth 2 (two) times daily.    Marland Kitchen OVER THE COUNTER MEDICATION 4 capsules daily. Calm CP, takes 1 breakfast, 1 at dinner and 2 at bedtime    . OVER THE COUNTER MEDICATION 1 capsule at bedtime. Kavinace capusle    . OVER THE COUNTER MEDICATION Place 1 tablet under the tongue every morning. Seneca    . OVER THE COUNTER MEDICATION Place 1 tablet under the tongue. Butyra    . OVER THE COUNTER MEDICATION Alpha gabba    . Progesterone Micronized (PROGESTERONE PO) Take 2 capsules by mouth at bedtime. 1:1 SR compound medication  at [facility].    Derrill Memo ON 09/13/2019] zolpidem (AMBIEN CR) 12.5 MG CR tablet Take 1 tablet (12.5 mg total) by mouth at bedtime. 90 tablet 1   No current facility-administered medications for this visit.     Medication Side Effects: None  Allergies:  Allergies  Allergen Reactions  . Latex Rash    Past Medical History:  Diagnosis Date  . Anxiety   . Depression   . Hayfever     Family History  Problem Relation Age of Onset  . Cancer Mother        colon  . Depression Mother   . Diabetes Father   .  Depression Father   . Depression Son   . Depression Paternal Uncle     Social History   Socioeconomic History  . Marital status: Married    Spouse name: Not on file  . Number of children: Not on file  . Years of education: Not on file  . Highest education level: Not on file  Occupational History  . Not on file  Social Needs  . Financial resource strain: Not on file  . Food insecurity    Worry: Not on file    Inability: Not on file  . Transportation needs    Medical: Not on file    Non-medical: Not on file  Tobacco Use  . Smoking status: Never Smoker  . Smokeless tobacco: Never Used  Substance and Sexual Activity  . Alcohol use: Yes  . Drug use: No  . Sexual activity: Not on file  Lifestyle  . Physical activity    Days per week: Not on file    Minutes per session: Not on file  . Stress: Not on file  Relationships  . Social Herbalist on phone: Not on file    Gets together: Not on file    Attends religious service: Not on file    Active member of club or organization: Not on file    Attends meetings of clubs or organizations: Not on file    Relationship status: Not on file  . Intimate partner violence    Fear of current or ex partner: Not on file    Emotionally abused: Not on file    Physically abused: Not on file    Forced sexual activity: Not on file  Other Topics Concern  . Not on file  Social History Narrative  . Not on file    Past Medical History, Surgical history, Social history, and Family history were reviewed and updated as appropriate.   Please see review of systems for further details on the patient's review from today.   Objective:   Physical Exam:  BP 106/76   Pulse 82   Physical Exam Constitutional:      General: She is not in acute distress.    Appearance: She is well-developed.  Musculoskeletal:        General: No deformity.  Neurological:     Mental Status: She is alert and oriented to person, place, and time.      Coordination: Coordination normal.  Psychiatric:        Attention and Perception: Attention and perception normal. She does not perceive auditory or visual hallucinations.        Mood and Affect: Mood normal. Mood is not anxious or depressed. Affect is not labile, blunt, angry or inappropriate.        Speech: Speech normal.        Behavior:  Behavior normal.        Thought Content: Thought content normal. Thought content does not include homicidal or suicidal ideation. Thought content does not include homicidal or suicidal plan.        Cognition and Memory: Cognition and memory normal.        Judgment: Judgment normal.     Comments: Insight intact. No delusions.      Lab Review:  No results found for: NA, K, CL, CO2, GLUCOSE, BUN, CREATININE, CALCIUM, PROT, ALBUMIN, AST, ALT, ALKPHOS, BILITOT, GFRNONAA, GFRAA  No results found for: WBC, RBC, HGB, HCT, PLT, MCV, MCH, MCHC, RDW, LYMPHSABS, MONOABS, EOSABS, BASOSABS  No results found for: POCLITH, LITHIUM   No results found for: PHENYTOIN, PHENOBARB, VALPROATE, CBMZ   .res Assessment: Plan:   Will continue current plan of care since patient reports that mood, anxiety, and insomnia have significantly improved with current medications and denies any tolerability issues.  Discussed that doxepin could potentially be contributing to weight gain, however patient reports that benefits are currently outweighing potential side effects/risks. Will continue doxepin 100 mg at bedtime for insomnia and depression. Continue alprazolam for anxiety and insomnia. Continue Ambien CR 12.5 mg at bedtime for insomnia. Patient to follow-up in 6 months or sooner if clinically indicated. Patient advised to contact office with any questions, adverse effects, or acute worsening in signs and symptoms.  Angie Liu was seen today for follow-up.  Diagnoses and all orders for this visit:  Primary insomnia -     doxepin (SINEQUAN) 100 MG capsule; Take 1 capsule (100 mg  total) by mouth at bedtime. -     ALPRAZolam (XANAX) 0.5 MG tablet; Take 1 tab po QHS and 1 tab po qd prn anxiety -     zolpidem (AMBIEN CR) 12.5 MG CR tablet; Take 1 tablet (12.5 mg total) by mouth at bedtime.  Major depressive disorder, recurrent episode, in full remission ([place]) -     doxepin (SINEQUAN) 100 MG capsule; Take 1 capsule (100 mg total) by mouth at bedtime.  Generalized anxiety disorder -     ALPRAZolam (XANAX) 0.5 MG tablet; Take 1 tab po QHS and 1 tab po qd prn anxiety     Please see After Visit Summary for patient specific instructions.  Future Appointments  Date Time Provider [place]  02/19/2020  1:00 PM Thayer Headings, PMHNP [facility] None    No orders of the defined types were placed in this encounter.   -------------------------------

## 2019-09-17 DIAGNOSIS — Z974 Presence of external hearing-aid: Secondary | ICD-10-CM | POA: Diagnosis not present

## 2019-09-17 DIAGNOSIS — H6121 Impacted cerumen, right ear: Secondary | ICD-10-CM | POA: Diagnosis not present

## 2019-09-17 DIAGNOSIS — Z7289 Other problems related to lifestyle: Secondary | ICD-10-CM | POA: Diagnosis not present

## 2019-09-17 DIAGNOSIS — H9113 Presbycusis, bilateral: Secondary | ICD-10-CM | POA: Diagnosis not present

## 2019-09-18 DIAGNOSIS — H353221 Exudative age-related macular degeneration, left eye, with active choroidal neovascularization: Secondary | ICD-10-CM | POA: Diagnosis not present

## 2019-09-18 DIAGNOSIS — H26491 Other secondary cataract, right eye: Secondary | ICD-10-CM | POA: Diagnosis not present

## 2019-09-18 DIAGNOSIS — H52203 Unspecified astigmatism, bilateral: Secondary | ICD-10-CM | POA: Diagnosis not present

## 2019-09-18 DIAGNOSIS — H353112 Nonexudative age-related macular degeneration, right eye, intermediate dry stage: Secondary | ICD-10-CM | POA: Diagnosis not present

## 2019-10-01 DIAGNOSIS — H353221 Exudative age-related macular degeneration, left eye, with active choroidal neovascularization: Secondary | ICD-10-CM | POA: Diagnosis not present

## 2019-10-01 DIAGNOSIS — H43812 Vitreous degeneration, left eye: Secondary | ICD-10-CM | POA: Diagnosis not present

## 2019-10-01 DIAGNOSIS — H353112 Nonexudative age-related macular degeneration, right eye, intermediate dry stage: Secondary | ICD-10-CM | POA: Diagnosis not present

## 2019-10-04 DIAGNOSIS — H26491 Other secondary cataract, right eye: Secondary | ICD-10-CM | POA: Diagnosis not present

## 2019-10-10 DIAGNOSIS — I87021 Postthrombotic syndrome with inflammation of right lower extremity: Secondary | ICD-10-CM | POA: Diagnosis not present

## 2019-10-10 DIAGNOSIS — I890 Lymphedema, not elsewhere classified: Secondary | ICD-10-CM | POA: Diagnosis not present

## 2019-10-15 DIAGNOSIS — M5030 Other cervical disc degeneration, unspecified cervical region: Secondary | ICD-10-CM | POA: Diagnosis not present

## 2019-10-15 DIAGNOSIS — M9903 Segmental and somatic dysfunction of lumbar region: Secondary | ICD-10-CM | POA: Diagnosis not present

## 2019-10-15 DIAGNOSIS — M9901 Segmental and somatic dysfunction of cervical region: Secondary | ICD-10-CM | POA: Diagnosis not present

## 2019-10-15 DIAGNOSIS — M5136 Other intervertebral disc degeneration, lumbar region: Secondary | ICD-10-CM | POA: Diagnosis not present

## 2019-10-15 DIAGNOSIS — M9902 Segmental and somatic dysfunction of thoracic region: Secondary | ICD-10-CM | POA: Diagnosis not present

## 2019-10-15 DIAGNOSIS — M9904 Segmental and somatic dysfunction of sacral region: Secondary | ICD-10-CM | POA: Diagnosis not present

## 2019-10-19 DIAGNOSIS — M5030 Other cervical disc degeneration, unspecified cervical region: Secondary | ICD-10-CM | POA: Diagnosis not present

## 2019-10-19 DIAGNOSIS — M5136 Other intervertebral disc degeneration, lumbar region: Secondary | ICD-10-CM | POA: Diagnosis not present

## 2019-10-19 DIAGNOSIS — M9901 Segmental and somatic dysfunction of cervical region: Secondary | ICD-10-CM | POA: Diagnosis not present

## 2019-10-19 DIAGNOSIS — M9903 Segmental and somatic dysfunction of lumbar region: Secondary | ICD-10-CM | POA: Diagnosis not present

## 2019-10-19 DIAGNOSIS — M9902 Segmental and somatic dysfunction of thoracic region: Secondary | ICD-10-CM | POA: Diagnosis not present

## 2019-10-19 DIAGNOSIS — M9904 Segmental and somatic dysfunction of sacral region: Secondary | ICD-10-CM | POA: Diagnosis not present

## 2019-10-22 DIAGNOSIS — M5030 Other cervical disc degeneration, unspecified cervical region: Secondary | ICD-10-CM | POA: Diagnosis not present

## 2019-10-22 DIAGNOSIS — M5136 Other intervertebral disc degeneration, lumbar region: Secondary | ICD-10-CM | POA: Diagnosis not present

## 2019-10-22 DIAGNOSIS — M9904 Segmental and somatic dysfunction of sacral region: Secondary | ICD-10-CM | POA: Diagnosis not present

## 2019-10-22 DIAGNOSIS — M9902 Segmental and somatic dysfunction of thoracic region: Secondary | ICD-10-CM | POA: Diagnosis not present

## 2019-10-22 DIAGNOSIS — M9903 Segmental and somatic dysfunction of lumbar region: Secondary | ICD-10-CM | POA: Diagnosis not present

## 2019-10-22 DIAGNOSIS — M9901 Segmental and somatic dysfunction of cervical region: Secondary | ICD-10-CM | POA: Diagnosis not present

## 2019-10-29 DIAGNOSIS — N951 Menopausal and female climacteric states: Secondary | ICD-10-CM | POA: Diagnosis not present

## 2019-10-31 DIAGNOSIS — M9903 Segmental and somatic dysfunction of lumbar region: Secondary | ICD-10-CM | POA: Diagnosis not present

## 2019-10-31 DIAGNOSIS — M5030 Other cervical disc degeneration, unspecified cervical region: Secondary | ICD-10-CM | POA: Diagnosis not present

## 2019-10-31 DIAGNOSIS — M5136 Other intervertebral disc degeneration, lumbar region: Secondary | ICD-10-CM | POA: Diagnosis not present

## 2019-10-31 DIAGNOSIS — M9902 Segmental and somatic dysfunction of thoracic region: Secondary | ICD-10-CM | POA: Diagnosis not present

## 2019-10-31 DIAGNOSIS — M9904 Segmental and somatic dysfunction of sacral region: Secondary | ICD-10-CM | POA: Diagnosis not present

## 2019-10-31 DIAGNOSIS — M9901 Segmental and somatic dysfunction of cervical region: Secondary | ICD-10-CM | POA: Diagnosis not present

## 2019-11-06 DIAGNOSIS — H353122 Nonexudative age-related macular degeneration, left eye, intermediate dry stage: Secondary | ICD-10-CM | POA: Diagnosis not present

## 2019-11-06 DIAGNOSIS — H353112 Nonexudative age-related macular degeneration, right eye, intermediate dry stage: Secondary | ICD-10-CM | POA: Diagnosis not present

## 2019-11-06 DIAGNOSIS — H43812 Vitreous degeneration, left eye: Secondary | ICD-10-CM | POA: Diagnosis not present

## 2019-11-06 DIAGNOSIS — H353221 Exudative age-related macular degeneration, left eye, with active choroidal neovascularization: Secondary | ICD-10-CM | POA: Diagnosis not present

## 2019-11-16 DIAGNOSIS — I890 Lymphedema, not elsewhere classified: Secondary | ICD-10-CM | POA: Diagnosis not present

## 2019-11-27 ENCOUNTER — Telehealth: Payer: Self-pay | Admitting: Psychiatry

## 2019-11-27 DIAGNOSIS — F3342 Major depressive disorder, recurrent, in full remission: Secondary | ICD-10-CM

## 2019-11-27 DIAGNOSIS — F5101 Primary insomnia: Secondary | ICD-10-CM

## 2019-11-27 MED ORDER — DOXEPIN HCL 75 MG PO CAPS: 75.0000 mg | ORAL_CAPSULE | Freq: Every day | ORAL | 0 refills | Status: DC

## 2019-11-27 NOTE — Telephone Encounter (Signed)
Left voicemail to call back in the morning to discuss her medication

## 2019-11-27 NOTE — Telephone Encounter (Signed)
Pt wants to reduce her medication. She says she read that Doxepin can cause tinnitus- and she has that anyway but it is getting worse. Also, she'd like to take 1/2 the amount of Xanax. Upset in family, and she isn't sleeping well due to that. Thinks if she can get healthier than maybe she could take less medication.  Previous physicians have told her to watch for odors, and she has a different smell in her urine. Not sure why.

## 2019-11-29 NOTE — Telephone Encounter (Signed)
Left voice mail to call back 

## 2019-11-30 NOTE — Telephone Encounter (Signed)
Spoke with patient and informed her of the decrease with Doxepin 75 mg q hs for 30 days, advised to call back with worsening symptoms or concerns. She doesn't feel like her urine odor is related to a UTI but I advised her to keep an eye on it. She agreed and also mentioned she has an apt on Monday with [facility] for counseling.

## 2019-12-03 ENCOUNTER — Encounter: Payer: Self-pay | Admitting: Addiction (Substance Use Disorder)

## 2019-12-03 ENCOUNTER — Other Ambulatory Visit: Payer: Self-pay

## 2019-12-03 ENCOUNTER — Ambulatory Visit (INDEPENDENT_AMBULATORY_CARE_PROVIDER_SITE_OTHER): Payer: PPO | Admitting: Addiction (Substance Use Disorder)

## 2019-12-03 DIAGNOSIS — F4323 Adjustment disorder with mixed anxiety and depressed mood: Secondary | ICD-10-CM

## 2019-12-03 DIAGNOSIS — F411 Generalized anxiety disorder: Secondary | ICD-10-CM
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
## 2019-12-06 DIAGNOSIS — H353122 Nonexudative age-related macular degeneration, left eye, intermediate dry stage: Secondary | ICD-10-CM | POA: Diagnosis not present

## 2019-12-06 DIAGNOSIS — H35722 Serous detachment of retinal pigment epithelium, left eye: Secondary | ICD-10-CM | POA: Diagnosis not present

## 2019-12-06 DIAGNOSIS — H353221 Exudative age-related macular degeneration, left eye, with active choroidal neovascularization: Secondary | ICD-10-CM | POA: Diagnosis not present

## 2019-12-06 DIAGNOSIS — H43812 Vitreous degeneration, left eye: Secondary | ICD-10-CM | POA: Diagnosis not present

## 2019-12-12 DIAGNOSIS — H9313 Tinnitus, bilateral: Secondary | ICD-10-CM | POA: Diagnosis not present

## 2019-12-12 DIAGNOSIS — H903 Sensorineural hearing loss, bilateral: Secondary | ICD-10-CM | POA: Diagnosis not present

## 2019-12-12 DIAGNOSIS — H9113 Presbycusis, bilateral: Secondary | ICD-10-CM | POA: Diagnosis not present

## 2019-12-14 ENCOUNTER — Ambulatory Visit: Payer: PPO | Admitting: Addiction (Substance Use Disorder)

## 2019-12-21 ENCOUNTER — Other Ambulatory Visit: Payer: Self-pay

## 2019-12-21 ENCOUNTER — Encounter: Payer: Self-pay | Admitting: Addiction (Substance Use Disorder)

## 2019-12-21 ENCOUNTER — Ambulatory Visit (INDEPENDENT_AMBULATORY_CARE_PROVIDER_SITE_OTHER): Payer: PPO | Admitting: Addiction (Substance Use Disorder)

## 2019-12-21 DIAGNOSIS — F4323 Adjustment disorder with mixed anxiety and depressed mood: Secondary | ICD-10-CM | POA: Diagnosis not present

## 2019-12-21 DIAGNOSIS — F411 Generalized anxiety disorder: Secondary | ICD-10-CM
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
## 2019-12-24 ENCOUNTER — Other Ambulatory Visit: Payer: Self-pay

## 2019-12-24 ENCOUNTER — Ambulatory Visit (INDEPENDENT_AMBULATORY_CARE_PROVIDER_SITE_OTHER): Payer: PPO | Admitting: Addiction (Substance Use Disorder)

## 2019-12-24 DIAGNOSIS — F411 Generalized anxiety disorder: Secondary | ICD-10-CM

## 2019-12-24 DIAGNOSIS — F4323 Adjustment disorder with mixed anxiety and depressed mood: Secondary | ICD-10-CM
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
## 2019-12-25 DIAGNOSIS — N951 Menopausal and female climacteric states: Secondary | ICD-10-CM | POA: Diagnosis not present

## 2019-12-26 DIAGNOSIS — M9903 Segmental and somatic dysfunction of lumbar region: Secondary | ICD-10-CM | POA: Diagnosis not present

## 2019-12-26 DIAGNOSIS — M5134 Other intervertebral disc degeneration, thoracic region: Secondary | ICD-10-CM | POA: Diagnosis not present

## 2019-12-26 DIAGNOSIS — M5136 Other intervertebral disc degeneration, lumbar region: Secondary | ICD-10-CM | POA: Diagnosis not present

## 2019-12-26 DIAGNOSIS — M5030 Other cervical disc degeneration, unspecified cervical region: Secondary | ICD-10-CM | POA: Diagnosis not present

## 2019-12-26 DIAGNOSIS — M9902 Segmental and somatic dysfunction of thoracic region: Secondary | ICD-10-CM | POA: Diagnosis not present

## 2019-12-26 DIAGNOSIS — M9901 Segmental and somatic dysfunction of cervical region: Secondary | ICD-10-CM | POA: Diagnosis not present

## 2019-12-31 DIAGNOSIS — M5136 Other intervertebral disc degeneration, lumbar region: Secondary | ICD-10-CM | POA: Diagnosis not present

## 2019-12-31 DIAGNOSIS — M9902 Segmental and somatic dysfunction of thoracic region: Secondary | ICD-10-CM | POA: Diagnosis not present

## 2019-12-31 DIAGNOSIS — M5134 Other intervertebral disc degeneration, thoracic region: Secondary | ICD-10-CM | POA: Diagnosis not present

## 2019-12-31 DIAGNOSIS — M9903 Segmental and somatic dysfunction of lumbar region: Secondary | ICD-10-CM | POA: Diagnosis not present

## 2019-12-31 DIAGNOSIS — M5030 Other cervical disc degeneration, unspecified cervical region: Secondary | ICD-10-CM | POA: Diagnosis not present

## 2019-12-31 DIAGNOSIS — M9901 Segmental and somatic dysfunction of cervical region: Secondary | ICD-10-CM | POA: Diagnosis not present

## 2020-01-01 DIAGNOSIS — H903 Sensorineural hearing loss, bilateral: Secondary | ICD-10-CM | POA: Diagnosis not present

## 2020-01-01 DIAGNOSIS — H9313 Tinnitus, bilateral: Secondary | ICD-10-CM | POA: Diagnosis not present

## 2020-01-02 ENCOUNTER — Other Ambulatory Visit: Payer: Self-pay

## 2020-01-02 ENCOUNTER — Ambulatory Visit (INDEPENDENT_AMBULATORY_CARE_PROVIDER_SITE_OTHER): Payer: PPO | Admitting: Addiction (Substance Use Disorder)

## 2020-01-02 DIAGNOSIS — F411 Generalized anxiety disorder: Secondary | ICD-10-CM | POA: Diagnosis not present
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
## 2020-01-07 DIAGNOSIS — M5030 Other cervical disc degeneration, unspecified cervical region: Secondary | ICD-10-CM | POA: Diagnosis not present

## 2020-01-07 DIAGNOSIS — M9901 Segmental and somatic dysfunction of cervical region: Secondary | ICD-10-CM | POA: Diagnosis not present

## 2020-01-07 DIAGNOSIS — M5134 Other intervertebral disc degeneration, thoracic region: Secondary | ICD-10-CM | POA: Diagnosis not present

## 2020-01-07 DIAGNOSIS — M9902 Segmental and somatic dysfunction of thoracic region: Secondary | ICD-10-CM | POA: Diagnosis not present

## 2020-01-07 DIAGNOSIS — M9903 Segmental and somatic dysfunction of lumbar region: Secondary | ICD-10-CM | POA: Diagnosis not present

## 2020-01-07 DIAGNOSIS — M5136 Other intervertebral disc degeneration, lumbar region: Secondary | ICD-10-CM | POA: Diagnosis not present

## 2020-01-08 DIAGNOSIS — H43812 Vitreous degeneration, left eye: Secondary | ICD-10-CM | POA: Diagnosis not present

## 2020-01-08 DIAGNOSIS — H353122 Nonexudative age-related macular degeneration, left eye, intermediate dry stage: Secondary | ICD-10-CM | POA: Diagnosis not present

## 2020-01-08 DIAGNOSIS — H35722 Serous detachment of retinal pigment epithelium, left eye: Secondary | ICD-10-CM | POA: Diagnosis not present

## 2020-01-08 DIAGNOSIS — H353221 Exudative age-related macular degeneration, left eye, with active choroidal neovascularization: Secondary | ICD-10-CM | POA: Diagnosis not present

## 2020-01-09 ENCOUNTER — Other Ambulatory Visit: Payer: Self-pay

## 2020-01-09 ENCOUNTER — Encounter: Payer: Self-pay | Admitting: Addiction (Substance Use Disorder)

## 2020-01-09 ENCOUNTER — Ambulatory Visit (INDEPENDENT_AMBULATORY_CARE_PROVIDER_SITE_OTHER): Payer: PPO | Admitting: Addiction (Substance Use Disorder)

## 2020-01-09 DIAGNOSIS — F411 Generalized anxiety disorder: Secondary | ICD-10-CM | POA: Diagnosis not present
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
## 2020-01-14 DIAGNOSIS — I890 Lymphedema, not elsewhere classified: Secondary | ICD-10-CM | POA: Diagnosis not present

## 2020-01-16 DIAGNOSIS — Z85828 Personal history of other malignant neoplasm of skin: Secondary | ICD-10-CM | POA: Diagnosis not present

## 2020-01-16 DIAGNOSIS — L820 Inflamed seborrheic keratosis: Secondary | ICD-10-CM | POA: Diagnosis not present

## 2020-01-17 ENCOUNTER — Ambulatory Visit (INDEPENDENT_AMBULATORY_CARE_PROVIDER_SITE_OTHER): Payer: PPO | Admitting: Addiction (Substance Use Disorder)

## 2020-01-17 ENCOUNTER — Other Ambulatory Visit: Payer: Self-pay

## 2020-01-17 DIAGNOSIS — F4323 Adjustment disorder with mixed anxiety and depressed mood: Secondary | ICD-10-CM
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
## 2020-01-24 ENCOUNTER — Ambulatory Visit (INDEPENDENT_AMBULATORY_CARE_PROVIDER_SITE_OTHER): Payer: PPO | Admitting: Addiction (Substance Use Disorder)

## 2020-01-24 ENCOUNTER — Encounter: Payer: Self-pay | Admitting: Addiction (Substance Use Disorder)

## 2020-01-24 ENCOUNTER — Other Ambulatory Visit: Payer: Self-pay

## 2020-01-24 DIAGNOSIS — F411 Generalized anxiety disorder: Secondary | ICD-10-CM | POA: Diagnosis not present
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
## 2020-01-30 ENCOUNTER — Ambulatory Visit (INDEPENDENT_AMBULATORY_CARE_PROVIDER_SITE_OTHER): Payer: PPO | Admitting: Addiction (Substance Use Disorder)

## 2020-01-30 ENCOUNTER — Other Ambulatory Visit: Payer: Self-pay

## 2020-01-30 ENCOUNTER — Encounter: Payer: Self-pay | Admitting: Addiction (Substance Use Disorder)

## 2020-01-30 DIAGNOSIS — F411 Generalized anxiety disorder: Secondary | ICD-10-CM | POA: Diagnosis not present
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
## 2020-02-04 DIAGNOSIS — M5030 Other cervical disc degeneration, unspecified cervical region: Secondary | ICD-10-CM | POA: Diagnosis not present

## 2020-02-04 DIAGNOSIS — M9903 Segmental and somatic dysfunction of lumbar region: Secondary | ICD-10-CM | POA: Diagnosis not present

## 2020-02-04 DIAGNOSIS — M9902 Segmental and somatic dysfunction of thoracic region: Secondary | ICD-10-CM | POA: Diagnosis not present

## 2020-02-04 DIAGNOSIS — M9901 Segmental and somatic dysfunction of cervical region: Secondary | ICD-10-CM | POA: Diagnosis not present

## 2020-02-04 DIAGNOSIS — M5136 Other intervertebral disc degeneration, lumbar region: Secondary | ICD-10-CM | POA: Diagnosis not present

## 2020-02-04 DIAGNOSIS — M9904 Segmental and somatic dysfunction of sacral region: Secondary | ICD-10-CM | POA: Diagnosis not present

## 2020-02-05 DIAGNOSIS — N951 Menopausal and female climacteric states: Secondary | ICD-10-CM | POA: Diagnosis not present

## 2020-02-06 ENCOUNTER — Ambulatory Visit: Payer: PPO | Admitting: Addiction (Substance Use Disorder)

## 2020-02-07 ENCOUNTER — Ambulatory Visit (INDEPENDENT_AMBULATORY_CARE_PROVIDER_SITE_OTHER): Payer: PPO | Admitting: Ophthalmology

## 2020-02-07 ENCOUNTER — Other Ambulatory Visit: Payer: Self-pay

## 2020-02-07 ENCOUNTER — Encounter (INDEPENDENT_AMBULATORY_CARE_PROVIDER_SITE_OTHER): Payer: Self-pay | Admitting: Ophthalmology

## 2020-02-07 DIAGNOSIS — H353122 Nonexudative age-related macular degeneration, left eye, intermediate dry stage: Secondary | ICD-10-CM

## 2020-02-07 DIAGNOSIS — H353112 Nonexudative age-related macular degeneration, right eye, intermediate dry stage: Secondary | ICD-10-CM | POA: Diagnosis not present

## 2020-02-07 DIAGNOSIS — H35722 Serous detachment of retinal pigment epithelium, left eye: Secondary | ICD-10-CM | POA: Diagnosis not present

## 2020-02-07 DIAGNOSIS — H43812 Vitreous degeneration, left eye: Secondary | ICD-10-CM | POA: Diagnosis not present

## 2020-02-07 DIAGNOSIS — H353221 Exudative age-related macular degeneration, left eye, with active choroidal neovascularization: Secondary | ICD-10-CM | POA: Diagnosis not present

## 2020-02-07 MED ORDER — AFLIBERCEPT 2MG/0.05ML IZ SOLN FOR KALEIDOSCOPE
2.0000 mg | INTRAVITREAL | Status: AC | PRN
  Administered 2020-02-07: 2 mg via INTRAVITREAL

## 2020-02-07 NOTE — Progress Notes (Signed)
02/07/2020     CHIEF COMPLAINT Patient presents for Retina Follow Up   HISTORY OF PRESENT ILLNESS: Angie Liu is a 74 y.o. female who presents to the clinic today for:   HPI    Retina Follow Up    Patient presents with  Wet AMD.  In left eye.  This started 4 weeks ago.  Severity is mild.  Duration of 4 weeks.  Since onset it is stable.          Comments    4 Week AMD F/U OS, poss Eylea OS   S/p Avastin OS for 3 doses, and most recently 1 month status post Eylea No. 1  Pt denies noticeable changes to [place] OU since last visit. Pt denies ocular pain, flashes of light, or floaters OU.         Last edited by Hurman Horn, MD on 02/07/2020 11:34 AM. (History)      Referring physician: Lanice Shirts, MD [address] [place] [id number]  HISTORICAL INFORMATION:   Selected notes from the [facility]: No current outpatient medications on file. (Ophthalmic Drugs)   No current facility-administered medications for this visit. (Ophthalmic Drugs)   Current Outpatient Medications (Other)  Medication Sig  . fluocinonide (LIDEX) 0.05 % external solution fluocinonide 0.05 % topical solution  . ALPRAZolam (XANAX) 0.5 MG tablet Take 1 tab po QHS and 1 tab po qd prn anxiety  . chlorpheniramine (CHLOR-TRIMETON) 4 MG tablet Take 4 mg by mouth 2 (two) times daily as needed for allergies.   . Cholecalciferol (VITAMIN D3) 1000 units CAPS Take by mouth.  . doxepin (SINEQUAN) 100 MG capsule Take 100 mg by mouth at bedtime.  Marland Kitchen doxepin (SINEQUAN) 75 MG capsule Take 1 capsule (75 mg total) by mouth at bedtime.  . Multiple Vitamins-Minerals (PRESERVISION AREDS PO) Take 1 tablet by mouth 2 (two) times daily.  . mupirocin ointment (BACTROBAN) 2 % mupirocin 2 % topical ointment  . OVER THE COUNTER MEDICATION 4 capsules daily. Calm CP, takes 1 breakfast, 1 at dinner and 2 at bedtime  . OVER THE COUNTER MEDICATION 1 capsule at bedtime.  Kavinace capusle  . OVER THE COUNTER MEDICATION Place 1 tablet under the tongue every morning. [place]  . OVER THE COUNTER MEDICATION Place 1 tablet under the tongue. Butyra  . OVER THE COUNTER MEDICATION Alpha gabba  . Progesterone Micronized (PROGESTERONE PO) Take 2 capsules by mouth at bedtime. 1:1 SR compound medication at [place].  Marland Kitchen zolpidem (AMBIEN CR) 12.5 MG CR tablet Take 1 tablet (12.5 mg total) by mouth at bedtime.   No current facility-administered medications for this visit. (Other)      REVIEW OF SYSTEMS:    ALLERGIES Allergies  Allergen Reactions  . Latex Rash    PAST MEDICAL HISTORY Past Medical History:  Diagnosis Date  . Anxiety   . Depression   . Hayfever    Past Surgical History:  Procedure Laterality Date  . TONSILLECTOMY      FAMILY HISTORY Family History  Problem Relation Age of Onset  . Cancer Mother        colon  . Depression Mother   . Diabetes Father   . Depression Father   . Depression Son   . Depression Paternal Uncle     SOCIAL HISTORY Social History   Tobacco Use  . Smoking status: Never Smoker  . Smokeless tobacco:  Never Used  Substance Use Topics  . Alcohol use: Yes  . Drug use: No         OPHTHALMIC EXAM:  Base Eye Exam    Visual Acuity (Snellen - Linear)      Right Left   Dist cc 20/25 -1 20/25 +2   Correction: Glasses       Tonometry (Tonopen, 10:45 AM)      Right Left   Pressure 09 09       Pupils      Pupils Dark Light Shape React APD   Right PERRL 5 4 Round Brisk None   Left PERRL 5 4 Round Brisk None       Visual Fields (Counting fingers)      Left Right    Full Full       Extraocular Movement      Right Left    Full Full       Neuro/Psych    Oriented x3: Yes   Mood/Affect: Normal       Dilation    Left eye: 1.0% Mydriacyl, 2.5% Phenylephrine @ 10:45 AM        Slit Lamp and Fundus Exam    Slit Lamp Exam      Right Left   Lids/Lashes Normal Normal   Conjunctiva/Sclera White  and quiet White and quiet   Cornea Clear Clear   Anterior Chamber Deep and quiet Deep and quiet   Iris Round and reactive Round and reactive   Lens Posterior chamber intraocular lens Posterior chamber intraocular lens   Vitreous Normal Normal       Fundus Exam      Right Left   Disc  Normal   C/D Ratio  0.5   Macula  Atrophy, Retinal pigment epithelial detachment, [place]   Vessels  Normal   Periphery  Normal          IMAGING AND PROCEDURES  Imaging and Procedures for @TODAY @  OCT, Retina - OU - Both Eyes       OCT was not performed and will not be billed.  Equipment t failure.       Intravitreal Injection, Pharmacologic Agent - OS - Left Eye       Time Out 02/07/2020. 11:38 AM. Confirmed correct patient, procedure, site, and patient consented.   Anesthesia Topical anesthesia was used. Anesthetic medications included Akten 3.5%.   Procedure Preparation included 5% betadine to ocular surface, 10% betadine to eyelids, Ofloxacin . A 30 gauge needle was used.   Injection:  2 mg aflibercept Alfonse Flavors) SOLN   NDC: [id number], Lot: [id number]   Route: Intravitreal, Site: Left Eye, Waste: 0 mg  Post-op Post injection exam found visual acuity of at least counting fingers. The patient tolerated the procedure well. There were no complications. The patient received written and verbal post procedure care education. Post injection medications were not given.                 ASSESSMENT/PLAN:  @PROBAPNOTE @    ICD-10-CM   1. Exudative age-related macular degeneration of left eye with active choroidal neovascularization (HCC)  H35.3221 OCT, Retina - OU - Both Eyes    Intravitreal Injection, Pharmacologic Agent - OS - Left Eye    aflibercept (EYLEA) SOLN 2 mg  2. 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 Posterior vitreous detachment of left eye  H43.812   4. Intermediate stage nonexudative age-related macular degeneration of right eye  H35.3112   5. Intermediate stage nonexudative age-related macular degeneration of left eye  H35.3122     1.  2.  3.  Ophthalmic Meds Ordered this visit:  Meds ordered this encounter  Medications  . aflibercept (EYLEA) SOLN 2 mg       Return in about 5 weeks (around 03/13/2020) for EYLEA OCT, OS.  There are no Patient Instructions on file for this visit.   Explained the diagnoses, plan, and follow up with the patient and they expressed understanding.  Patient expressed understanding of the importance of proper follow up care.   Clent Demark Rankin M.D. Diseases & Surgery of the Retina and Vitreous Retina & Diabetic [place] @TODAY @     Abbreviations: M myopia (nearsighted); A astigmatism; H hyperopia (farsighted); P presbyopia; Mrx spectacle prescription;  CTL contact lenses; OD right eye; OS left eye; OU both eyes  XT exotropia; ET esotropia; PEK punctate epithelial keratitis; PEE punctate epithelial erosions; DES dry eye syndrome; MGD meibomian gland dysfunction; ATs artificial tears; PFAT's preservative free artificial tears; [place] nuclear sclerotic cataract; PSC posterior subcapsular cataract; ERM epi-retinal membrane; PVD posterior vitreous detachment; RD retinal detachment; DM diabetes mellitus; DR diabetic retinopathy; NPDR non-proliferative diabetic retinopathy; PDR proliferative diabetic retinopathy; CSME clinically significant macular edema; DME diabetic macular edema; dbh dot blot hemorrhages; CWS cotton wool spot; POAG primary open angle glaucoma; C/D cup-to-disc ratio; HVF humphrey visual field; GVF goldmann visual field; OCT optical coherence tomography; IOP intraocular pressure; BRVO Branch retinal vein occlusion; CRVO central retinal vein occlusion; CRAO central retinal artery occlusion; BRAO branch retinal artery occlusion; RT retinal tear; SB scleral buckle; PPV pars plana vitrectomy; VH Vitreous hemorrhage; PRP panretinal laser photocoagulation; IVK intravitreal kenalog;  VMT vitreomacular traction; MH Macular hole;  NVD neovascularization of the disc; NVE neovascularization elsewhere; AREDS age related eye disease study; ARMD age related macular degeneration; POAG primary open angle glaucoma; EBMD epithelial/anterior basement membrane dystrophy; ACIOL anterior chamber intraocular lens; IOL intraocular lens; PCIOL posterior chamber intraocular lens; Phaco/IOL phacoemulsification with intraocular lens placement; [place] photorefractive keratectomy; LASIK laser assisted in situ keratomileusis; HTN hypertension; DM diabetes mellitus; COPD chronic obstructive pulmonary disease

## 2020-02-07 NOTE — Progress Notes (Signed)
Order(s) created erroneously. Erroneous order ID: [id number]  Order canceled by: Milas Hock  Order cancel date/time: 02/07/2020 2:13 PM

## 2020-02-08 DIAGNOSIS — M9903 Segmental and somatic dysfunction of lumbar region: Secondary | ICD-10-CM | POA: Diagnosis not present

## 2020-02-08 DIAGNOSIS — M5136 Other intervertebral disc degeneration, lumbar region: Secondary | ICD-10-CM | POA: Diagnosis not present

## 2020-02-08 DIAGNOSIS — M9901 Segmental and somatic dysfunction of cervical region: Secondary | ICD-10-CM | POA: Diagnosis not present

## 2020-02-08 DIAGNOSIS — M9904 Segmental and somatic dysfunction of sacral region: Secondary | ICD-10-CM | POA: Diagnosis not present

## 2020-02-08 DIAGNOSIS — M5030 Other cervical disc degeneration, unspecified cervical region: Secondary | ICD-10-CM | POA: Diagnosis not present

## 2020-02-08 DIAGNOSIS — M9902 Segmental and somatic dysfunction of thoracic region: Secondary | ICD-10-CM | POA: Diagnosis not present

## 2020-02-13 ENCOUNTER — Encounter: Payer: Self-pay | Admitting: Addiction (Substance Use Disorder)

## 2020-02-13 ENCOUNTER — Ambulatory Visit (INDEPENDENT_AMBULATORY_CARE_PROVIDER_SITE_OTHER): Payer: PPO | Admitting: Addiction (Substance Use Disorder)

## 2020-02-13 ENCOUNTER — Other Ambulatory Visit: Payer: Self-pay

## 2020-02-13 DIAGNOSIS — F411 Generalized anxiety disorder: Secondary | ICD-10-CM | POA: Diagnosis not present

## 2020-02-13 DIAGNOSIS — F4323 Adjustment disorder with mixed anxiety and depressed mood: Secondary | ICD-10-CM
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
## 2020-02-15 DIAGNOSIS — M9902 Segmental and somatic dysfunction of thoracic region: Secondary | ICD-10-CM | POA: Diagnosis not present

## 2020-02-15 DIAGNOSIS — M9904 Segmental and somatic dysfunction of sacral region: Secondary | ICD-10-CM | POA: Diagnosis not present

## 2020-02-15 DIAGNOSIS — M5030 Other cervical disc degeneration, unspecified cervical region: Secondary | ICD-10-CM | POA: Diagnosis not present

## 2020-02-15 DIAGNOSIS — M9903 Segmental and somatic dysfunction of lumbar region: Secondary | ICD-10-CM | POA: Diagnosis not present

## 2020-02-15 DIAGNOSIS — M9901 Segmental and somatic dysfunction of cervical region: Secondary | ICD-10-CM | POA: Diagnosis not present

## 2020-02-15 DIAGNOSIS — M5136 Other intervertebral disc degeneration, lumbar region: Secondary | ICD-10-CM | POA: Diagnosis not present

## 2020-02-18 DIAGNOSIS — M9903 Segmental and somatic dysfunction of lumbar region: Secondary | ICD-10-CM | POA: Diagnosis not present

## 2020-02-18 DIAGNOSIS — M9901 Segmental and somatic dysfunction of cervical region: Secondary | ICD-10-CM | POA: Diagnosis not present

## 2020-02-18 DIAGNOSIS — M9902 Segmental and somatic dysfunction of thoracic region: Secondary | ICD-10-CM | POA: Diagnosis not present

## 2020-02-18 DIAGNOSIS — M9904 Segmental and somatic dysfunction of sacral region: Secondary | ICD-10-CM | POA: Diagnosis not present

## 2020-02-18 DIAGNOSIS — M5030 Other cervical disc degeneration, unspecified cervical region: Secondary | ICD-10-CM | POA: Diagnosis not present

## 2020-02-18 DIAGNOSIS — M5136 Other intervertebral disc degeneration, lumbar region: Secondary | ICD-10-CM | POA: Diagnosis not present

## 2020-02-19 ENCOUNTER — Other Ambulatory Visit: Payer: Self-pay

## 2020-02-19 ENCOUNTER — Encounter: Payer: Self-pay | Admitting: Psychiatry

## 2020-02-19 ENCOUNTER — Ambulatory Visit (INDEPENDENT_AMBULATORY_CARE_PROVIDER_SITE_OTHER): Payer: PPO | Admitting: Psychiatry

## 2020-02-19 DIAGNOSIS — F411 Generalized anxiety disorder: Secondary | ICD-10-CM

## 2020-02-19 DIAGNOSIS — F3342 Major depressive disorder, recurrent, in full remission: Secondary | ICD-10-CM

## 2020-02-19 DIAGNOSIS — F5101 Primary insomnia: Secondary | ICD-10-CM

## 2020-02-19 MED ORDER — DOXEPIN HCL 75 MG PO CAPS: 75.0000 mg | ORAL_CAPSULE | Freq: Every day | ORAL | 0 refills | Status: DC

## 2020-02-19 MED ORDER — ALPRAZOLAM 0.5 MG PO TABS: ORAL_TABLET | ORAL | 1 refills | Status: DC

## 2020-02-19 MED ORDER — ZOLPIDEM TARTRATE ER 12.5 MG PO TBCR: 12.5000 mg | EXTENDED_RELEASE_TABLET | Freq: Every day | ORAL | 1 refills | Status: DC

## 2020-02-19 NOTE — Progress Notes (Signed)
Angie Liu [id number] [birth year] 74 y.o.  Subjective:   Patient ID:  Angie Liu is a 74 y.o. (DOB August 04, 1946) female.  Chief Complaint:  Chief Complaint  Patient presents with  . Follow-up    h/o Anxiety, Depression, and Insomnia    HPI Angie Liu presents to the office today for follow-up of anxiety, depression, and insomnia. She reports that she has been "generally good." She reports that she started seeing Sammuel Cooper, LCSW after learning that her son had relapse several years ago without their awareness. She reports that she and her husband have been communicating more with their son now. She reports that there was also a disagreement with their daughter about whether they should receive the covid vaccine. Energy has been ok overall. She reports that pharmacy filled Doxepin 100 mg and she has stayed at this dose. She would like to reduce medication in the future, "but maybe not yet." She reports that she just recently filled a 90-day supply of Doxepin 100 mg po QHS. She reports that she has some anxiety with driving/riding on the highway. She reports that she has some excitement about things, such as hosting upcoming event. Sleeping ok with current routine in medication and supplements. Able to stay asleep. Reports that she has not had persistent depression. Occ low motivation. Energy and motivation have been ok overall. Appetite has been good. Concentration is fair.   Had been following different protocols as directed by Dr. Jaynie Collins. She reports that her energy is improved with a ginger tea.   Has been doing some yard work. Planning outdoor event for neighbors. She and her husband are looking forward to a vacation in May. Has been enjoying the piano.   PHQ2-9     Office Visit from 03/06/2015 in Primary Care at [facility] Total Score  0       Review of Systems:  Review of Systems  HENT: Positive for hearing loss and tinnitus.   Eyes: Positive for visual disturbance.   Musculoskeletal: Negative for gait problem.  Neurological: Negative for tremors.  Psychiatric/Behavioral:       Please refer to HPI    Medications: I have reviewed the patient's current medications.  Current Outpatient Medications  Medication Sig Dispense Refill  . FOLIC ACID PO Take by mouth.    . melatonin 3 MG TABS tablet Take 3 mg by mouth at bedtime.    . Multiple Vitamins-Minerals (PRESERVISION AREDS PO) Take 1 tablet by mouth 2 (two) times daily.    . THEANINE PO Take by mouth.    Marland Kitchen UNABLE TO FIND every 30 (thirty) days. Formula A16B    . UNABLE TO FIND Sarspa    . ALPRAZolam (XANAX) 0.5 MG tablet Take 1 tab po QHS and 1 tab po qd prn anxiety 180 tablet 1  . chlorpheniramine (CHLOR-TRIMETON) 4 MG tablet Take 4 mg by mouth 2 (two) times daily as needed for allergies.     . Cholecalciferol (VITAMIN D3) 1000 units CAPS Take by mouth.    . doxepin (SINEQUAN) 75 MG capsule Take 1 capsule (75 mg total) by mouth at bedtime. 90 capsule 0  . fluocinonide (LIDEX) 0.05 % external solution fluocinonide 0.05 % topical solution    . mupirocin ointment (BACTROBAN) 2 % mupirocin 2 % topical ointment    . OVER THE COUNTER MEDICATION 4 capsules daily. Calm CP, takes 1 breakfast, 1 at dinner and 2 at bedtime    . OVER THE COUNTER MEDICATION 1 capsule  at bedtime. Kavinace capusle    . OVER THE COUNTER MEDICATION Place 1 tablet under the tongue every morning. [place]    . OVER THE COUNTER MEDICATION Place 1 tablet under the tongue. Butyra    . OVER THE COUNTER MEDICATION Alpha gabba    . Progesterone Micronized (PROGESTERONE PO) Take 2 capsules by mouth at bedtime. 1:1 SR compound medication at [place].    Derrill Memo ON 03/15/2020] zolpidem (AMBIEN CR) 12.5 MG CR tablet Take 1 tablet (12.5 mg total) by mouth at bedtime. 90 tablet 1   No current facility-administered medications for this visit.    Medication Side Effects: None  Allergies:  Allergies  Allergen Reactions  . Latex Rash    Past  Medical History:  Diagnosis Date  . Anxiety   . Depression   . Hayfever     Family History  Problem Relation Age of Onset  . Cancer Mother        colon  . Depression Mother   . Diabetes Father   . Depression Father   . Depression Son   . Depression Paternal Uncle     Social History   Socioeconomic History  . Marital status: Married    Spouse name: Not on file  . Number of children: Not on file  . Years of education: Not on file  . Highest education level: Not on file  Occupational History  . Not on file  Tobacco Use  . Smoking status: Never Smoker  . Smokeless tobacco: Never Used  Substance and Sexual Activity  . Alcohol use: Yes  . Drug use: No  . Sexual activity: Not on file  Other Topics Concern  . Not on file  Social History Narrative  . Not on file   Social Determinants of Health   Financial Resource Strain:   . Difficulty of Paying Living Expenses:   Food Insecurity:   . Worried About Charity fundraiser in the Last Year:   . Arboriculturist in the Last Year:   Transportation Needs:   . Film/video editor (Medical):   Marland Kitchen Lack of Transportation (Non-Medical):   Physical Activity:   . Days of Exercise per Week:   . Minutes of Exercise per Session:   Stress:   . Feeling of Stress :   Social Connections:   . Frequency of Communication with Friends and Family:   . Frequency of Social Gatherings with Friends and Family:   . Attends Religious Services:   . Active Member of Clubs or Organizations:   . Attends Archivist Meetings:   Marland Kitchen Marital Status:   Intimate Partner Violence:   . Fear of Current or Ex-Partner:   . Emotionally Abused:   Marland Kitchen Physically Abused:   . Sexually Abused:     Past Medical History, Surgical history, Social history, and Family history were reviewed and updated as appropriate.   Please see review of systems for further details on the patient's review from today.   Objective:   Physical Exam:  BP (!) 118/93    Pulse 89   Physical Exam Constitutional:      General: She is not in acute distress. Musculoskeletal:        General: No deformity.  Neurological:     Mental Status: She is alert and oriented to person, place, and time.     Coordination: Coordination normal.  Psychiatric:        Attention and Perception: Attention and perception normal. She  does not perceive auditory or visual hallucinations.        Mood and Affect: Mood is anxious. Mood is not depressed. Affect is not labile, blunt, angry or inappropriate.        Speech: Speech normal.        Behavior: Behavior normal.        Thought Content: Thought content normal. Thought content is not paranoid or delusional. Thought content does not include homicidal or suicidal ideation. Thought content does not include homicidal or suicidal plan.        Cognition and Memory: Cognition and memory normal.        Judgment: Judgment normal.     Comments: Insight intact     Lab Review:  No results found for: NA, K, CL, CO2, GLUCOSE, BUN, CREATININE, CALCIUM, PROT, ALBUMIN, AST, ALT, ALKPHOS, BILITOT, GFRNONAA, GFRAA  No results found for: WBC, RBC, HGB, HCT, PLT, MCV, MCH, MCHC, RDW, LYMPHSABS, MONOABS, EOSABS, BASOSABS  No results found for: POCLITH, LITHIUM   No results found for: PHENYTOIN, PHENOBARB, VALPROATE, CBMZ   .res Assessment: Plan:   Plan to decrease Doxepin to 75 mg po QHS when current supply is completed in approximately 2.5 months. Continue Ambien CR 12.5 mg po QHS for insomnia.  Continue Xanax 0.5 mg po QHS and 1 tab po qd prn anxiety.  Recommend continuing psychotherapy with Sammuel Cooper, LCSW. Patient advised to contact office with any questions, adverse effects, or acute worsening in signs and symptoms.  Angie Liu was seen today for follow-up.  Diagnoses and all orders for this visit:  Primary insomnia -     doxepin (SINEQUAN) 75 MG capsule; Take 1 capsule (75 mg total) by mouth at bedtime. -     ALPRAZolam (XANAX) 0.5 MG  tablet; Take 1 tab po QHS and 1 tab po qd prn anxiety -     zolpidem (AMBIEN CR) 12.5 MG CR tablet; Take 1 tablet (12.5 mg total) by mouth at bedtime.  Major depressive disorder, recurrent episode, in full remission ([place]) -     doxepin (SINEQUAN) 75 MG capsule; Take 1 capsule (75 mg total) by mouth at bedtime.  Generalized anxiety disorder -     ALPRAZolam (XANAX) 0.5 MG tablet; Take 1 tab po QHS and 1 tab po qd prn anxiety     Please see After Visit Summary for patient specific instructions.  Future Appointments  Date Time Provider [place]  02/20/2020 11:00 AM Barnie Del, LCSW CP-CP None  02/27/2020  1:00 PM Barnie Del, LCSW CP-CP None  03/05/2020  3:00 PM Barnie Del, LCSW CP-CP None  03/17/2020 10:15 AM Rankin, Clent Demark, MD RDE-RDE None  03/19/2020  1:00 PM Barnie Del, LCSW CP-CP None  03/26/2020  1:00 PM Barnie Del, LCSW CP-CP None  04/03/2020  8:00 AM Barnie Del, LCSW CP-CP None  04/10/2020  2:00 PM Barnie Del, LCSW CP-CP None  04/17/2020 10:00 AM Barnie Del, LCSW CP-CP None  04/24/2020  2:00 PM Barnie Del, LCSW CP-CP None  04/30/2020  3:00 PM Barnie Del, LCSW CP-CP None  06/03/2020 12:45 PM Thayer Headings, PMHNP CP-CP None    No orders of the defined types were placed in this encounter.   -------------------------------

## 2020-02-20 ENCOUNTER — Encounter: Payer: Self-pay | Admitting: Addiction (Substance Use Disorder)

## 2020-02-20 ENCOUNTER — Ambulatory Visit (INDEPENDENT_AMBULATORY_CARE_PROVIDER_SITE_OTHER): Payer: PPO | Admitting: Addiction (Substance Use Disorder)

## 2020-02-20 DIAGNOSIS — F411 Generalized anxiety disorder: Secondary | ICD-10-CM
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
## 2020-02-25 DIAGNOSIS — M5030 Other cervical disc degeneration, unspecified cervical region: Secondary | ICD-10-CM | POA: Diagnosis not present

## 2020-02-25 DIAGNOSIS — M9901 Segmental and somatic dysfunction of cervical region: Secondary | ICD-10-CM | POA: Diagnosis not present

## 2020-02-25 DIAGNOSIS — M9902 Segmental and somatic dysfunction of thoracic region: Secondary | ICD-10-CM | POA: Diagnosis not present

## 2020-02-25 DIAGNOSIS — M5136 Other intervertebral disc degeneration, lumbar region: Secondary | ICD-10-CM | POA: Diagnosis not present

## 2020-02-25 DIAGNOSIS — M9904 Segmental and somatic dysfunction of sacral region: Secondary | ICD-10-CM | POA: Diagnosis not present

## 2020-02-25 DIAGNOSIS — M9903 Segmental and somatic dysfunction of lumbar region: Secondary | ICD-10-CM | POA: Diagnosis not present

## 2020-02-27 ENCOUNTER — Encounter: Payer: Self-pay | Admitting: Addiction (Substance Use Disorder)

## 2020-02-27 ENCOUNTER — Ambulatory Visit (INDEPENDENT_AMBULATORY_CARE_PROVIDER_SITE_OTHER): Payer: PPO | Admitting: Addiction (Substance Use Disorder)

## 2020-02-27 ENCOUNTER — Other Ambulatory Visit: Payer: Self-pay

## 2020-02-27 DIAGNOSIS — F3342 Major depressive disorder, recurrent, in full remission: Secondary | ICD-10-CM
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
## 2020-02-29 DIAGNOSIS — M9902 Segmental and somatic dysfunction of thoracic region: Secondary | ICD-10-CM | POA: Diagnosis not present

## 2020-02-29 DIAGNOSIS — M9903 Segmental and somatic dysfunction of lumbar region: Secondary | ICD-10-CM | POA: Diagnosis not present

## 2020-02-29 DIAGNOSIS — M5030 Other cervical disc degeneration, unspecified cervical region: Secondary | ICD-10-CM | POA: Diagnosis not present

## 2020-02-29 DIAGNOSIS — M9904 Segmental and somatic dysfunction of sacral region: Secondary | ICD-10-CM | POA: Diagnosis not present

## 2020-02-29 DIAGNOSIS — M5136 Other intervertebral disc degeneration, lumbar region: Secondary | ICD-10-CM | POA: Diagnosis not present

## 2020-02-29 DIAGNOSIS — M9901 Segmental and somatic dysfunction of cervical region: Secondary | ICD-10-CM | POA: Diagnosis not present

## 2020-03-05 ENCOUNTER — Ambulatory Visit (INDEPENDENT_AMBULATORY_CARE_PROVIDER_SITE_OTHER): Payer: PPO | Admitting: Addiction (Substance Use Disorder)

## 2020-03-05 ENCOUNTER — Other Ambulatory Visit: Payer: Self-pay

## 2020-03-05 ENCOUNTER — Encounter: Payer: Self-pay | Admitting: Addiction (Substance Use Disorder)

## 2020-03-05 DIAGNOSIS — F3342 Major depressive disorder, recurrent, in full remission: Secondary | ICD-10-CM | POA: Diagnosis not present

## 2020-03-05 DIAGNOSIS — F5101 Primary insomnia: Secondary | ICD-10-CM | POA: Diagnosis not present
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
## 2020-03-07 DIAGNOSIS — M9902 Segmental and somatic dysfunction of thoracic region: Secondary | ICD-10-CM | POA: Diagnosis not present

## 2020-03-07 DIAGNOSIS — M5136 Other intervertebral disc degeneration, lumbar region: Secondary | ICD-10-CM | POA: Diagnosis not present

## 2020-03-07 DIAGNOSIS — M9904 Segmental and somatic dysfunction of sacral region: Secondary | ICD-10-CM | POA: Diagnosis not present

## 2020-03-07 DIAGNOSIS — M9903 Segmental and somatic dysfunction of lumbar region: Secondary | ICD-10-CM | POA: Diagnosis not present

## 2020-03-07 DIAGNOSIS — M9901 Segmental and somatic dysfunction of cervical region: Secondary | ICD-10-CM | POA: Diagnosis not present

## 2020-03-07 DIAGNOSIS — M5030 Other cervical disc degeneration, unspecified cervical region: Secondary | ICD-10-CM | POA: Diagnosis not present

## 2020-03-13 ENCOUNTER — Encounter (INDEPENDENT_AMBULATORY_CARE_PROVIDER_SITE_OTHER): Payer: PPO | Admitting: Ophthalmology

## 2020-03-17 ENCOUNTER — Other Ambulatory Visit: Payer: Self-pay

## 2020-03-17 ENCOUNTER — Encounter (INDEPENDENT_AMBULATORY_CARE_PROVIDER_SITE_OTHER): Payer: Self-pay | Admitting: Ophthalmology

## 2020-03-17 ENCOUNTER — Ambulatory Visit (INDEPENDENT_AMBULATORY_CARE_PROVIDER_SITE_OTHER): Payer: PPO | Admitting: Ophthalmology

## 2020-03-17 DIAGNOSIS — H35721 Serous detachment of retinal pigment epithelium, right eye: Secondary | ICD-10-CM

## 2020-03-17 DIAGNOSIS — H353221 Exudative age-related macular degeneration, left eye, with active choroidal neovascularization: Secondary | ICD-10-CM

## 2020-03-17 DIAGNOSIS — H353112 Nonexudative age-related macular degeneration, right eye, intermediate dry stage: Secondary | ICD-10-CM

## 2020-03-17 MED ORDER — AFLIBERCEPT 2MG/0.05ML IZ SOLN FOR KALEIDOSCOPE
2.0000 mg | INTRAVITREAL | Status: AC | PRN
  Administered 2020-03-17: 2 mg via INTRAVITREAL

## 2020-03-17 NOTE — Progress Notes (Signed)
03/17/2020     CHIEF COMPLAINT Patient presents for Retina Follow Up   HISTORY OF PRESENT ILLNESS: Angie Liu is a 74 y.o. female who presents to the clinic today for:   HPI    Retina Follow Up    Patient presents with  Wet AMD.  In left eye.  Severity is moderate.  Duration of 5 weeks.  Since onset it is stable.  I, the attending physician,  performed the HPI with the patient and updated documentation appropriately.          Comments    5 Week AMD f\u OS. Possible Eylea OS. OCT  Pt states vision has decreased. Pt has double vision and multiple floaters. Pt states when she watches TV she usually covers OD to see better.       Last edited by Tilda Franco on 03/17/2020 10:24 AM. (History)      Referring physician: Lanice Shirts, MD [address] [place],  [place] [address]  HISTORICAL INFORMATION:   Selected notes from the [facility]: No current outpatient medications on file. (Ophthalmic Drugs)   No current facility-administered medications for this visit. (Ophthalmic Drugs)   Current Outpatient Medications (Other)  Medication Sig  . ALPRAZolam (XANAX) 0.5 MG tablet Take 1 tab po QHS and 1 tab po qd prn anxiety  . chlorpheniramine (CHLOR-TRIMETON) 4 MG tablet Take 4 mg by mouth 2 (two) times daily as needed for allergies.   . Cholecalciferol (VITAMIN D3) 1000 units CAPS Take by mouth.  . doxepin (SINEQUAN) 75 MG capsule Take 1 capsule (75 mg total) by mouth at bedtime.  . fluocinonide (LIDEX) 0.05 % external solution fluocinonide 0.05 % topical solution  . FOLIC ACID PO Take by mouth.  . melatonin 3 MG TABS tablet Take 3 mg by mouth at bedtime.  . Multiple Vitamins-Minerals (PRESERVISION AREDS PO) Take 1 tablet by mouth 2 (two) times daily.  . mupirocin ointment (BACTROBAN) 2 % mupirocin 2 % topical ointment  . OVER THE COUNTER MEDICATION 4 capsules daily. Calm CP, takes 1 breakfast, 1 at dinner and 2 at  bedtime  . OVER THE COUNTER MEDICATION 1 capsule at bedtime. Kavinace capusle  . OVER THE COUNTER MEDICATION Place 1 tablet under the tongue every morning. Seneca  . OVER THE COUNTER MEDICATION Place 1 tablet under the tongue. Butyra  . OVER THE COUNTER MEDICATION Alpha gabba  . Progesterone Micronized (PROGESTERONE PO) Take 2 capsules by mouth at bedtime. 1:1 SR compound medication at [facility].  . THEANINE PO Take by mouth.  Marland Kitchen UNABLE TO FIND every 30 (thirty) days. Formula A16B  . UNABLE TO FIND Sarspa  . zolpidem (AMBIEN CR) 12.5 MG CR tablet Take 1 tablet (12.5 mg total) by mouth at bedtime.   No current facility-administered medications for this visit. (Other)      REVIEW OF SYSTEMS:    ALLERGIES Allergies  Allergen Reactions  . Latex Rash    PAST MEDICAL HISTORY Past Medical History:  Diagnosis Date  . Anxiety   . Depression   . Hayfever    Past Surgical History:  Procedure Laterality Date  . CATARACT EXTRACTION W/PHACO Bilateral 2018  . TONSILLECTOMY      FAMILY HISTORY Family History  Problem Relation Age of Onset  . Cancer Mother        colon  . Depression Mother   . Diabetes Father   . Depression  Father   . Depression Son   . Depression Paternal Uncle     SOCIAL HISTORY Social History   Tobacco Use  . Smoking status: Never Smoker  . Smokeless tobacco: Never Used  Substance Use Topics  . Alcohol use: Yes  . Drug use: No         OPHTHALMIC EXAM:  Base Eye Exam    Visual Acuity (Snellen - Linear)      Right Left   Dist cc 20/30 -1 20/25 -1   Dist ph cc 20/30 +    Correction: Glasses       Tonometry (Tonopen, 10:32 AM)      Right Left   Pressure 8 9       Pupils      Pupils Dark Light Shape React APD   Right PERRL 4 3 Round Brisk None   Left PERRL 4 3 Round Brisk None       Visual Fields (Counting fingers)      Left Right    Full Full       Neuro/Psych    Oriented x3: Yes   Mood/Affect: Normal       Dilation     Left eye: 1.0% Mydriacyl, 2.5% Phenylephrine @ 10:32 AM        Slit Lamp and Fundus Exam    Slit Lamp Exam      Right Left   Lids/Lashes Normal Normal   Conjunctiva/Sclera White and quiet White and quiet   Cornea Clear Clear   Anterior Chamber Deep and quiet Deep and quiet   Iris Round and reactive Round and reactive   Lens Posterior chamber intraocular lens Posterior chamber intraocular lens   Anterior Vitreous Normal Normal       Fundus Exam      Right Left   Posterior Vitreous Normal Posterior vitreous detachment   Disc  Normal   C/D Ratio  0.6   Macula  Atrophy, Retinal pigment epithelial detachment, Drusen   Vessels  Normal   Periphery  Normal          IMAGING AND PROCEDURES  Imaging and Procedures for 03/17/20  OCT, Retina - OU - Both Eyes       Right Eye Central Foveal Thickness: 234. Progression has improved. Findings include normal observations, retinal drusen , pigment epithelial detachment.   Left Eye Quality was good. Scan locations included subfoveal. Central Foveal Thickness: 244. Progression has improved. Findings include abnormal foveal contour, retinal drusen , pigment epithelial detachment.   Notes OD, not currently under therapy directly.  Large submacular drusenoid pigment epithelial detachments present since November 2020 have dramatically improved and nearly resolved as compared to last examination January 08, 2020 in the right eye.  No still no signs of CNVM  OS, large pigment epithelial detachment Seen on last examination have completely resolved after institution of intravitreal Eylea OS.  This contralateral effect on OD of the intravitreal injection of Eylea OS, is quite interesting and suggest beneficial effect from Eylea OD       Intravitreal Injection, Pharmacologic Agent - OS - Left Eye       Time Out 03/17/2020. 11:56 AM. Confirmed correct patient, procedure, site, and patient consented.   Anesthesia Topical anesthesia was used.  Anesthetic medications included Akten 3.5%.   Procedure Preparation included 10% betadine to eyelids, Ofloxacin , Tobramycin 0.3%. A 30 gauge needle was used.   Injection:  2 mg aflibercept Alfonse Flavors) SOLN   NDC: [id number], Lot: [id number]  Route: Intravitreal, Site: Left Eye, Waste: 0 mg  Post-op Post injection exam found visual acuity of at least counting fingers. The patient tolerated the procedure well. There were no complications. The patient received written and verbal post procedure care education. Post injection medications were not given.                 ASSESSMENT/PLAN:  No problem-specific Assessment & Plan notes found for this encounter.      ICD-10-CM   1. Exudative age-related macular degeneration of left eye with active choroidal neovascularization (HCC)  H35.3221 OCT, Retina - OU - Both Eyes    Intravitreal Injection, Pharmacologic Agent - OS - Left Eye    aflibercept (EYLEA) SOLN 2 mg  2. Intermediate stage nonexudative age-related macular degeneration of right eye  H35.3112 OCT, Retina - OU - Both Eyes  3. 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  Large submacular drusenoid pigment epithelial detachments present since November 2020 have dramatically improved and nearly resolved as compared to last examination January 08, 2020 in the right eye.  No still no signs of CNVM  OS, large pigment epithelial detachment Seen on last examination have completely resolved after institution of intravitreal Eylea OS.  This contralateral effect on OD of the intravitreal injection of Eylea OS, is quite interesting and suggest beneficial effect from Eylea OD  2.  Minor diplopia compliance with patient particular with reading out and some fatigue.  With the improved macular appearance anatomically by examination and OCT,  recommend follow-up with Dr. Ellie Lunch for consideration of refraction 3.  Ophthalmic Meds Ordered this visit:  Meds ordered this encounter  Medications  . aflibercept (EYLEA) SOLN 2 mg       Return in about 5 weeks (around 04/21/2020) for dilate, OS, EYLEA OCT.  There are no Patient Instructions on file for this visit.   Explained the diagnoses, plan, and follow up with the patient and they expressed understanding.  Patient expressed understanding of the importance of proper follow up care.   Clent Demark Lydell Moga M.D. Diseases & Surgery of the Retina and Vitreous Retina & Diabetic [place] 03/17/20     Abbreviations: M myopia (nearsighted); A astigmatism; H hyperopia (farsighted); P presbyopia; Mrx spectacle prescription;  CTL contact lenses; OD right eye; OS left eye; OU both eyes  XT exotropia; ET esotropia; PEK punctate epithelial keratitis; PEE punctate epithelial erosions; DES dry eye syndrome; MGD meibomian gland dysfunction; ATs artificial tears; PFAT's preservative free artificial tears; [place] nuclear sclerotic cataract; PSC posterior subcapsular cataract; ERM epi-retinal membrane; PVD posterior vitreous detachment; RD retinal detachment; DM diabetes mellitus; DR diabetic retinopathy; NPDR non-proliferative diabetic retinopathy; PDR proliferative diabetic retinopathy; CSME clinically significant macular edema; DME diabetic macular edema; dbh dot blot hemorrhages; CWS cotton wool spot; POAG primary open angle glaucoma; C/D cup-to-disc ratio; HVF humphrey visual field; GVF goldmann visual field; OCT optical coherence tomography; IOP intraocular pressure; BRVO Branch retinal vein occlusion; CRVO central retinal vein occlusion; CRAO central retinal artery occlusion; BRAO branch retinal artery occlusion; RT retinal tear; SB scleral buckle; PPV pars plana vitrectomy; VH Vitreous hemorrhage; PRP panretinal laser photocoagulation; IVK intravitreal kenalog; VMT vitreomacular traction; MH Macular  hole;  NVD neovascularization of the disc; NVE neovascularization elsewhere; AREDS age related eye disease study; ARMD age related macular degeneration; POAG primary open angle glaucoma; EBMD epithelial/anterior  basement membrane dystrophy; ACIOL anterior chamber intraocular lens; IOL intraocular lens; PCIOL posterior chamber intraocular lens; Phaco/IOL phacoemulsification with intraocular lens placement; [place] photorefractive keratectomy; LASIK laser assisted in situ keratomileusis; HTN hypertension; DM diabetes mellitus; COPD chronic obstructive pulmonary disease

## 2020-03-18 ENCOUNTER — Telehealth: Payer: Self-pay | Admitting: Addiction (Substance Use Disorder)

## 2020-03-18 NOTE — Telephone Encounter (Signed)
Pt called to cancel her appt for May 19 and May 26 due to family coming in to town. She then said to cancel all of her appts with [facility] and she would send a note in re: why. She just wanted to cancel all the appts she had.

## 2020-03-19 ENCOUNTER — Ambulatory Visit: Payer: PPO | Admitting: Addiction (Substance Use Disorder)

## 2020-03-19 DIAGNOSIS — M9903 Segmental and somatic dysfunction of lumbar region: Secondary | ICD-10-CM | POA: Diagnosis not present

## 2020-03-19 DIAGNOSIS — M5030 Other cervical disc degeneration, unspecified cervical region: Secondary | ICD-10-CM | POA: Diagnosis not present

## 2020-03-19 DIAGNOSIS — M9904 Segmental and somatic dysfunction of sacral region: Secondary | ICD-10-CM | POA: Diagnosis not present

## 2020-03-19 DIAGNOSIS — M9901 Segmental and somatic dysfunction of cervical region: Secondary | ICD-10-CM | POA: Diagnosis not present

## 2020-03-19 DIAGNOSIS — M5136 Other intervertebral disc degeneration, lumbar region: Secondary | ICD-10-CM | POA: Diagnosis not present

## 2020-03-19 DIAGNOSIS — M9902 Segmental and somatic dysfunction of thoracic region: Secondary | ICD-10-CM | POA: Diagnosis not present

## 2020-03-23 DIAGNOSIS — R5383 Other fatigue: Secondary | ICD-10-CM | POA: Diagnosis not present

## 2020-03-24 DIAGNOSIS — M9902 Segmental and somatic dysfunction of thoracic region: Secondary | ICD-10-CM | POA: Diagnosis not present

## 2020-03-24 DIAGNOSIS — M9901 Segmental and somatic dysfunction of cervical region: Secondary | ICD-10-CM | POA: Diagnosis not present

## 2020-03-24 DIAGNOSIS — M9903 Segmental and somatic dysfunction of lumbar region: Secondary | ICD-10-CM | POA: Diagnosis not present

## 2020-03-24 DIAGNOSIS — M5030 Other cervical disc degeneration, unspecified cervical region: Secondary | ICD-10-CM | POA: Diagnosis not present

## 2020-03-24 DIAGNOSIS — M5136 Other intervertebral disc degeneration, lumbar region: Secondary | ICD-10-CM | POA: Diagnosis not present

## 2020-03-24 DIAGNOSIS — M9904 Segmental and somatic dysfunction of sacral region: Secondary | ICD-10-CM | POA: Diagnosis not present

## 2020-03-26 ENCOUNTER — Ambulatory Visit: Payer: PPO | Admitting: Addiction (Substance Use Disorder)

## 2020-04-03 ENCOUNTER — Ambulatory Visit: Payer: PPO | Admitting: Addiction (Substance Use Disorder)

## 2020-04-10 ENCOUNTER — Ambulatory Visit: Payer: PPO | Admitting: Addiction (Substance Use Disorder)

## 2020-04-17 ENCOUNTER — Ambulatory Visit: Payer: PPO | Admitting: Addiction (Substance Use Disorder)

## 2020-04-23 ENCOUNTER — Other Ambulatory Visit: Payer: Self-pay

## 2020-04-23 ENCOUNTER — Encounter (INDEPENDENT_AMBULATORY_CARE_PROVIDER_SITE_OTHER): Payer: Self-pay | Admitting: Ophthalmology

## 2020-04-23 ENCOUNTER — Ambulatory Visit (INDEPENDENT_AMBULATORY_CARE_PROVIDER_SITE_OTHER): Payer: PPO | Admitting: Ophthalmology

## 2020-04-23 DIAGNOSIS — H353221 Exudative age-related macular degeneration, left eye, with active choroidal neovascularization: Secondary | ICD-10-CM

## 2020-04-23 MED ORDER — AFLIBERCEPT 2MG/0.05ML IZ SOLN FOR KALEIDOSCOPE
2.0000 mg | INTRAVITREAL | Status: AC | PRN
  Administered 2020-04-23: 2 mg via INTRAVITREAL

## 2020-04-23 NOTE — Assessment & Plan Note (Signed)
Less subretinal fluid and disease activity, wet AMD, on Eylea currently at 5-week exam interval.  Repeat in intravitreal Eylea today and exam in 6 weeks

## 2020-04-23 NOTE — Progress Notes (Signed)
04/23/2020     CHIEF COMPLAINT Patient presents for Retina Follow Up   HISTORY OF PRESENT ILLNESS: Angie Liu is a 74 y.o. female who presents to the clinic today for:   HPI    Retina Follow Up    Patient presents with  Wet AMD.  In left eye.  This started 5 weeks ago.  Severity is mild.  Duration of 5 weeks.  Since onset it is stable.          Comments    5 Week AMD F/U OS, poss Eylea OS  Pt denies noticeable changes to [place] OU since last visit. Pt denies ocular pain, flashes of light, or floaters OU.         Last edited by Rockie Neighbours, [place] on 04/23/2020 10:18 AM. (History)      Referring physician: Lanice Shirts, MD [address] [place],  [place] [address]  HISTORICAL INFORMATION:   Selected notes from the [facility]: No current outpatient medications on file. (Ophthalmic Drugs)   No current facility-administered medications for this visit. (Ophthalmic Drugs)   Current Outpatient Medications (Other)  Medication Sig  . ALPRAZolam (XANAX) 0.5 MG tablet Take 1 tab po QHS and 1 tab po qd prn anxiety  . chlorpheniramine (CHLOR-TRIMETON) 4 MG tablet Take 4 mg by mouth 2 (two) times daily as needed for allergies.   . Cholecalciferol (VITAMIN D3) 1000 units CAPS Take by mouth.  . doxepin (SINEQUAN) 75 MG capsule Take 1 capsule (75 mg total) by mouth at bedtime.  . fluocinonide (LIDEX) 0.05 % external solution fluocinonide 0.05 % topical solution  . FOLIC ACID PO Take by mouth.  . melatonin 3 MG TABS tablet Take 3 mg by mouth at bedtime.  . Multiple Vitamins-Minerals (PRESERVISION AREDS PO) Take 1 tablet by mouth 2 (two) times daily.  . mupirocin ointment (BACTROBAN) 2 % mupirocin 2 % topical ointment  . OVER THE COUNTER MEDICATION 4 capsules daily. Calm CP, takes 1 breakfast, 1 at dinner and 2 at bedtime  . OVER THE COUNTER MEDICATION 1 capsule at bedtime. Kavinace capusle  . OVER THE COUNTER MEDICATION Place  1 tablet under the tongue every morning. [place]  . OVER THE COUNTER MEDICATION Place 1 tablet under the tongue. Butyra  . OVER THE COUNTER MEDICATION Alpha gabba  . Progesterone Micronized (PROGESTERONE PO) Take 2 capsules by mouth at bedtime. 1:1 SR compound medication at [facility].  . THEANINE PO Take by mouth.  Marland Kitchen UNABLE TO FIND every 30 (thirty) days. Formula A16B  . UNABLE TO FIND Sarspa  . zolpidem (AMBIEN CR) 12.5 MG CR tablet Take 1 tablet (12.5 mg total) by mouth at bedtime.   No current facility-administered medications for this visit. (Other)      REVIEW OF SYSTEMS:    ALLERGIES Allergies  Allergen Reactions  . Latex Rash    PAST MEDICAL HISTORY Past Medical History:  Diagnosis Date  . Anxiety   . Depression   . Hayfever    Past Surgical History:  Procedure Laterality Date  . CATARACT EXTRACTION W/PHACO Bilateral 2018  . TONSILLECTOMY      FAMILY HISTORY Family History  Problem Relation Age of Onset  . Cancer Mother        colon  . Depression Mother   . Diabetes Father   . Depression Father   . Depression Son   . Depression Paternal Uncle  SOCIAL HISTORY Social History   Tobacco Use  . Smoking status: Never Smoker  . Smokeless tobacco: Never Used  Substance Use Topics  . Alcohol use: Yes  . Drug use: No         OPHTHALMIC EXAM:  Base Eye Exam    Visual Acuity (ETDRS)      Right Left   Dist cc 20/25 -1 20/20 -2   Correction: Glasses       Tonometry (Tonopen, 10:21 AM)      Right Left   Pressure 08 08       Pupils      Pupils Dark Light Shape React APD   Right PERRL 4 3 Round Brisk None   Left PERRL 4 3 Round Brisk None       Visual Fields (Counting fingers)      Left Right    Full Full       Extraocular Movement      Right Left    Full Full       Neuro/Psych    Oriented x3: Yes   Mood/Affect: Normal       Dilation    Left eye: 1.0% Mydriacyl, 2.5% Phenylephrine @ 10:21 AM        Slit Lamp and Fundus  Exam    Slit Lamp Exam      Right Left   Lids/Lashes Normal Normal   Conjunctiva/Sclera White and quiet White and quiet   Cornea Clear Clear   Anterior Chamber Deep and quiet Deep and quiet   Iris Round and reactive Round and reactive   Lens Posterior chamber intraocular lens Posterior chamber intraocular lens   Anterior Vitreous Normal Normal       Fundus Exam      Right Left   Posterior Vitreous Normal Posterior vitreous detachment   Disc  Normal   C/D Ratio  0.6   Macula  Atrophy, Retinal pigment epithelial detachment, Drusen   Vessels  Normal   Periphery  Normal          IMAGING AND PROCEDURES  Imaging and Procedures for 04/23/20  OCT, Retina - OU - Both Eyes       Right Eye Quality was good. Scan locations included subfoveal. Central Foveal Thickness: 245. Progression has been stable. Findings include normal observations, retinal drusen , pigment epithelial detachment.   Left Eye Quality was good. Scan locations included subfoveal. Central Foveal Thickness: 249. Progression has improved. Findings include subretinal hyper-reflective material.   Notes OS with much less subretinal fluid as compared to onset of CNVM disease, November 2020.  OD with large subfoveal drusen or in the pigment epithelial detachments, are now smaller as compared to November 2020       Intravitreal Injection, Pharmacologic Agent - OS - Left Eye       Time Out 04/23/2020. 11:32 AM. Confirmed correct patient, procedure, site, and patient consented.   Anesthesia Topical anesthesia was used. Anesthetic medications included Akten 3.5%.   Procedure Preparation included Ofloxacin , 10% betadine to eyelids. A 30 gauge needle was used.   Injection:  2 mg aflibercept Alfonse Flavors) SOLN   NDC: [id number], Lot: [id number]   Route: Intravitreal, Site: Left Eye, Waste: 0 mg  Post-op Post injection exam found visual acuity of at least counting fingers. The patient tolerated the procedure well.  There were no complications. The patient received written and verbal post procedure care education. Post injection medications were not given.  ASSESSMENT/PLAN:  Exudative age-related macular degeneration of left eye with active choroidal neovascularization (HCC) Less subretinal fluid and disease activity, wet AMD, on Eylea currently at 5-week exam interval.  Repeat in intravitreal Eylea today and exam in 6 weeks      ICD-10-CM   1. Exudative age-related macular degeneration of left eye with active choroidal neovascularization (HCC)  H35.3221 OCT, Retina - OU - Both Eyes    Intravitreal Injection, Pharmacologic Agent - OS - Left Eye    aflibercept (EYLEA) SOLN 2 mg    1.  2.  3.  Ophthalmic Meds Ordered this visit:  Meds ordered this encounter  Medications  . aflibercept (EYLEA) SOLN 2 mg       Return in about 6 weeks (around 06/04/2020) for EYLEA OCT, OS.  There are no Patient Instructions on file for this visit.   Explained the diagnoses, plan, and follow up with the patient and they expressed understanding.  Patient expressed understanding of the importance of proper follow up care.   Clent Demark Idrees Quam M.D. Diseases & Surgery of the Retina and Vitreous Retina & Diabetic [place] 04/23/20     Abbreviations: M myopia (nearsighted); A astigmatism; H hyperopia (farsighted); P presbyopia; Mrx spectacle prescription;  CTL contact lenses; OD right eye; OS left eye; OU both eyes  XT exotropia; ET esotropia; PEK punctate epithelial keratitis; PEE punctate epithelial erosions; DES dry eye syndrome; MGD meibomian gland dysfunction; ATs artificial tears; PFAT's preservative free artificial tears; [place] nuclear sclerotic cataract; PSC posterior subcapsular cataract; ERM epi-retinal membrane; PVD posterior vitreous detachment; RD retinal detachment; DM diabetes mellitus; DR diabetic retinopathy; NPDR non-proliferative diabetic retinopathy; PDR proliferative diabetic  retinopathy; CSME clinically significant macular edema; DME diabetic macular edema; dbh dot blot hemorrhages; CWS cotton wool spot; POAG primary open angle glaucoma; C/D cup-to-disc ratio; HVF humphrey visual field; GVF goldmann visual field; OCT optical coherence tomography; IOP intraocular pressure; BRVO Branch retinal vein occlusion; CRVO central retinal vein occlusion; CRAO central retinal artery occlusion; BRAO branch retinal artery occlusion; RT retinal tear; SB scleral buckle; PPV pars plana vitrectomy; VH Vitreous hemorrhage; PRP panretinal laser photocoagulation; IVK intravitreal kenalog; VMT vitreomacular traction; MH Macular hole;  NVD neovascularization of the disc; NVE neovascularization elsewhere; AREDS age related eye disease study; ARMD age related macular degeneration; POAG primary open angle glaucoma; EBMD epithelial/anterior basement membrane dystrophy; ACIOL anterior chamber intraocular lens; IOL intraocular lens; PCIOL posterior chamber intraocular lens; Phaco/IOL phacoemulsification with intraocular lens placement; [place] photorefractive keratectomy; LASIK laser assisted in situ keratomileusis; HTN hypertension; DM diabetes mellitus; COPD chronic obstructive pulmonary disease

## 2020-04-24 ENCOUNTER — Ambulatory Visit: Payer: PPO | Admitting: Addiction (Substance Use Disorder)

## 2020-04-30 ENCOUNTER — Ambulatory Visit: Payer: PPO | Admitting: Addiction (Substance Use Disorder)

## 2020-05-06 ENCOUNTER — Other Ambulatory Visit: Payer: Self-pay | Admitting: Psychiatry

## 2020-05-06 DIAGNOSIS — F5101 Primary insomnia: Secondary | ICD-10-CM

## 2020-05-06 DIAGNOSIS — F3342 Major depressive disorder, recurrent, in full remission: Secondary | ICD-10-CM

## 2020-05-16 DIAGNOSIS — M9904 Segmental and somatic dysfunction of sacral region: Secondary | ICD-10-CM | POA: Diagnosis not present

## 2020-05-16 DIAGNOSIS — M9903 Segmental and somatic dysfunction of lumbar region: Secondary | ICD-10-CM | POA: Diagnosis not present

## 2020-05-16 DIAGNOSIS — M9902 Segmental and somatic dysfunction of thoracic region: Secondary | ICD-10-CM | POA: Diagnosis not present

## 2020-05-16 DIAGNOSIS — M5136 Other intervertebral disc degeneration, lumbar region: Secondary | ICD-10-CM | POA: Diagnosis not present

## 2020-05-16 DIAGNOSIS — M9901 Segmental and somatic dysfunction of cervical region: Secondary | ICD-10-CM | POA: Diagnosis not present

## 2020-05-16 DIAGNOSIS — M5030 Other cervical disc degeneration, unspecified cervical region: Secondary | ICD-10-CM | POA: Diagnosis not present

## 2020-05-21 DIAGNOSIS — M9904 Segmental and somatic dysfunction of sacral region: Secondary | ICD-10-CM | POA: Diagnosis not present

## 2020-05-21 DIAGNOSIS — M9903 Segmental and somatic dysfunction of lumbar region: Secondary | ICD-10-CM | POA: Diagnosis not present

## 2020-05-21 DIAGNOSIS — M9902 Segmental and somatic dysfunction of thoracic region: Secondary | ICD-10-CM | POA: Diagnosis not present

## 2020-05-21 DIAGNOSIS — M9901 Segmental and somatic dysfunction of cervical region: Secondary | ICD-10-CM | POA: Diagnosis not present

## 2020-05-21 DIAGNOSIS — M5136 Other intervertebral disc degeneration, lumbar region: Secondary | ICD-10-CM | POA: Diagnosis not present

## 2020-05-21 DIAGNOSIS — M5030 Other cervical disc degeneration, unspecified cervical region: Secondary | ICD-10-CM | POA: Diagnosis not present

## 2020-05-28 DIAGNOSIS — M9903 Segmental and somatic dysfunction of lumbar region: Secondary | ICD-10-CM | POA: Diagnosis not present

## 2020-05-28 DIAGNOSIS — M9904 Segmental and somatic dysfunction of sacral region: Secondary | ICD-10-CM | POA: Diagnosis not present

## 2020-05-28 DIAGNOSIS — M9901 Segmental and somatic dysfunction of cervical region: Secondary | ICD-10-CM | POA: Diagnosis not present

## 2020-05-28 DIAGNOSIS — M5136 Other intervertebral disc degeneration, lumbar region: Secondary | ICD-10-CM | POA: Diagnosis not present

## 2020-05-28 DIAGNOSIS — M5030 Other cervical disc degeneration, unspecified cervical region: Secondary | ICD-10-CM | POA: Diagnosis not present

## 2020-05-28 DIAGNOSIS — M9902 Segmental and somatic dysfunction of thoracic region: Secondary | ICD-10-CM | POA: Diagnosis not present

## 2020-06-03 ENCOUNTER — Ambulatory Visit (INDEPENDENT_AMBULATORY_CARE_PROVIDER_SITE_OTHER): Payer: PPO | Admitting: Psychiatry

## 2020-06-03 ENCOUNTER — Other Ambulatory Visit: Payer: Self-pay

## 2020-06-03 ENCOUNTER — Encounter: Payer: Self-pay | Admitting: Psychiatry

## 2020-06-03 DIAGNOSIS — F3342 Major depressive disorder, recurrent, in full remission: Secondary | ICD-10-CM

## 2020-06-03 DIAGNOSIS — F5101 Primary insomnia: Secondary | ICD-10-CM | POA: Diagnosis not present

## 2020-06-03 MED ORDER — DOXEPIN HCL 75 MG PO CAPS: 75.0000 mg | ORAL_CAPSULE | Freq: Every day | ORAL | 1 refills | Status: DC

## 2020-06-03 NOTE — Progress Notes (Signed)
Angie Liu [id number] [birth year] 74 y.o.  Subjective:   Patient ID:  Angie Liu is a 74 y.o. (DOB [birth year]) female.  Chief Complaint: No chief complaint on file.   HPI Angie Liu presents to the office today for follow-up of insomnia and h/o anxiety and depression. She reorts that she initially had some middle of the night awakening for 7-10 days and then this resolved and she is now sleeping through the night.   She reports that her mood has been lower at times, however she prefers not to add medication at this time. She enjoys music and will listen to different performers on You Tube and this lifts her mood. She also enjoys reading.  Reports that she is holding back on going to the gym to minimize exposure to Delta variant. She reports occasional irritability. She reports that she has started weight watchers and would like to lose weight. She reports that weight gain may have been related to decreased activity and continuing to eat the same way she had when she was more active. Appetite has been good. Looking forward to making some home repairs. She reports that her energy is lower than it could be. Reports that she used to enjoy cooking and has had less motivation for cooking. Motivation has been lower for other activities, such as exercise. She reports that she has anxiety with driving places that are further away. She reports that she took Xanax prn prior to driving to a funeral about 45 minutes away. She reports that her concentration has been ok, particularly for things that she is interested in. Denies SI.   PHQ2-9     Office Visit from 03/06/2015 in Primary Care at [facility] Total Score 0       Review of Systems:  Review of Systems  HENT: Positive for tinnitus.   Eyes:       Reports that she has been receiving eye injections with positive response  Cardiovascular:       She reports improved LE edema with machine to improve blood flow.  Musculoskeletal: Negative for  gait problem.  Psychiatric/Behavioral:       Please refer to HPI    Medications: I have reviewed the patient's current medications.  Current Outpatient Medications  Medication Sig Dispense Refill  . ALPRAZolam (XANAX) 0.5 MG tablet Take 1 tab po QHS and 1 tab po qd prn anxiety 180 tablet 1  . Cholecalciferol (VITAMIN D3) 1000 units CAPS Take by mouth.    . FOLIC ACID PO Take by mouth.    . melatonin 3 MG TABS tablet Take 3 mg by mouth at bedtime.    Marland Kitchen OVER THE COUNTER MEDICATION 4 capsules daily. Calm CP, takes 1 breakfast, 1 at dinner and 2 at bedtime    . OVER THE COUNTER MEDICATION 1 capsule at bedtime. Kavinace capusle    . OVER THE COUNTER MEDICATION Place 1 tablet under the tongue every morning. Seneca    . OVER THE COUNTER MEDICATION Place 1 tablet under the tongue. Butyra    . OVER THE COUNTER MEDICATION Alpha gabba    . Progesterone Micronized (PROGESTERONE PO) Take 2 capsules by mouth at bedtime. 1:1 SR compound medication at [facility].    . THEANINE PO Take by mouth.    Marland Kitchen UNABLE TO FIND every 30 (thirty) days. Formula A16B    . UNABLE TO FIND Sarspa    . zolpidem (AMBIEN CR) 12.5 MG CR tablet Take 1 tablet (12.5  mg total) by mouth at bedtime. 90 tablet 1  . chlorpheniramine (CHLOR-TRIMETON) 4 MG tablet Take 4 mg by mouth 2 (two) times daily as needed for allergies.     Marland Kitchen doxepin (SINEQUAN) 75 MG capsule Take 1 capsule (75 mg total) by mouth at bedtime. 90 capsule 1  . fluocinonide (LIDEX) 0.05 % external solution fluocinonide 0.05 % topical solution    . Multiple Vitamins-Minerals (PRESERVISION AREDS PO) Take 1 tablet by mouth 2 (two) times daily.    . mupirocin ointment (BACTROBAN) 2 % mupirocin 2 % topical ointment     No current facility-administered medications for this visit.    Medication Side Effects: None  Allergies:  Allergies  Allergen Reactions  . Latex Rash    Past Medical History:  Diagnosis Date  . Anxiety   . Depression   . Hayfever     Family  History  Problem Relation Age of Onset  . Cancer Mother        colon  . Depression Mother   . Diabetes Father   . Depression Father   . Depression Son   . Depression Paternal Uncle     Social History   Socioeconomic History  . Marital status: Married    Spouse name: Not on file  . Number of children: Not on file  . Years of education: Not on file  . Highest education level: Not on file  Occupational History  . Not on file  Tobacco Use  . Smoking status: Never Smoker  . Smokeless tobacco: Never Used  Substance and Sexual Activity  . Alcohol use: Yes  . Drug use: No  . Sexual activity: Not on file  Other Topics Concern  . Not on file  Social History Narrative  . Not on file   Social Determinants of Health   Financial Resource Strain:   . Difficulty of Paying Living Expenses:   Food Insecurity:   . Worried About Charity fundraiser in the Last Year:   . Arboriculturist in the Last Year:   Transportation Needs:   . Film/video editor (Medical):   Marland Kitchen Lack of Transportation (Non-Medical):   Physical Activity:   . Days of Exercise per Week:   . Minutes of Exercise per Session:   Stress:   . Feeling of Stress :   Social Connections:   . Frequency of Communication with Friends and Family:   . Frequency of Social Gatherings with Friends and Family:   . Attends Religious Services:   . Active Member of Clubs or Organizations:   . Attends Archivist Meetings:   Marland Kitchen Marital Status:   Intimate Partner Violence:   . Fear of Current or Ex-Partner:   . Emotionally Abused:   Marland Kitchen Physically Abused:   . Sexually Abused:     Past Medical History, Surgical history, Social history, and Family history were reviewed and updated as appropriate.   Please see review of systems for further details on the patient's review from today.   Objective:   Physical Exam:  Wt 161 lb (73 kg)   BMI 25.99 kg/m   Physical Exam Constitutional:      General: She is not in acute  distress. Musculoskeletal:        General: No deformity.  Neurological:     Mental Status: She is alert and oriented to person, place, and time.     Coordination: Coordination normal.  Psychiatric:        Attention  and Perception: Attention and perception normal. She does not perceive auditory or visual hallucinations.        Mood and Affect: Mood is not anxious. Affect is not labile, blunt, angry or inappropriate.        Speech: Speech normal.        Behavior: Behavior normal.        Thought Content: Thought content normal. Thought content is not paranoid or delusional. Thought content does not include homicidal or suicidal ideation. Thought content does not include homicidal or suicidal plan.        Cognition and Memory: Cognition and memory normal.        Judgment: Judgment normal.     Comments: Insight intact Mood presents as dysthymic      Lab Review:  No results found for: NA, K, CL, CO2, GLUCOSE, BUN, CREATININE, CALCIUM, PROT, ALBUMIN, AST, ALT, ALKPHOS, BILITOT, GFRNONAA, GFRAA  No results found for: WBC, RBC, HGB, HCT, PLT, MCV, MCH, MCHC, RDW, LYMPHSABS, MONOABS, EOSABS, BASOSABS  No results found for: POCLITH, LITHIUM   No results found for: PHENYTOIN, PHENOBARB, VALPROATE, CBMZ   .res Assessment: Plan:   Patient reports that she would prefer to continue current plan of care at this time although she is noticing some increase in depressive signs and symptoms since decreasing doxepin from 100 mg at bedtime to 75 mg at bedtime. Continue alprazolam 0.5 mg at bedtime and 1 tablet daily as needed for anxiety.  Discussed that patient has a refill remaining on file and advised her to contact office or pharmacy if she needs a refill after refill on file has expired. Continue Ambien CR 12.5 mg at bedtime for insomnia.  Discussed that patient also has a refill remaining for Ambien CR. Patient to follow-up in 3 months or sooner if clinically indicated. Patient advised to contact  office with any questions, adverse effects, or acute worsening in signs and symptoms.  Diagnoses and all orders for this visit:  Primary insomnia -     doxepin (SINEQUAN) 75 MG capsule; Take 1 capsule (75 mg total) by mouth at bedtime.  Major depressive disorder, recurrent episode, in full remission ([employer]) -     doxepin (SINEQUAN) 75 MG capsule; Take 1 capsule (75 mg total) by mouth at bedtime.     Please see After Visit Summary for patient specific instructions.  Future Appointments  Date Time Provider [place]  06/04/2020 10:45 AM Rankin, Clent Demark, MD RDE-RDE None  09/03/2020 12:45 PM Thayer Headings, PMHNP CP-CP None    No orders of the defined types were placed in this encounter.   -------------------------------

## 2020-06-04 ENCOUNTER — Ambulatory Visit (INDEPENDENT_AMBULATORY_CARE_PROVIDER_SITE_OTHER): Payer: PPO | Admitting: Ophthalmology

## 2020-06-04 ENCOUNTER — Encounter (INDEPENDENT_AMBULATORY_CARE_PROVIDER_SITE_OTHER): Payer: Self-pay | Admitting: Ophthalmology

## 2020-06-04 ENCOUNTER — Other Ambulatory Visit: Payer: Self-pay

## 2020-06-04 DIAGNOSIS — H353221 Exudative age-related macular degeneration, left eye, with active choroidal neovascularization: Secondary | ICD-10-CM | POA: Diagnosis not present

## 2020-06-04 DIAGNOSIS — H353211 Exudative age-related macular degeneration, right eye, with active choroidal neovascularization: Secondary | ICD-10-CM | POA: Insufficient documentation

## 2020-06-04 DIAGNOSIS — H35721 Serous detachment of retinal pigment epithelium, right eye: Secondary | ICD-10-CM | POA: Diagnosis not present

## 2020-06-04 MED ORDER — AFLIBERCEPT 2MG/0.05ML IZ SOLN FOR KALEIDOSCOPE
2.0000 mg | INTRAVITREAL | Status: AC | PRN
  Administered 2020-06-04: 2 mg via INTRAVITREAL

## 2020-06-04 NOTE — Assessment & Plan Note (Signed)
New and progressive and associated with wet AMD

## 2020-06-04 NOTE — Progress Notes (Signed)
06/04/2020     CHIEF COMPLAINT Patient presents for Retina Follow Up   HISTORY OF PRESENT ILLNESS: Angie Liu is a 74 y.o. female who presents to the clinic today for:   HPI    Retina Follow Up    Patient presents with  Wet AMD.  In left eye.  This started 6 weeks ago.  Severity is mild.  Duration of 6 weeks.  Since onset it is gradually improving.          Comments    6 Week AMD F/U OS, poss Eylea OS  Pt reports slightly improved VA OS since last visit. Pt c/o difficulty adjusting with progressive lenses. OD stable.        Last edited by Rockie Neighbours, [place] on 06/04/2020 11:10 AM. (History)      Referring physician: Hayden Rasmussen, MD [place] [id number]  HISTORICAL INFORMATION:   Selected notes from the MEDICAL RECORD NUMBER       CURRENT MEDICATIONS: No current outpatient medications on file. (Ophthalmic Drugs)   No current facility-administered medications for this visit. (Ophthalmic Drugs)   Current Outpatient Medications (Other)  Medication Sig  . ALPRAZolam (XANAX) 0.5 MG tablet Take 1 tab po QHS and 1 tab po qd prn anxiety  . chlorpheniramine (CHLOR-TRIMETON) 4 MG tablet Take 4 mg by mouth 2 (two) times daily as needed for allergies.   . Cholecalciferol (VITAMIN D3) 1000 units CAPS Take by mouth.  . doxepin (SINEQUAN) 75 MG capsule Take 1 capsule (75 mg total) by mouth at bedtime.  . fluocinonide (LIDEX) 0.05 % external solution fluocinonide 0.05 % topical solution  . FOLIC ACID PO Take by mouth.  . melatonin 3 MG TABS tablet Take 3 mg by mouth at bedtime.  . Multiple Vitamins-Minerals (PRESERVISION AREDS PO) Take 1 tablet by mouth 2 (two) times daily.  . mupirocin ointment (BACTROBAN) 2 % mupirocin 2 % topical ointment  . OVER THE COUNTER MEDICATION 4 capsules daily. Calm CP, takes 1 breakfast, 1 at dinner and 2 at bedtime  . OVER THE COUNTER MEDICATION 1 capsule at bedtime. Kavinace capusle  . OVER THE COUNTER  MEDICATION Place 1 tablet under the tongue every morning. Seneca  . OVER THE COUNTER MEDICATION Place 1 tablet under the tongue. Butyra  . OVER THE COUNTER MEDICATION Alpha gabba  . Progesterone Micronized (PROGESTERONE PO) Take 2 capsules by mouth at bedtime. 1:1 SR compound medication at [facility].  . THEANINE PO Take by mouth.  Marland Kitchen UNABLE TO FIND every 30 (thirty) days. Formula A16B  . UNABLE TO FIND Sarspa  . zolpidem (AMBIEN CR) 12.5 MG CR tablet Take 1 tablet (12.5 mg total) by mouth at bedtime.   No current facility-administered medications for this visit. (Other)      REVIEW OF SYSTEMS:    ALLERGIES Allergies  Allergen Reactions  . Latex Rash    PAST MEDICAL HISTORY Past Medical History:  Diagnosis Date  . Anxiety   . Depression   . Hayfever    Past Surgical History:  Procedure Laterality Date  . CATARACT EXTRACTION W/PHACO Bilateral 2018  . TONSILLECTOMY      FAMILY HISTORY Family History  Problem Relation Age of Onset  . Cancer Mother        colon  . Depression Mother   . Diabetes Father   . Depression Father   . Depression Son   . Depression Paternal Uncle  SOCIAL HISTORY Social History   Tobacco Use  . Smoking status: Never Smoker  . Smokeless tobacco: Never Used  Substance Use Topics  . Alcohol use: Yes  . Drug use: No         OPHTHALMIC EXAM:  Base Eye Exam    Visual Acuity (ETDRS)      Right Left   Dist cc 20/25 +2 20/20 -1   Correction: Glasses       Tonometry (Tonopen, 11:13 AM)      Right Left   Pressure 13 11       Pupils      Pupils Dark Light Shape React APD   Right PERRL 4 3 Round Brisk None   Left PERRL 4 3 Round Brisk None       Visual Fields (Counting fingers)      Left Right    Full Full       Extraocular Movement      Right Left    Full Full       Neuro/Psych    Oriented x3: Yes   Mood/Affect: Normal       Dilation    Left eye: 1.0% Mydriacyl, 2.5% Phenylephrine @ 11:13 AM        Slit  Lamp and Fundus Exam    Slit Lamp Exam      Right Left   Lids/Lashes Normal Normal   Conjunctiva/Sclera White and quiet White and quiet   Cornea Clear Clear   Anterior Chamber Deep and quiet Deep and quiet   Iris Round and reactive Round and reactive   Lens Posterior chamber intraocular lens Posterior chamber intraocular lens   Anterior Vitreous Normal Normal       Fundus Exam      Right Left   Posterior Vitreous Normal Posterior vitreous detachment   Disc  Normal   C/D Ratio  0.6   Macula  Atrophy, Retinal pigment epithelial detachment, Drusen, no exudates, no hemorrhage, no macular thickening   Vessels  Normal   Periphery  Normal          IMAGING AND PROCEDURES  Imaging and Procedures for 06/04/20  OCT, Retina - OU - Both Eyes       Right Eye Quality was good. Scan locations included subfoveal. Central Foveal Thickness: 248. Progression has worsened. Findings include subretinal fluid, pigment epithelial detachment.   Left Eye Quality was good. Scan locations included subfoveal. Central Foveal Thickness: 254. Progression has improved. Findings include retinal drusen , no SRF, no IRF.   Notes OS, has improved in anatomy and functional vastly since onset of disease and therapy November 2020.  Much less in fact resolved subretinal fluid and subfoveal pigment epithelial detachment is also resolved.  Currently on 6-week exam interval and injection.  We will repeat injection Eylea OS today and examination repeated in 8 weeks  OD, the findings were reviewed with the patient in serial fashion over the last for 5 months during which time drusenoid pigment epithelial detachments improved in size and extent to a point in March 2021.  Now new subfoveal multilobular pigment epithelial detachments have developed in the subfoveal location OD.  The same types of pigment epithelial detachments that developed CNVM formation of the left eye.  Thus I have suggested and will recommend injection  of Eylea in the right eye to commence next week       Intravitreal Injection, Pharmacologic Agent - OS - Left Eye       Time Out  06/04/2020. 11:50 AM. Confirmed correct patient, procedure, site, and patient consented.   Anesthesia Topical anesthesia was used. Anesthetic medications included Akten 3.5%.   Procedure Preparation included 10% betadine to eyelids, 5% betadine to ocular surface. A 30 gauge needle was used.   Injection:  2 mg aflibercept Alfonse Flavors) SOLN   NDC: [id number], Lot: [id number]   Route: Intravitreal, Site: Left Eye, Waste: 0 mg  Post-op Post injection exam found visual acuity of at least counting fingers. The patient tolerated the procedure well. There were no complications. The patient received written and verbal post procedure care education. Post injection medications were not given.                 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 Exudative age-related macular degeneration of left eye with active choroidal neovascularization (HCC)  H35.3221 OCT, Retina - OU - Both Eyes    Intravitreal Injection, Pharmacologic Agent - OS - Left Eye    aflibercept (EYLEA) SOLN 2 mg  2. 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 Exudative age-related macular degeneration of right eye with active choroidal neovascularization ([place])  H35.3211     1.OS, has improved in anatomy and functional vastly since onset of disease and therapy November 2020.  Much less in fact resolved subretinal fluid and subfoveal pigment epithelial detachment is also resolved.  Currently on 6-week exam interval and injection.  We will repeat injection Eylea OS today and examination repeated in 8 weeks  2. OD, the findings were reviewed with the patient in serial fashion over the last for 5 months during which time drusenoid pigment epithelial detachments improved in size  and extent to a point in March 2021.  Now new subfoveal multilobular pigment epithelial detachments have developed in the subfoveal location OD.  The same types of pigment epithelial detachments that developed CNVM formation of the left eye.  Thus I have suggested and will recommend injection of Eylea in the right eye to commence next week  3.  Ophthalmic Meds Ordered this visit:  Meds ordered this encounter  Medications  . aflibercept (EYLEA) SOLN 2 mg       Return in about 1 week (around 06/11/2020), or ,,,,, Increase interval examination left eye to 8 weeks, injection Eylea next, for dilate, OD, EYLEA OCT.  There are no Patient Instructions on file for this visit.   Explained the diagnoses, plan, and follow up with the patient and they expressed understanding.  Patient expressed understanding of the importance of proper follow up care.   Clent Demark Jaxden Blyden M.D. Diseases & Surgery of the Retina and Vitreous Retina & Diabetic [place] 06/04/20     Abbreviations: M myopia (nearsighted); A astigmatism; H hyperopia (farsighted); P presbyopia; Mrx spectacle prescription;  CTL contact lenses; OD right eye; OS left eye; OU both eyes  XT exotropia; ET esotropia; PEK punctate epithelial keratitis; PEE punctate epithelial erosions; DES dry eye syndrome; MGD meibomian gland dysfunction; ATs artificial tears; PFAT's preservative free artificial tears; Ramirez-Perez nuclear sclerotic cataract; PSC posterior subcapsular cataract; ERM epi-retinal membrane; PVD posterior vitreous detachment; RD retinal detachment; DM diabetes mellitus; DR diabetic retinopathy; NPDR non-proliferative diabetic retinopathy; PDR proliferative diabetic retinopathy; CSME clinically significant macular edema; DME diabetic macular edema; dbh dot blot hemorrhages; CWS cotton wool spot; POAG primary open angle glaucoma; C/D cup-to-disc ratio; HVF humphrey visual field; GVF goldmann visual  field; OCT optical coherence tomography; IOP  intraocular pressure; BRVO Branch retinal vein occlusion; CRVO central retinal vein occlusion; CRAO central retinal artery occlusion; BRAO branch retinal artery occlusion; RT retinal tear; SB scleral buckle; PPV pars plana vitrectomy; VH Vitreous hemorrhage; PRP panretinal laser photocoagulation; IVK intravitreal kenalog; VMT vitreomacular traction; MH Macular hole;  NVD neovascularization of the disc; NVE neovascularization elsewhere; AREDS age related eye disease study; ARMD age related macular degeneration; POAG primary open angle glaucoma; EBMD epithelial/anterior basement membrane dystrophy; ACIOL anterior chamber intraocular lens; IOL intraocular lens; PCIOL posterior chamber intraocular lens; Phaco/IOL phacoemulsification with intraocular lens placement; [place] photorefractive keratectomy; LASIK laser assisted in situ keratomileusis; HTN hypertension; DM diabetes mellitus; COPD chronic obstructive pulmonary disease

## 2020-06-11 ENCOUNTER — Ambulatory Visit (INDEPENDENT_AMBULATORY_CARE_PROVIDER_SITE_OTHER): Payer: PPO | Admitting: Ophthalmology

## 2020-06-11 ENCOUNTER — Encounter (INDEPENDENT_AMBULATORY_CARE_PROVIDER_SITE_OTHER): Payer: Self-pay | Admitting: Ophthalmology

## 2020-06-11 ENCOUNTER — Other Ambulatory Visit: Payer: Self-pay

## 2020-06-11 DIAGNOSIS — H353211 Exudative age-related macular degeneration, right eye, with active choroidal neovascularization: Secondary | ICD-10-CM | POA: Diagnosis not present

## 2020-06-11 MED ORDER — AFLIBERCEPT 2MG/0.05ML IZ SOLN FOR KALEIDOSCOPE
2.0000 mg | INTRAVITREAL | Status: AC | PRN
  Administered 2020-06-11: 2 mg via INTRAVITREAL

## 2020-06-11 NOTE — Progress Notes (Signed)
06/11/2020     CHIEF COMPLAINT Patient presents for Retina Follow Up   HISTORY OF PRESENT ILLNESS: Angie Liu is a 74 y.o. female who presents to the clinic today for:   HPI    Retina Follow Up    Patient presents with  Wet AMD.  In right eye.  Duration of 1 week.  Since onset it is stable.          Comments    1 week follow up - OCT OU, poss Eylea OD Patient denies change in vision and overall has no complaints.        Last edited by Gerda Diss on 06/11/2020 10:04 AM. (History)      Referring physician: Hayden Rasmussen, MD [address] [place],  [place] [address]  HISTORICAL INFORMATION:   Selected notes from the MEDICAL RECORD NUMBER       CURRENT MEDICATIONS: No current outpatient medications on file. (Ophthalmic Drugs)   No current facility-administered medications for this visit. (Ophthalmic Drugs)   Current Outpatient Medications (Other)  Medication Sig  . ALPRAZolam (XANAX) 0.5 MG tablet Take 1 tab po QHS and 1 tab po qd prn anxiety  . chlorpheniramine (CHLOR-TRIMETON) 4 MG tablet Take 4 mg by mouth 2 (two) times daily as needed for allergies.   . Cholecalciferol (VITAMIN D3) 1000 units CAPS Take by mouth.  . doxepin (SINEQUAN) 75 MG capsule Take 1 capsule (75 mg total) by mouth at bedtime.  . fluocinonide (LIDEX) 0.05 % external solution fluocinonide 0.05 % topical solution  . FOLIC ACID PO Take by mouth.  . melatonin 3 MG TABS tablet Take 3 mg by mouth at bedtime.  . Multiple Vitamins-Minerals (PRESERVISION AREDS PO) Take 1 tablet by mouth 2 (two) times daily.  . mupirocin ointment (BACTROBAN) 2 % mupirocin 2 % topical ointment  . OVER THE COUNTER MEDICATION 4 capsules daily. Calm CP, takes 1 breakfast, 1 at dinner and 2 at bedtime  . OVER THE COUNTER MEDICATION 1 capsule at bedtime. Kavinace capusle  . OVER THE COUNTER MEDICATION Place 1 tablet under the tongue every morning. [place]  . OVER THE COUNTER MEDICATION Place 1 tablet under  the tongue. Butyra  . OVER THE COUNTER MEDICATION Alpha gabba  . Progesterone Micronized (PROGESTERONE PO) Take 2 capsules by mouth at bedtime. 1:1 SR compound medication at [facility].  . THEANINE PO Take by mouth.  Marland Kitchen UNABLE TO FIND every 30 (thirty) days. Formula A16B  . UNABLE TO FIND Sarspa  . zolpidem (AMBIEN CR) 12.5 MG CR tablet Take 1 tablet (12.5 mg total) by mouth at bedtime.   No current facility-administered medications for this visit. (Other)      REVIEW OF SYSTEMS:    ALLERGIES Allergies  Allergen Reactions  . Latex Rash    PAST MEDICAL HISTORY Past Medical History:  Diagnosis Date  . Anxiety   . Depression   . Hayfever    Past Surgical History:  Procedure Laterality Date  . CATARACT EXTRACTION W/PHACO Bilateral 2018  . TONSILLECTOMY      FAMILY HISTORY Family History  Problem Relation Age of Onset  . Cancer Mother        colon  . Depression Mother   . Diabetes Father   . Depression Father   . Depression Son   . Depression Paternal Uncle     SOCIAL HISTORY Social History   Tobacco Use  . Smoking status: Never Smoker  . Smokeless tobacco: Never  Used  Substance Use Topics  . Alcohol use: Yes  . Drug use: No         OPHTHALMIC EXAM:  Base Eye Exam    Visual Acuity (Snellen - Linear)      Right Left   Dist cc 20/25+2 20/20-1   Correction: Glasses       Tonometry (Tonopen, 10:11 AM)      Right Left   Pressure 10 10       Pupils      Pupils Dark Light Shape React APD   Right PERRL 4 3 Round Brisk None   Left PERRL 4 3 Round Brisk None       Visual Fields (Counting fingers)      Left Right    Full Full       Extraocular Movement      Right Left    Full Full       Neuro/Psych    Oriented x3: Yes   Mood/Affect: Normal       Dilation    Right eye: 1.0% Mydriacyl, 2.5% Phenylephrine @ 10:11 AM        Slit Lamp and Fundus Exam    External Exam      Right Left   External Normal Normal       Slit Lamp Exam       Right Left   Lids/Lashes Normal Normal   Conjunctiva/Sclera White and quiet White and quiet   Cornea Clear Clear   Anterior Chamber Deep and quiet Deep and quiet   Iris Round and reactive Round and reactive   Lens Posterior chamber intraocular lens Posterior chamber intraocular lens   Vitreous Normal Normal          IMAGING AND PROCEDURES  Imaging and Procedures for 06/11/20  Intravitreal Injection, Pharmacologic Agent - OD - Right Eye       Time Out 06/11/2020. 10:27 AM. Confirmed correct patient, procedure, site, and patient consented.   Anesthesia Topical anesthesia was used. Anesthetic medications included Akten 3.5%.   Procedure Preparation included Ofloxacin , Tobramycin 0.3%, 10% betadine to eyelids, 5% betadine to ocular surface. A 30 gauge needle was used.   Injection:  2 mg aflibercept Alfonse Flavors) SOLN   NDC: A3590391, Lot: [id number]   Route: Intravitreal, Site: Right Eye, Waste: 0 mg  Post-op Post injection exam found visual acuity of at least counting fingers. The patient tolerated the procedure well. There were no complications. The patient received written and verbal post procedure care education. Post injection medications were not given.                 ASSESSMENT/PLAN:  Exudative age-related macular degeneration of right eye with active choroidal neovascularization ([place]) Commence with intravitreal Eylea OD today      ICD-10-CM   1. Exudative age-related macular degeneration of right eye with active choroidal neovascularization (HCC)  H35.3211 Intravitreal Injection, Pharmacologic Agent - OD - Right Eye    aflibercept (EYLEA) SOLN 2 mg    1.  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  Follow-up left eye as scheduled  3.  Ophthalmic Meds Ordered this  visit:  Meds ordered this encounter  Medications  . aflibercept (EYLEA) SOLN 2 mg       Return in about 6 weeks (around 07/23/2020) for dilate, OD, EYLEA  OCT.  There are no Patient Instructions on file for this visit.   Explained the diagnoses, plan, and follow up with the patient and they expressed understanding.  Patient expressed understanding of the importance of proper follow up care.   Clent Demark Rushi Chasen M.D. Diseases & Surgery of the Retina and Vitreous Retina & Diabetic [place] 06/11/20     Abbreviations: M myopia (nearsighted); A astigmatism; H hyperopia (farsighted); P presbyopia; Mrx spectacle prescription;  CTL contact lenses; OD right eye; OS left eye; OU both eyes  XT exotropia; ET esotropia; PEK punctate epithelial keratitis; PEE punctate epithelial erosions; DES dry eye syndrome; MGD meibomian gland dysfunction; ATs artificial tears; PFAT's preservative free artificial tears; [place] nuclear sclerotic cataract; PSC posterior subcapsular cataract; ERM epi-retinal membrane; PVD posterior vitreous detachment; RD retinal detachment; DM diabetes mellitus; DR diabetic retinopathy; NPDR non-proliferative diabetic retinopathy; PDR proliferative diabetic retinopathy; CSME clinically significant macular edema; DME diabetic macular edema; dbh dot blot hemorrhages; CWS cotton wool spot; POAG primary open angle glaucoma; C/D cup-to-disc ratio; HVF humphrey visual field; GVF goldmann visual field; OCT optical coherence tomography; IOP intraocular pressure; BRVO Branch retinal vein occlusion; CRVO central retinal vein occlusion; CRAO central retinal artery occlusion; BRAO branch retinal artery occlusion; RT retinal tear; SB scleral buckle; PPV pars plana vitrectomy; VH Vitreous hemorrhage; PRP panretinal laser photocoagulation; IVK intravitreal kenalog; VMT vitreomacular traction; MH Macular hole;  NVD neovascularization of the disc; NVE neovascularization elsewhere; AREDS age related eye disease  study; ARMD age related macular degeneration; POAG primary open angle glaucoma; EBMD epithelial/anterior basement membrane dystrophy; ACIOL anterior chamber intraocular lens; IOL intraocular lens; PCIOL posterior chamber intraocular lens; Phaco/IOL phacoemulsification with intraocular lens placement; [place] photorefractive keratectomy; LASIK laser assisted in situ keratomileusis; HTN hypertension; DM diabetes mellitus; COPD chronic obstructive pulmonary disease

## 2020-06-11 NOTE — Assessment & Plan Note (Signed)
Commence with intravitreal Eylea OD today

## 2020-06-25 DIAGNOSIS — M9902 Segmental and somatic dysfunction of thoracic region: Secondary | ICD-10-CM | POA: Diagnosis not present

## 2020-06-25 DIAGNOSIS — M9903 Segmental and somatic dysfunction of lumbar region: Secondary | ICD-10-CM | POA: Diagnosis not present

## 2020-06-25 DIAGNOSIS — M9901 Segmental and somatic dysfunction of cervical region: Secondary | ICD-10-CM | POA: Diagnosis not present

## 2020-06-25 DIAGNOSIS — M5030 Other cervical disc degeneration, unspecified cervical region: Secondary | ICD-10-CM | POA: Diagnosis not present

## 2020-06-25 DIAGNOSIS — M5136 Other intervertebral disc degeneration, lumbar region: Secondary | ICD-10-CM | POA: Diagnosis not present

## 2020-06-25 DIAGNOSIS — M9904 Segmental and somatic dysfunction of sacral region: Secondary | ICD-10-CM | POA: Diagnosis not present

## 2020-07-02 DIAGNOSIS — M9902 Segmental and somatic dysfunction of thoracic region: Secondary | ICD-10-CM | POA: Diagnosis not present

## 2020-07-02 DIAGNOSIS — M5136 Other intervertebral disc degeneration, lumbar region: Secondary | ICD-10-CM | POA: Diagnosis not present

## 2020-07-02 DIAGNOSIS — M5030 Other cervical disc degeneration, unspecified cervical region: Secondary | ICD-10-CM | POA: Diagnosis not present

## 2020-07-02 DIAGNOSIS — M9901 Segmental and somatic dysfunction of cervical region: Secondary | ICD-10-CM | POA: Diagnosis not present

## 2020-07-02 DIAGNOSIS — M9903 Segmental and somatic dysfunction of lumbar region: Secondary | ICD-10-CM | POA: Diagnosis not present

## 2020-07-02 DIAGNOSIS — M9904 Segmental and somatic dysfunction of sacral region: Secondary | ICD-10-CM | POA: Diagnosis not present

## 2020-07-23 ENCOUNTER — Other Ambulatory Visit: Payer: Self-pay

## 2020-07-23 ENCOUNTER — Ambulatory Visit (INDEPENDENT_AMBULATORY_CARE_PROVIDER_SITE_OTHER): Payer: PPO | Admitting: Ophthalmology

## 2020-07-23 ENCOUNTER — Encounter (INDEPENDENT_AMBULATORY_CARE_PROVIDER_SITE_OTHER): Payer: Self-pay | Admitting: Ophthalmology

## 2020-07-23 DIAGNOSIS — H353211 Exudative age-related macular degeneration, right eye, with active choroidal neovascularization: Secondary | ICD-10-CM | POA: Diagnosis not present

## 2020-07-23 DIAGNOSIS — H353221 Exudative age-related macular degeneration, left eye, with active choroidal neovascularization: Secondary | ICD-10-CM

## 2020-07-23 MED ORDER — AFLIBERCEPT 2MG/0.05ML IZ SOLN FOR KALEIDOSCOPE
2.0000 mg | INTRAVITREAL | Status: AC | PRN
  Administered 2020-07-23: 2 mg via INTRAVITREAL

## 2020-07-23 NOTE — Assessment & Plan Note (Signed)
Right eye, status post Eylea No. 1 for subfoveal new progressive pigment epithelial detachment similar to what left eye has improved from

## 2020-07-23 NOTE — Patient Instructions (Signed)
And asked to report promptly new onset visual acuity decline torsion in either eye

## 2020-07-23 NOTE — Assessment & Plan Note (Signed)
Injection left eye appears to have triggered complete and full resolution of subfoveal multifocal pigment epithelial detachments.

## 2020-07-23 NOTE — Progress Notes (Signed)
07/23/2020     CHIEF COMPLAINT Patient presents for Retina Follow Up   HISTORY OF PRESENT ILLNESS: Angie Liu is a 74 y.o. female who presents to the clinic today for:   HPI    Retina Follow Up    Patient presents with  Wet AMD.  In right eye.  Severity is moderate.  Duration of 6 weeks.  Since onset it is stable.  I, the attending physician,  performed the HPI with the patient and updated documentation appropriately.          Comments    6 Week Wet AMD f\u OD. Possible Eylea OD. OCT  Pt c/o OU being tired at night. Pt states she sees double when OU are tired.       Last edited by Angie Liu on 07/23/2020  9:02 AM. (History)      Referring physician: Hayden Rasmussen, MD [address],  [place] [address]  HISTORICAL INFORMATION:   Selected notes from the MEDICAL RECORD NUMBER       CURRENT MEDICATIONS: No current outpatient medications on file. (Ophthalmic Drugs)   No current facility-administered medications for this visit. (Ophthalmic Drugs)   Current Outpatient Medications (Other)  Medication Sig  . ALPRAZolam (XANAX) 0.5 MG tablet Take 1 tab po QHS and 1 tab po qd prn anxiety  . chlorpheniramine (CHLOR-TRIMETON) 4 MG tablet Take 4 mg by mouth 2 (two) times daily as needed for allergies.   . Cholecalciferol (VITAMIN D3) 1000 units CAPS Take by mouth.  . doxepin (SINEQUAN) 75 MG capsule Take 1 capsule (75 mg total) by mouth at bedtime.  . fluocinonide (LIDEX) 0.05 % external solution fluocinonide 0.05 % topical solution  . FOLIC ACID PO Take by mouth.  . melatonin 3 MG TABS tablet Take 3 mg by mouth at bedtime.  . Multiple Vitamins-Minerals (PRESERVISION AREDS PO) Take 1 tablet by mouth 2 (two) times daily.  . mupirocin ointment (BACTROBAN) 2 % mupirocin 2 % topical ointment  . OVER THE COUNTER MEDICATION 4 capsules daily. Calm CP, takes 1 breakfast, 1 at dinner and 2 at bedtime  . OVER THE COUNTER MEDICATION 1 capsule at bedtime.  Kavinace capusle  . OVER THE COUNTER MEDICATION Place 1 tablet under the tongue every morning. [place]  . OVER THE COUNTER MEDICATION Place 1 tablet under the tongue. Butyra  . OVER THE COUNTER MEDICATION Alpha gabba  . Progesterone Micronized (PROGESTERONE PO) Take 2 capsules by mouth at bedtime. 1:1 SR compound medication at [facility].  . THEANINE PO Take by mouth.  Marland Kitchen UNABLE TO FIND every 30 (thirty) days. Formula A16B  . UNABLE TO FIND Sarspa  . zolpidem (AMBIEN CR) 12.5 MG CR tablet Take 1 tablet (12.5 mg total) by mouth at bedtime.   No current facility-administered medications for this visit. (Other)      REVIEW OF SYSTEMS:    ALLERGIES Allergies  Allergen Reactions  . Latex Rash    PAST MEDICAL HISTORY Past Medical History:  Diagnosis Date  . Anxiety   . Depression   . Hayfever    Past Surgical History:  Procedure Laterality Date  . CATARACT EXTRACTION W/PHACO Bilateral 2018  . TONSILLECTOMY      FAMILY HISTORY Family History  Problem Relation Age of Onset  . Cancer Mother        colon  . Depression Mother   . Diabetes Father   . Depression Father   . Depression Son   .  Depression Paternal Uncle     SOCIAL HISTORY Social History   Tobacco Use  . Smoking status: Never Smoker  . Smokeless tobacco: Never Used  Substance Use Topics  . Alcohol use: Yes  . Drug use: No         OPHTHALMIC EXAM: Base Eye Exam    Visual Acuity (Snellen - Linear)      Right Left   Dist cc 20/25 -1 20/25 +   Correction: Glasses       Tonometry (Tonopen, 9:06 AM)      Right Left   Pressure 11 12       Pupils      Pupils Dark Light Shape React APD   Right PERRL 4 3 Round Brisk None   Left PERRL 4 3 Round Brisk None       Visual Fields (Counting fingers)      Left Right    Full Full       Neuro/Psych    Oriented x3: Yes   Mood/Affect: Normal       Dilation    Right eye: 1.0% Mydriacyl, 2.5% Phenylephrine @ 9:06 AM        Slit Lamp and Fundus  Exam    External Exam      Right Left   External Normal Normal       Slit Lamp Exam      Right Left   Lids/Lashes Normal Normal   Conjunctiva/Sclera White and quiet White and quiet   Cornea Clear Clear   Anterior Chamber Deep and quiet Deep and quiet   Iris Round and reactive Round and reactive   Lens Posterior chamber intraocular lens Posterior chamber intraocular lens   Anterior Vitreous Normal Normal          IMAGING AND PROCEDURES  Imaging and Procedures for 07/23/20  OCT, Retina - OU - Both Eyes       Right Eye Quality was good. Scan locations included subfoveal. Central Foveal Thickness: 286. Progression has been stable. Findings include pigment epithelial detachment.   Left Eye Quality was good. Scan locations included subfoveal. Central Foveal Thickness: 241. Progression has improved. Findings include no SRF.   Notes OS resolution of subfoveal pigment epithelial detachment multifocal, last injection in June 04, 2020.  OD, stable configuration of subfoveal pigment epithelial detachment After first injection intravitreal Eylea OD       Intravitreal Injection, Pharmacologic Agent - OD - Right Eye       Time Out 07/23/2020. 9:39 AM. Confirmed correct patient, procedure, site, and patient consented.   Anesthesia Topical anesthesia was used. Anesthetic medications included Akten 3.5%.   Procedure Preparation included Ofloxacin , Tobramycin 0.3%, 10% betadine to eyelids, 5% betadine to ocular surface. A 30 gauge needle was used.   Injection:  2 mg aflibercept Alfonse Flavors) SOLN   NDC: A3590391, Lot: [id number]   Route: Intravitreal, Site: Right Eye, Waste: 0 mg  Post-op Post injection exam found visual acuity of at least counting fingers. The patient tolerated the procedure well. There were no complications. The patient received written and verbal post procedure care education. Post injection medications were not given.                   ASSESSMENT/PLAN:  Exudative age-related macular degeneration of left eye with active choroidal neovascularization (HCC) Injection left eye appears to have triggered complete and full resolution of subfoveal multifocal pigment epithelial detachments.  Exudative age-related macular degeneration of right eye with active choroidal  neovascularization (HCC) Right eye, status post Eylea No. 1 for subfoveal new progressive pigment epithelial detachment similar to what left eye has improved from      ICD-10-CM   1. Exudative age-related macular degeneration of right eye with active choroidal neovascularization (HCC)  H35.3211 OCT, Retina - OU - Both Eyes    Intravitreal Injection, Pharmacologic Agent - OD - Right Eye    aflibercept (EYLEA) SOLN 2 mg  2. Exudative age-related macular degeneration of left eye with active choroidal neovascularization ([place])  H35.3221     1.  Repeat injection intravitreal Eylea today, at 6-week interval, stable condition  2.  Next week left eye as scheduled, at 8-week interval at that point.  3.  Ophthalmic Meds Ordered this visit:  Meds ordered this encounter  Medications  . aflibercept (EYLEA) SOLN 2 mg       Return in about 6 weeks (around 09/03/2020) for dilate, OD, EYLEA OCT.  There are no Patient Instructions on file for this visit.   Explained the diagnoses, plan, and follow up with the patient and they expressed understanding.  Patient expressed understanding of the importance of proper follow up care.   Clent Demark Amerah Puleo M.D. Diseases & Surgery of the Retina and Vitreous Retina & Diabetic [place] 07/23/20     Abbreviations: M myopia (nearsighted); A astigmatism; H hyperopia (farsighted); P presbyopia; Mrx spectacle prescription;  CTL contact lenses; OD right eye; OS left eye; OU both eyes  XT exotropia; ET esotropia; PEK punctate epithelial keratitis; PEE punctate epithelial erosions; DES dry eye syndrome; MGD meibomian gland dysfunction;  ATs artificial tears; PFAT's preservative free artificial tears; [place] nuclear sclerotic cataract; PSC posterior subcapsular cataract; ERM epi-retinal membrane; PVD posterior vitreous detachment; RD retinal detachment; DM diabetes mellitus; DR diabetic retinopathy; NPDR non-proliferative diabetic retinopathy; PDR proliferative diabetic retinopathy; CSME clinically significant macular edema; DME diabetic macular edema; dbh dot blot hemorrhages; CWS cotton wool spot; POAG primary open angle glaucoma; C/D cup-to-disc ratio; HVF humphrey visual field; GVF goldmann visual field; OCT optical coherence tomography; IOP intraocular pressure; BRVO Branch retinal vein occlusion; CRVO central retinal vein occlusion; CRAO central retinal artery occlusion; BRAO branch retinal artery occlusion; RT retinal tear; SB scleral buckle; PPV pars plana vitrectomy; VH Vitreous hemorrhage; PRP panretinal laser photocoagulation; IVK intravitreal kenalog; VMT vitreomacular traction; MH Macular hole;  NVD neovascularization of the disc; NVE neovascularization elsewhere; AREDS age related eye disease study; ARMD age related macular degeneration; POAG primary open angle glaucoma; EBMD epithelial/anterior basement membrane dystrophy; ACIOL anterior chamber intraocular lens; IOL intraocular lens; PCIOL posterior chamber intraocular lens; Phaco/IOL phacoemulsification with intraocular lens placement; [place] photorefractive keratectomy; LASIK laser assisted in situ keratomileusis; HTN hypertension; DM diabetes mellitus; COPD chronic obstructive pulmonary disease

## 2020-07-29 DIAGNOSIS — G4700 Insomnia, unspecified: Secondary | ICD-10-CM | POA: Diagnosis not present

## 2020-07-29 DIAGNOSIS — R600 Localized edema: Secondary | ICD-10-CM | POA: Diagnosis not present

## 2020-07-29 DIAGNOSIS — Z7689 Persons encountering health services in other specified circumstances: Secondary | ICD-10-CM | POA: Diagnosis not present

## 2020-07-29 DIAGNOSIS — Z23 Encounter for immunization: Secondary | ICD-10-CM | POA: Diagnosis not present

## 2020-07-29 DIAGNOSIS — F39 Unspecified mood [affective] disorder: Secondary | ICD-10-CM | POA: Diagnosis not present

## 2020-07-30 ENCOUNTER — Other Ambulatory Visit: Payer: Self-pay

## 2020-07-30 ENCOUNTER — Encounter (INDEPENDENT_AMBULATORY_CARE_PROVIDER_SITE_OTHER): Payer: Self-pay | Admitting: Ophthalmology

## 2020-07-30 ENCOUNTER — Ambulatory Visit (INDEPENDENT_AMBULATORY_CARE_PROVIDER_SITE_OTHER): Payer: PPO | Admitting: Ophthalmology

## 2020-07-30 DIAGNOSIS — H353221 Exudative age-related macular degeneration, left eye, with active choroidal neovascularization: Secondary | ICD-10-CM | POA: Diagnosis not present

## 2020-07-30 MED ORDER — AFLIBERCEPT 2MG/0.05ML IZ SOLN FOR KALEIDOSCOPE
2.0000 mg | INTRAVITREAL | Status: AC | PRN
  Administered 2020-07-30: 2 mg via INTRAVITREAL

## 2020-07-30 NOTE — Progress Notes (Signed)
07/30/2020     CHIEF COMPLAINT Patient presents for Retina Follow Up   HISTORY OF PRESENT ILLNESS: Angie Liu is a 74 y.o. female who presents to the clinic today for:   HPI    Retina Follow Up    Patient presents with  Wet AMD.  In left eye.  This started 8 weeks ago.  Severity is mild.  Duration of 8 weeks.  Since onset it is stable.          Comments    8 Week AMD F/U OS, poss Eylea OS  Pt denies noticeable changes to [place] OU since last visit. Pt denies  flashes of light or floaters OU. Pt c/o pain OD the day of the injection and sts she had a hard time opening the eye. Pt sts pain improved.        Last edited by Rockie Neighbours, [place] on 07/30/2020 10:38 AM. (History)      Referring physician: Hayden Rasmussen, MD [address] [place],  [place] [id number]  HISTORICAL INFORMATION:   Selected notes from the MEDICAL RECORD NUMBER       CURRENT MEDICATIONS: No current outpatient medications on file. (Ophthalmic Drugs)   No current facility-administered medications for this visit. (Ophthalmic Drugs)   Current Outpatient Medications (Other)  Medication Sig  . ALPRAZolam (XANAX) 0.5 MG tablet Take 1 tab po QHS and 1 tab po qd prn anxiety  . chlorpheniramine (CHLOR-TRIMETON) 4 MG tablet Take 4 mg by mouth 2 (two) times daily as needed for allergies.   . Cholecalciferol (VITAMIN D3) 1000 units CAPS Take by mouth.  . doxepin (SINEQUAN) 75 MG capsule Take 1 capsule (75 mg total) by mouth at bedtime.  . fluocinonide (LIDEX) 0.05 % external solution fluocinonide 0.05 % topical solution  . FOLIC ACID PO Take by mouth.  . melatonin 3 MG TABS tablet Take 3 mg by mouth at bedtime.  . Multiple Vitamins-Minerals (PRESERVISION AREDS PO) Take 1 tablet by mouth 2 (two) times daily.  . mupirocin ointment (BACTROBAN) 2 % mupirocin 2 % topical ointment  . OVER THE COUNTER MEDICATION 4 capsules daily. Calm CP, takes 1 breakfast, 1 at dinner and 2 at bedtime  . OVER THE COUNTER  MEDICATION 1 capsule at bedtime. Kavinace capusle  . OVER THE COUNTER MEDICATION Place 1 tablet under the tongue every morning. [place]  . OVER THE COUNTER MEDICATION Place 1 tablet under the tongue. Butyra  . OVER THE COUNTER MEDICATION Alpha gabba  . Progesterone Micronized (PROGESTERONE PO) Take 2 capsules by mouth at bedtime. 1:1 SR compound medication at [facility].  . THEANINE PO Take by mouth.  Marland Kitchen UNABLE TO FIND every 30 (thirty) days. Formula A16B  . UNABLE TO FIND Sarspa  . zolpidem (AMBIEN CR) 12.5 MG CR tablet Take 1 tablet (12.5 mg total) by mouth at bedtime.   No current facility-administered medications for this visit. (Other)      REVIEW OF SYSTEMS:    ALLERGIES Allergies  Allergen Reactions  . Latex Rash    PAST MEDICAL HISTORY Past Medical History:  Diagnosis Date  . Anxiety   . Depression   . Hayfever    Past Surgical History:  Procedure Laterality Date  . CATARACT EXTRACTION W/PHACO Bilateral 2018  . TONSILLECTOMY      FAMILY HISTORY Family History  Problem Relation Age of Onset  . Cancer Mother        colon  . Depression Mother   .  Diabetes Father   . Depression Father   . Depression Son   . Depression Paternal Uncle     SOCIAL HISTORY Social History   Tobacco Use  . Smoking status: Never Smoker  . Smokeless tobacco: Never Used  Substance Use Topics  . Alcohol use: Yes  . Drug use: No         OPHTHALMIC EXAM:  Base Eye Exam    Visual Acuity (ETDRS)      Right Left   Dist cc 20/30 -2 20/20 -2   Dist ph cc 20/25 +1    Correction: Glasses       Tonometry (Tonopen, 10:40 AM)      Right Left   Pressure 08 09       Pupils      Pupils Dark Light Shape React APD   Right PERRL 4 3 Round Brisk None   Left PERRL 4 3 Round Brisk None       Visual Fields (Counting fingers)      Left Right    Full Full       Extraocular Movement      Right Left    Full Full       Neuro/Psych    Oriented x3: Yes   Mood/Affect: Normal         Dilation    Left eye: 1.0% Mydriacyl, 2.5% Phenylephrine @ 10:42 AM        Slit Lamp and Fundus Exam    Slit Lamp Exam      Right Left   Lids/Lashes Normal Normal   Conjunctiva/Sclera White and quiet White and quiet   Cornea Clear Clear   Anterior Chamber Deep and quiet Deep and quiet   Iris Round and reactive Round and reactive   Lens Posterior chamber intraocular lens Posterior chamber intraocular lens   Anterior Vitreous Normal Normal       Fundus Exam      Right Left   Posterior Vitreous Normal Posterior vitreous detachment   Disc  Normal   C/D Ratio  0.6   Macula  Atrophy, Retinal pigment epithelial detachment, [place], no exudates, no hemorrhage, no macular thickening   Vessels  Normal   Periphery  Normal          IMAGING AND PROCEDURES  Imaging and Procedures for 07/30/20  OCT, Retina - OU - Both Eyes       Right Eye Quality was good. Scan locations included subfoveal. Central Foveal Thickness: 263. Progression has been stable. Findings include no IRF, no SRF, pigment epithelial detachment.   Left Eye Quality was good. Scan locations included subfoveal. Central Foveal Thickness: 243. Progression has been stable. Findings include pigment epithelial detachment.   Notes  Much less subretinal fluid, resolved pigment epithelial detachment left eye as compared to onset some 8 months prior.  Persistent PED, no intraretinal or subretinal fluid, 7 days post injection       Intravitreal Injection, Pharmacologic Agent - OS - Left Eye       Time Out 07/30/2020. 11:45 AM. Confirmed correct patient, procedure, site, and patient consented.   Anesthesia Anesthetic medications included Akten 3.5%.   Procedure Preparation included 10% betadine to eyelids, 5% betadine to ocular surface, Tobramycin 0.3%. A 30 gauge needle was used.   Injection:  2 mg aflibercept Alfonse Flavors) SOLN   NDC: A3590391, Lot: [id number]   Route: Intravitreal, Site: Left Eye, Waste:  0 mg  Post-op Post injection exam found visual acuity of at least counting  fingers. The patient tolerated the procedure well. There were no complications. The patient received written and verbal post procedure care education. Post injection medications were not given.                 ASSESSMENT/PLAN:  Exudative age-related macular degeneration of left eye with active choroidal neovascularization (HCC) The nature of wet macular degeneration was discussed with the patient.  Forms of therapy reviewed include the use of Anti-VEGF medications injected painlessly into the eye, as well as other possible treatment modalities, including thermal laser therapy. Fellow eye involvement and risks were discussed with the patient. Upon the finding of wet age related macular degeneration, treatment will be offered. The treatment regimen is on a treat as needed basis with the intent to treat if necessary and extend interval of exams when possible. On average 1 out of 6 patients do not need lifetime therapy. However, the risk of recurrent disease is high for a lifetime.  Initially monthly, then periodic, examinations and evaluations will determine whether the next treatment is required on the day of the examination.  OS much improved over the last 8 months, will repeat injection today and follow-up left eye in 10 weeks      ICD-10-CM   1. Exudative age-related macular degeneration of left eye with active choroidal neovascularization (HCC)  H35.3221 OCT, Retina - OU - Both Eyes    Intravitreal Injection, Pharmacologic Agent - OS - Left Eye    aflibercept (EYLEA) SOLN 2 mg    1.  Repeat intravitreal Eylea OS today and extend exam interval next visit left eye to 10 weeks  2.  Examination right eye as scheduled  3.  Ophthalmic Meds Ordered this visit:  Meds ordered this encounter  Medications  . aflibercept (EYLEA) SOLN 2 mg       Return in about 10 weeks (around 10/08/2020) for dilate, OS, EYLEA  OCT.  Patient Instructions  Patient asked to report promptly new onset visual acuity decline or distortion  Patient I discussed the possibility of foreign body sensation after it is from corneal anesthesia, drying and and subsequently minor abrasion because typically it does prove overnight with lid closure      Explained the diagnoses, plan, and follow up with the patient and they expressed understanding.  Patient expressed understanding of the importance of proper follow up care.   Clent Demark Alexavier Tsutsui M.D. Diseases & Surgery of the Retina and Vitreous Retina [facility] Diabetic [place] 07/30/20     Abbreviations: M myopia (nearsighted); A astigmatism; H hyperopia (farsighted); P presbyopia; Mrx spectacle prescription;  CTL contact lenses; OD right eye; OS left eye; OU both eyes  XT exotropia; ET esotropia; PEK punctate epithelial keratitis; PEE punctate epithelial erosions; DES dry eye syndrome; MGD meibomian gland dysfunction; ATs artificial tears; PFAT's preservative free artificial tears; [place] nuclear sclerotic cataract; PSC posterior subcapsular cataract; ERM epi-retinal membrane; PVD posterior vitreous detachment; RD retinal detachment; DM diabetes mellitus; DR diabetic retinopathy; NPDR non-proliferative diabetic retinopathy; PDR proliferative diabetic retinopathy; CSME clinically significant macular edema; DME diabetic macular edema; dbh dot blot hemorrhages; CWS cotton wool spot; POAG primary open angle glaucoma; C/D cup-to-disc ratio; HVF humphrey visual field; GVF goldmann visual field; OCT optical coherence tomography; IOP intraocular pressure; BRVO Branch retinal vein occlusion; CRVO central retinal vein occlusion; CRAO central retinal artery occlusion; BRAO branch retinal artery occlusion; RT retinal tear; SB scleral buckle; PPV pars plana vitrectomy; VH Vitreous hemorrhage; PRP panretinal laser photocoagulation; IVK intravitreal kenalog; VMT vitreomacular traction;  MH Macular hole;  NVD  neovascularization of the disc; NVE neovascularization elsewhere; AREDS age related eye disease study; ARMD age related macular degeneration; POAG primary open angle glaucoma; EBMD epithelial/anterior basement membrane dystrophy; ACIOL anterior chamber intraocular lens; IOL intraocular lens; PCIOL posterior chamber intraocular lens; Phaco/IOL phacoemulsification with intraocular lens placement; [place] photorefractive keratectomy; LASIK laser assisted in situ keratomileusis; HTN hypertension; DM diabetes mellitus; COPD chronic obstructive pulmonary disease

## 2020-07-30 NOTE — Assessment & Plan Note (Signed)
The nature of wet macular degeneration was discussed with the patient.  Forms of therapy reviewed include the use of Anti-VEGF medications injected painlessly into the eye, as well as other possible treatment modalities, including thermal laser therapy. Fellow eye involvement and risks were discussed with the patient. Upon the finding of wet age related macular degeneration, treatment will be offered. The treatment regimen is on a treat as needed basis with the intent to treat if necessary and extend interval of exams when possible. On average 1 out of 6 patients do not need lifetime therapy. However, the risk of recurrent disease is high for a lifetime.  Initially monthly, then periodic, examinations and evaluations will determine whether the next treatment is required on the day of the examination.  OS much improved over the last 8 months, will repeat injection today and follow-up left eye in 10 weeks

## 2020-07-30 NOTE — Patient Instructions (Signed)
Patient asked to report promptly new onset visual acuity decline or distortion  Patient I discussed the possibility of foreign body sensation after it is from corneal anesthesia, drying and and subsequently minor abrasion because typically it does prove overnight with lid closure

## 2020-08-14 DIAGNOSIS — N951 Menopausal and female climacteric states: Secondary | ICD-10-CM | POA: Diagnosis not present

## 2020-08-22 DIAGNOSIS — N951 Menopausal and female climacteric states: Secondary | ICD-10-CM | POA: Diagnosis not present

## 2020-08-26 ENCOUNTER — Other Ambulatory Visit: Payer: Self-pay | Admitting: Psychiatry

## 2020-08-26 DIAGNOSIS — F411 Generalized anxiety disorder: Secondary | ICD-10-CM

## 2020-08-26 DIAGNOSIS — F5101 Primary insomnia: Secondary | ICD-10-CM

## 2020-08-27 NOTE — Telephone Encounter (Signed)
Apt 09/22/20

## 2020-09-02 DIAGNOSIS — Z6827 Body mass index (BMI) 27.0-27.9, adult: Secondary | ICD-10-CM | POA: Diagnosis not present

## 2020-09-02 DIAGNOSIS — Z01419 Encounter for gynecological examination (general) (routine) without abnormal findings: Secondary | ICD-10-CM | POA: Diagnosis not present

## 2020-09-03 ENCOUNTER — Ambulatory Visit (INDEPENDENT_AMBULATORY_CARE_PROVIDER_SITE_OTHER): Payer: PPO | Admitting: Ophthalmology

## 2020-09-03 ENCOUNTER — Other Ambulatory Visit: Payer: Self-pay

## 2020-09-03 ENCOUNTER — Ambulatory Visit: Payer: PPO | Admitting: Psychiatry

## 2020-09-03 ENCOUNTER — Encounter (INDEPENDENT_AMBULATORY_CARE_PROVIDER_SITE_OTHER): Payer: Self-pay | Admitting: Ophthalmology

## 2020-09-03 DIAGNOSIS — H353211 Exudative age-related macular degeneration, right eye, with active choroidal neovascularization: Secondary | ICD-10-CM | POA: Diagnosis not present

## 2020-09-03 DIAGNOSIS — H353221 Exudative age-related macular degeneration, left eye, with active choroidal neovascularization: Secondary | ICD-10-CM

## 2020-09-03 MED ORDER — AFLIBERCEPT 2MG/0.05ML IZ SOLN FOR KALEIDOSCOPE
2.0000 mg | INTRAVITREAL | Status: AC | PRN
  Administered 2020-09-03: 2 mg via INTRAVITREAL

## 2020-09-03 NOTE — Assessment & Plan Note (Signed)
Pigment epithelial detachments, now serous 1 which with the split PED, RPE sign, temporally.  Overall much improved compared to November 2020.  Repeat injection Eylea OD today and examination in 8 weeks

## 2020-09-03 NOTE — Progress Notes (Addendum)
09/03/2020     CHIEF COMPLAINT Patient presents for Retina Follow Up   HISTORY OF PRESENT ILLNESS: Angie Liu is a 74 y.o. female who presents to the clinic today for:   HPI    Retina Follow Up    Patient presents with  Wet AMD.  In right eye.  This started 6 weeks ago.  Severity is mild.  Duration of 6 weeks.  Since onset it is stable.          Comments    6 Week AMD F/U OD, poss Eylea OD  Pt denies noticeable changes to [place] OU since last visit. Pt denies ocular pain, flashes of light, or floaters OU.         Last edited by Rockie Neighbours, [place] on 09/03/2020  9:40 AM. (History)      Referring physician: Hayden Rasmussen, MD [place],  [place] [id number]  HISTORICAL INFORMATION:   Selected notes from the MEDICAL RECORD NUMBER       CURRENT MEDICATIONS: No current outpatient medications on file. (Ophthalmic Drugs)   No current facility-administered medications for this visit. (Ophthalmic Drugs)   Current Outpatient Medications (Other)  Medication Sig  . ALPRAZolam (XANAX) 0.5 MG tablet take one tablet by mouth at bedtime and one tablet by mouth every day as needed for anxiety  . chlorpheniramine (CHLOR-TRIMETON) 4 MG tablet Take 4 mg by mouth 2 (two) times daily as needed for allergies.   . Cholecalciferol (VITAMIN D3) 1000 units CAPS Take by mouth.  . doxepin (SINEQUAN) 75 MG capsule Take 1 capsule (75 mg total) by mouth at bedtime.  . fluocinonide (LIDEX) 0.05 % external solution fluocinonide 0.05 % topical solution  . FOLIC ACID PO Take by mouth.  . melatonin 3 MG TABS tablet Take 3 mg by mouth at bedtime.  . Multiple Vitamins-Minerals (PRESERVISION AREDS PO) Take 1 tablet by mouth 2 (two) times daily.  . mupirocin ointment (BACTROBAN) 2 % mupirocin 2 % topical ointment  . OVER THE COUNTER MEDICATION 4 capsules daily. Calm CP, takes 1 breakfast, 1 at dinner and 2 at bedtime  . OVER THE COUNTER MEDICATION 1 capsule at bedtime. Kavinace  capusle  . OVER THE COUNTER MEDICATION Place 1 tablet under the tongue every morning. [place]  . OVER THE COUNTER MEDICATION Place 1 tablet under the tongue. Butyra  . OVER THE COUNTER MEDICATION Alpha gabba  . Progesterone Micronized (PROGESTERONE PO) Take 2 capsules by mouth at bedtime. 1:1 SR compound medication at [facility].  . THEANINE PO Take by mouth.  Marland Kitchen UNABLE TO FIND every 30 (thirty) days. Formula A16B  . UNABLE TO FIND Sarspa  . zolpidem (AMBIEN CR) 12.5 MG CR tablet Take 1 tablet (12.5 mg total) by mouth at bedtime.   No current facility-administered medications for this visit. (Other)      REVIEW OF SYSTEMS:    ALLERGIES Allergies  Allergen Reactions  . Latex Rash    PAST MEDICAL HISTORY Past Medical History:  Diagnosis Date  . Anxiety   . Depression   . Hayfever    Past Surgical History:  Procedure Laterality Date  . CATARACT EXTRACTION W/PHACO Bilateral 2018  . TONSILLECTOMY      FAMILY HISTORY Family History  Problem Relation Age of Onset  . Cancer Mother        colon  . Depression Mother   . Diabetes Father   . Depression Father   . Depression Son   .  Depression Paternal Uncle     SOCIAL HISTORY Social History   Tobacco Use  . Smoking status: Never Smoker  . Smokeless tobacco: Never Used  Substance Use Topics  . Alcohol use: Yes  . Drug use: No         OPHTHALMIC EXAM:  Base Eye Exam    Visual Acuity (ETDRS)      Right Left   Dist cc 20/30 -1 20/25 -2   Dist ph cc 20/30 +2        Tonometry (Tonopen, 9:40 AM)      Right Left   Pressure 12 14       Pupils      Pupils Dark Light Shape React APD   Right PERRL 4 3 Round Brisk None   Left PERRL 4 3 Round Brisk None       Visual Fields (Counting fingers)      Left Right    Full Full       Extraocular Movement      Right Left    Full Full       Neuro/Psych    Oriented x3: Yes   Mood/Affect: Normal       Dilation    Right eye: 1.0% Mydriacyl, 2.5% Phenylephrine  @ 9:44 AM        Slit Lamp and Fundus Exam    External Exam      Right Left   External Normal Normal       Slit Lamp Exam      Right Left   Lids/Lashes Normal Normal   Conjunctiva/Sclera White and quiet White and quiet   Cornea Clear Clear   Anterior Chamber Deep and quiet Deep and quiet   Iris Round and reactive Round and reactive   Lens Posterior chamber intraocular lens Posterior chamber intraocular lens   Anterior Vitreous Normal Normal          IMAGING AND PROCEDURES  Imaging and Procedures for 09/03/20  OCT, Retina - OU - Both Eyes       Right Eye Quality was good. Scan locations included subfoveal. Central Foveal Thickness: 253. Progression has improved. Findings include pigment epithelial detachment.   Left Eye Quality was good. Scan locations included subfoveal. Central Foveal Thickness: 250. Progression has improved. Findings include no SRF.   Notes OD with smaller serous pigment epithelial detachments, no longer solid drusenoid as was apparent November 2020.  Split PED sign temporally, suggest that this is in an active lesion.  We will treat again today at 6-week follow-up and extend interval examination now to 8 weeks.  OS small subretinal fluid has resolved, follow-up as scheduled       Intravitreal Injection, Pharmacologic Agent - OD - Right Eye       Time Out 09/03/2020. 10:57 AM. Confirmed correct patient, procedure, site, and patient consented.   Anesthesia Topical anesthesia was used. Anesthetic medications included Akten 3.5%.   Procedure Preparation included Ofloxacin , Tobramycin 0.3%, 10% betadine to eyelids, 5% betadine to ocular surface. A 30 gauge needle was used.   Injection:  2 mg aflibercept Alfonse Flavors) SOLN   NDC: [id number], Lot: [id number]   Route: Intravitreal, Site: Right Eye, Waste: 0 mg  Post-op Post injection exam found visual acuity of at least counting fingers. The patient tolerated the procedure well. There were no  complications. The patient received written and verbal post procedure care education. Post injection medications were not given.  ASSESSMENT/PLAN:  Exudative age-related macular degeneration of right eye with active choroidal neovascularization (HCC) Pigment epithelial detachments, now serous 1 which with the split PED, RPE sign, temporally.  Overall much improved compared to November 2020.  Repeat injection Eylea OD today and examination in 8 weeks  Exudative age-related macular degeneration of left eye with active choroidal neovascularization ([place]) Dilate OS next as scheduled, much less subretinal fluid on OCT today      ICD-10-CM   1. Exudative age-related macular degeneration of right eye with active choroidal neovascularization (HCC)  H35.3211 OCT, Retina - OU - Both Eyes    Intravitreal Injection, Pharmacologic Agent - OD - Right Eye    aflibercept (EYLEA) SOLN 2 mg  2. Exudative age-related macular degeneration of left eye with active choroidal neovascularization ([place])  H35.3221     1.  Injection intravitreal Eylea OD today, and extend interval to 8-week follow-up examination OD  2.  Dilate OS next as scheduled  3.  Ophthalmic Meds Ordered this visit:  Meds ordered this encounter  Medications  . aflibercept (EYLEA) SOLN 2 mg       Return in about 8 weeks (around 10/29/2020) for dilate, OD, EYLEA OCT.  Patient Instructions  Patient instructed to contact the office promptly for new onset visual acuity distortions or declines    Explained the diagnoses, plan, and follow up with the patient and they expressed understanding.  Patient expressed understanding of the importance of proper follow up care.   Clent Demark Mariaclara Spear M.D. Diseases & Surgery of the Retina and Vitreous Retina & Diabetic [place] 09/03/20     Abbreviations: M myopia (nearsighted); A astigmatism; H hyperopia (farsighted); P presbyopia; Mrx spectacle prescription;  CTL contact  lenses; OD right eye; OS left eye; OU both eyes  XT exotropia; ET esotropia; PEK punctate epithelial keratitis; PEE punctate epithelial erosions; DES dry eye syndrome; MGD meibomian gland dysfunction; ATs artificial tears; PFAT's preservative free artificial tears; [place] nuclear sclerotic cataract; PSC posterior subcapsular cataract; ERM epi-retinal membrane; PVD posterior vitreous detachment; RD retinal detachment; DM diabetes mellitus; DR diabetic retinopathy; NPDR non-proliferative diabetic retinopathy; PDR proliferative diabetic retinopathy; CSME clinically significant macular edema; DME diabetic macular edema; dbh dot blot hemorrhages; CWS cotton wool spot; POAG primary open angle glaucoma; C/D cup-to-disc ratio; HVF humphrey visual field; GVF goldmann visual field; OCT optical coherence tomography; IOP intraocular pressure; BRVO Branch retinal vein occlusion; CRVO central retinal vein occlusion; CRAO central retinal artery occlusion; BRAO branch retinal artery occlusion; RT retinal tear; SB scleral buckle; PPV pars plana vitrectomy; VH Vitreous hemorrhage; PRP panretinal laser photocoagulation; IVK intravitreal kenalog; VMT vitreomacular traction; MH Macular hole;  NVD neovascularization of the disc; NVE neovascularization elsewhere; AREDS age related eye disease study; ARMD age related macular degeneration; POAG primary open angle glaucoma; EBMD epithelial/anterior basement membrane dystrophy; ACIOL anterior chamber intraocular lens; IOL intraocular lens; PCIOL posterior chamber intraocular lens; Phaco/IOL phacoemulsification with intraocular lens placement; [place] photorefractive keratectomy; LASIK laser assisted in situ keratomileusis; HTN hypertension; DM diabetes mellitus; COPD chronic obstructive pulmonary disease

## 2020-09-03 NOTE — Patient Instructions (Signed)
Patient instructed to contact the office promptly for new onset visual acuity distortions or declines

## 2020-09-03 NOTE — Assessment & Plan Note (Signed)
Dilate OS next as scheduled, much less subretinal fluid on OCT today

## 2020-09-07 ENCOUNTER — Other Ambulatory Visit: Payer: Self-pay | Admitting: Psychiatry

## 2020-09-07 DIAGNOSIS — F5101 Primary insomnia: Secondary | ICD-10-CM

## 2020-09-08 NOTE — Telephone Encounter (Signed)
Next apt 09/22/20

## 2020-09-17 ENCOUNTER — Ambulatory Visit: Payer: PPO | Admitting: Psychiatry

## 2020-09-22 ENCOUNTER — Encounter: Payer: Self-pay | Admitting: Psychiatry

## 2020-09-22 ENCOUNTER — Ambulatory Visit (INDEPENDENT_AMBULATORY_CARE_PROVIDER_SITE_OTHER): Payer: PPO | Admitting: Psychiatry

## 2020-09-22 ENCOUNTER — Other Ambulatory Visit: Payer: Self-pay

## 2020-09-22 DIAGNOSIS — F5101 Primary insomnia: Secondary | ICD-10-CM | POA: Diagnosis not present

## 2020-09-22 DIAGNOSIS — F411 Generalized anxiety disorder: Secondary | ICD-10-CM | POA: Diagnosis not present

## 2020-09-22 DIAGNOSIS — F331 Major depressive disorder, recurrent, moderate: Secondary | ICD-10-CM | POA: Diagnosis not present

## 2020-09-22 DIAGNOSIS — N951 Menopausal and female climacteric states: Secondary | ICD-10-CM | POA: Diagnosis not present

## 2020-09-22 MED ORDER — DOXEPIN HCL 100 MG PO CAPS: 100.0000 mg | ORAL_CAPSULE | Freq: Every day | ORAL | 1 refills | Status: DC

## 2020-09-22 NOTE — Progress Notes (Signed)
Angie Liu [id number] 1946-06-15 74 y.o.  Subjective:   Patient ID:  Angie Liu is a 74 y.o. (DOB Apr 25, 1946) female.  Chief Complaint:  Chief Complaint  Patient presents with   Anxiety   Depression   Insomnia    HPI Angie Liu presents to the office today for follow-up of insomnia, depression, and anxiety. She reports that she is "sometimes ok and sometimes not." She reports that she is no longer sleeping through the night since decreasing Doxepin from 100 mg to 75 mg po QHS.  Has been having to take an additional Xanax during the middle of the night. She also tried not to take Progesterone due to cost and experienced further worsening in insomnia. Has also noticed worsening anxiety and depression recently.   She reports feeling depressed when she first wakes up. She reports that her mood improves some when she talks with her husband and has some coffee. "Life just seems so much more complicated." She reports that she and her husband recently traveled to his HS reunion and to visit family and reports that this was "too stressful." She reports that she has anxiety when riding in the car, particularly when going through traffic. She reports worry about husband possibly needing to travel [place] for a funeral. She reports anxiety with driving to a shopping center and will park and re-park her car until she feels it is "just right." Energy and motivation are low in the morning. She reports that she is no longer interested in cooking. "I don't like food, but I eat plenty." She reports that she is able to concentrate on things if she is interested in. Has lost interest in playing piano and is "not even interested in listening to music right now." Denies SI.   Has been exercising 5 days a week.   She reports that tinnitus is worsening.   Reports that son, daughter, and grandchildren are doing well. Son is living in [place].   PHQ2-9     Office Visit from 03/06/2015 in Primary Care at [facility] Total Score 0       Review of Systems:  Review of Systems  HENT: Positive for tinnitus.   Eyes: Positive for visual disturbance.  Gastrointestinal: Negative.   Musculoskeletal: Negative for gait problem.  Neurological: Negative for tremors and headaches.  Psychiatric/Behavioral:       Please refer to HPI    Medications: I have reviewed the patient's current medications.  Current Outpatient Medications  Medication Sig Dispense Refill   ALPRAZolam (XANAX) 0.5 MG tablet take one tablet by mouth at bedtime and one tablet by mouth every day as needed for anxiety 180 tablet 0   chlorpheniramine (CHLOR-TRIMETON) 4 MG tablet Take 4 mg by mouth 2 (two) times daily as needed for allergies.      Cholecalciferol (VITAMIN D3) 1000 units CAPS Take by mouth.     FOLIC ACID PO Take by mouth.     melatonin 3 MG TABS tablet Take 3 mg by mouth at bedtime.     Multiple Vitamins-Minerals (PRESERVISION AREDS PO) Take 1 tablet by mouth 2 (two) times daily.     OVER THE COUNTER MEDICATION 4 capsules daily. Calm CP, takes 1 breakfast, 1 at dinner and 2 at bedtime     OVER THE COUNTER MEDICATION 1 capsule at bedtime. Kavinace capusle     OVER THE COUNTER MEDICATION Place 1 tablet under the tongue every morning. Seneca     OVER THE COUNTER  MEDICATION Place 1 tablet under the tongue. Butyra     OVER THE COUNTER MEDICATION Alpha gabba     Progesterone Micronized (PROGESTERONE PO) Take 2 capsules by mouth at bedtime. 1:1 SR compound medication at [place].     THEANINE PO Take by mouth.     UNABLE TO FIND every 30 (thirty) days. Formula A16B     UNABLE TO FIND Sarspa     zolpidem (AMBIEN CR) 12.5 MG CR tablet TAKE ONE TABLET BY MOUTH DAILY AT BEDTIME 90 tablet 0   doxepin (SINEQUAN) 100 MG capsule Take 1 capsule (100 mg total) by mouth at bedtime. 90 capsule 1   fluocinonide (LIDEX) 0.05 % external solution fluocinonide 0.05 % topical solution     mupirocin ointment (BACTROBAN) 2 %  mupirocin 2 % topical ointment     No current facility-administered medications for this visit.    Medication Side Effects: None  Allergies:  Allergies  Allergen Reactions   Latex Rash    Past Medical History:  Diagnosis Date   Anxiety    Depression    Hayfever     Family History  Problem Relation Age of Onset   Cancer Mother        colon   Depression Mother    Diabetes Father    Depression Father    Depression Son    Depression Paternal Uncle     Social History   Socioeconomic History   Marital status: Married    Spouse name: Not on file   Number of children: Not on file   Years of education: Not on file   Highest education level: Not on file  Occupational History   Not on file  Tobacco Use   Smoking status: Never Smoker   Smokeless tobacco: Never Used  Substance and Sexual Activity   Alcohol use: Yes   Drug use: No   Sexual activity: Not on file  Other Topics Concern   Not on file  Social History Narrative   Not on file   Social Determinants of Health   Financial Resource Strain:    Difficulty of Paying Living Expenses: Not on file  Food Insecurity:    Worried About [place] in the Last Year: Not on file   Ran Out of Food in the Last Year: Not on file  Transportation Needs:    Lack of Transportation (Medical): Not on file   Lack of Transportation (Non-Medical): Not on file  Physical Activity:    Days of Exercise per Week: Not on file   Minutes of Exercise per Session: Not on file  Stress:    Feeling of Stress : Not on file  Social Connections:    Frequency of Communication with Friends and Family: Not on file   Frequency of Social Gatherings with Friends and Family: Not on file   Attends Religious Services: Not on file   Active Member of Clubs or Organizations: Not on file   Attends Archivist Meetings: Not on file   Marital Status: Not on file  Intimate Partner Violence:    Fear of  Current or Ex-Partner: Not on file   Emotionally Abused: Not on file   Physically Abused: Not on file   Sexually Abused: Not on file    Past Medical History, Surgical history, Social history, and Family history were reviewed and updated as appropriate.   Please see review of systems for further details on the patient's review from today.   Objective:  Physical Exam:  There were no vitals taken for this visit.  Physical Exam Constitutional:      General: She is not in acute distress. Musculoskeletal:        General: No deformity.  Neurological:     Mental Status: She is alert and oriented to person, place, and time.     Coordination: Coordination normal.  Psychiatric:        Attention and Perception: Attention and perception normal. She does not perceive auditory or visual hallucinations.        Mood and Affect: Mood is anxious and depressed. Affect is not labile, blunt, angry or inappropriate.        Speech: Speech normal.        Behavior: Behavior normal.        Thought Content: Thought content normal. Thought content is not paranoid or delusional. Thought content does not include homicidal or suicidal ideation. Thought content does not include homicidal or suicidal plan.        Cognition and Memory: Cognition and memory normal.        Judgment: Judgment normal.     Comments: Insight intact     Lab Review:  No results found for: NA, K, CL, CO2, GLUCOSE, BUN, CREATININE, CALCIUM, PROT, ALBUMIN, AST, ALT, ALKPHOS, BILITOT, GFRNONAA, GFRAA  No results found for: WBC, RBC, HGB, HCT, PLT, MCV, MCH, MCHC, RDW, LYMPHSABS, MONOABS, EOSABS, BASOSABS  No results found for: POCLITH, LITHIUM   No results found for: PHENYTOIN, PHENOBARB, VALPROATE, CBMZ   .res Assessment: Plan:   Will increase Doxepin to 100 mg po QHS to improve anxiety, depression, and insomnia since these s/s have worsened with dose decrease. Continue Xanax 0.5 mg po BID prn anxiety and insomnia.  Continues  Ambien CR 12.5 mg po QHS prn insomnia.  Pt to follow-up in 2-3 months or sooner if clinically indicated.  Patient advised to contact office with any questions, adverse effects, or acute worsening in signs and symptoms.   Genora was seen today for anxiety, depression and insomnia.  Diagnoses and all orders for this visit:  Primary insomnia -     doxepin (SINEQUAN) 100 MG capsule; Take 1 capsule (100 mg total) by mouth at bedtime.  Moderate recurrent major depression (HCC) -     doxepin (SINEQUAN) 100 MG capsule; Take 1 capsule (100 mg total) by mouth at bedtime.  Generalized anxiety disorder     Please see After Visit Summary for patient specific instructions.  Future Appointments  Date Time Provider [place]  10/08/2020 10:45 AM Rankin, Clent Demark, MD RDE-RDE None  10/29/2020 10:30 AM Rankin, Clent Demark, MD RDE-RDE None  12/23/2020 11:00 AM Thayer Headings, PMHNP CP-CP None    No orders of the defined types were placed in this encounter.   -------------------------------

## 2020-10-08 ENCOUNTER — Ambulatory Visit (INDEPENDENT_AMBULATORY_CARE_PROVIDER_SITE_OTHER): Payer: PPO | Admitting: Ophthalmology

## 2020-10-08 ENCOUNTER — Other Ambulatory Visit: Payer: Self-pay

## 2020-10-08 ENCOUNTER — Encounter (INDEPENDENT_AMBULATORY_CARE_PROVIDER_SITE_OTHER): Payer: Self-pay | Admitting: Ophthalmology

## 2020-10-08 DIAGNOSIS — H35721 Serous detachment of retinal pigment epithelium, right eye: Secondary | ICD-10-CM

## 2020-10-08 DIAGNOSIS — H35722 Serous detachment of retinal pigment epithelium, left eye: Secondary | ICD-10-CM

## 2020-10-08 DIAGNOSIS — H353112 Nonexudative age-related macular degeneration, right eye, intermediate dry stage: Secondary | ICD-10-CM | POA: Diagnosis not present

## 2020-10-08 DIAGNOSIS — H353221 Exudative age-related macular degeneration, left eye, with active choroidal neovascularization: Secondary | ICD-10-CM

## 2020-10-08 MED ORDER — AFLIBERCEPT 2MG/0.05ML IZ SOLN FOR KALEIDOSCOPE
2.0000 mg | INTRAVITREAL | Status: AC | PRN
  Administered 2020-10-08: 2 mg via INTRAVITREAL

## 2020-10-08 NOTE — Progress Notes (Signed)
10/08/2020     CHIEF COMPLAINT Patient presents for Retina Follow Up   HISTORY OF PRESENT ILLNESS: Angie Liu is a 74 y.o. female who presents to the clinic today for:   HPI    Retina Follow Up    Patient presents with  Wet AMD.  In left eye.  Severity is moderate.  Duration of 10 weeks.  Since onset it is stable.  I, the attending physician,  performed the HPI with the patient and updated documentation appropriately.          Comments    10 Week Wet AMD f\u OS. Possible Eylea  OS. OCT  Pt states she has a harder time reading small print.        Last edited by Tilda Franco on 10/08/2020 11:05 AM. (History)      Referring physician: Hayden Rasmussen, MD [address],  [place] [id number]  HISTORICAL INFORMATION:   Selected notes from the MEDICAL RECORD NUMBER       CURRENT MEDICATIONS: No current outpatient medications on file. (Ophthalmic Drugs)   No current facility-administered medications for this visit. (Ophthalmic Drugs)   Current Outpatient Medications (Other)  Medication Sig  . ALPRAZolam (XANAX) 0.5 MG tablet take one tablet by mouth at bedtime and one tablet by mouth every day as needed for anxiety  . chlorpheniramine (CHLOR-TRIMETON) 4 MG tablet Take 4 mg by mouth 2 (two) times daily as needed for allergies.   . Cholecalciferol (VITAMIN D3) 1000 units CAPS Take by mouth.  . doxepin (SINEQUAN) 100 MG capsule Take 1 capsule (100 mg total) by mouth at bedtime.  . fluocinonide (LIDEX) 0.05 % external solution fluocinonide 0.05 % topical solution  . FOLIC ACID PO Take by mouth.  . melatonin 3 MG TABS tablet Take 3 mg by mouth at bedtime.  . Multiple Vitamins-Minerals (PRESERVISION AREDS PO) Take 1 tablet by mouth 2 (two) times daily.  . mupirocin ointment (BACTROBAN) 2 % mupirocin 2 % topical ointment  . OVER THE COUNTER MEDICATION 4 capsules daily. Calm CP, takes 1 breakfast, 1 at dinner and 2 at bedtime  . OVER THE COUNTER  MEDICATION 1 capsule at bedtime. Kavinace capusle  . OVER THE COUNTER MEDICATION Place 1 tablet under the tongue every morning. Seneca  . OVER THE COUNTER MEDICATION Place 1 tablet under the tongue. Butyra  . OVER THE COUNTER MEDICATION Alpha gabba  . Progesterone Micronized (PROGESTERONE PO) Take 2 capsules by mouth at bedtime. 1:1 SR compound medication at [place].  . THEANINE PO Take by mouth.  Marland Kitchen UNABLE TO FIND every 30 (thirty) days. Formula A16B  . UNABLE TO FIND Sarspa  . zolpidem (AMBIEN CR) 12.5 MG CR tablet TAKE ONE TABLET BY MOUTH DAILY AT BEDTIME   No current facility-administered medications for this visit. (Other)      REVIEW OF SYSTEMS:    ALLERGIES Allergies  Allergen Reactions  . Latex Rash    PAST MEDICAL HISTORY Past Medical History:  Diagnosis Date  . Anxiety   . Depression   . Hayfever    Past Surgical History:  Procedure Laterality Date  . CATARACT EXTRACTION W/PHACO Bilateral 2018  . TONSILLECTOMY      FAMILY HISTORY Family History  Problem Relation Age of Onset  . Cancer Mother        colon  . Depression Mother   . Diabetes Father   . Depression Father   . Depression Son   .  Depression Paternal Uncle     SOCIAL HISTORY Social History   Tobacco Use  . Smoking status: Never Smoker  . Smokeless tobacco: Never Used  Substance Use Topics  . Alcohol use: Yes  . Drug use: No         OPHTHALMIC EXAM:  Base Eye Exam    Visual Acuity (Snellen - Linear)      Right Left   Dist cc 20/25 20/25   Correction: Glasses       Tonometry (Tonopen, 11:10 AM)      Right Left   Pressure 8 8       Pupils      Pupils Dark Light Shape React APD   Right PERRL 4 3 Round Brisk None   Left PERRL 4 3 Round Brisk None       Visual Fields (Counting fingers)      Left Right    Full Full       Neuro/Psych    Oriented x3: Yes   Mood/Affect: Normal       Dilation    Left eye: 1.0% Mydriacyl, 2.5% Phenylephrine @ 11:10 AM         Slit Lamp and Fundus Exam    External Exam      Right Left   External Normal Normal       Slit Lamp Exam      Right Left   Lids/Lashes Normal Normal   Conjunctiva/Sclera White and quiet White and quiet   Cornea Clear Clear   Anterior Chamber Deep and quiet Deep and quiet   Iris Round and reactive Round and reactive   Lens Posterior chamber intraocular lens Posterior chamber intraocular lens   Anterior Vitreous Normal Normal       Fundus Exam      Right Left   Posterior Vitreous  Posterior vitreous detachment   Disc  Normal   C/D Ratio  0.6   Macula  Atrophy, Retinal pigment epithelial detachment, Drusen, no exudates, no hemorrhage, no macular thickening, Soft drusen, Intermediate age related macular degeneration   Vessels  Normal   Periphery  Normal          IMAGING AND PROCEDURES  Imaging and Procedures for 10/08/20  OCT, Retina - OU - Both Eyes       Right Eye Quality was good. Scan locations included subfoveal. Central Foveal Thickness: 256. Progression has improved. Findings include abnormal foveal contour, retinal drusen , pigment epithelial detachment.   Left Eye Quality was good. Scan locations included subfoveal. Central Foveal Thickness: 247. Progression has improved. Findings include abnormal foveal contour, retinal drusen , no IRF, pigment epithelial detachment.   Notes Much less subretinal fluid, pigment epithelial detachment remains, OS.  Actually improved anatomy OS as compared to 1 year previous.  OD subfoveal pigment epithelial detachments remain, no signs of CNVM.       Intravitreal Injection, Pharmacologic Agent - OS - Left Eye       Time Out 10/08/2020. 11:52 AM. Confirmed correct patient, procedure, site, and patient consented.   Anesthesia Topical anesthesia was used. Anesthetic medications included Akten 3.5%.   Procedure Preparation included 10% betadine to eyelids, 5% betadine to ocular surface, Tobramycin 0.3%. A 30 gauge needle  was used.   Injection:  2 mg aflibercept Alfonse Flavors) SOLN   NDC: [id number], Lot: [id number]   Route: Intravitreal, Site: Left Eye, Waste: 0 mg  Post-op Post injection exam found visual acuity of at least counting fingers. The patient tolerated the  procedure well. There were no complications. The patient received written and verbal post procedure care education. Post injection medications were not given.                 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  We will repeat injection today and extend interval examination to 3 months.  We will plan on dilating OU next      ICD-10-CM   1. Exudative age-related macular degeneration of left eye with active choroidal neovascularization (HCC)  H35.3221 OCT, Retina - OU - Both Eyes    Intravitreal Injection, Pharmacologic Agent - OS - Left Eye    aflibercept (EYLEA) SOLN 2 mg  2. Intermediate stage nonexudative age-related macular degeneration of right eye  H35.3112   3. 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 Serous detachment of retinal pigment epithelium of left eye  H35.722     1.  Dilate OU next visit  2.  Repeat injection intravitreal Eylea OS today, next visit likely injection  3.  Ophthalmic Meds Ordered this visit:  Meds ordered this encounter  Medications  . aflibercept (EYLEA) SOLN 2 mg       Return in about 3 months (around 01/06/2021) for DILATE OU, EYLEA OCT, OS.  There are no Patient Instructions on file for this  visit.   Explained the diagnoses, plan, and follow up with the patient and they expressed understanding.  Patient expressed understanding of the importance of proper follow up care.   Clent Demark Makaylee Spielberg M.D. Diseases & Surgery of the Retina and Vitreous Retina & Diabetic [place] 10/08/20     Abbreviations: M myopia (nearsighted); A astigmatism; H hyperopia (farsighted); P presbyopia; Mrx spectacle prescription;  CTL contact lenses; OD right eye; OS left eye; OU both eyes  XT exotropia; ET esotropia; PEK punctate epithelial keratitis; PEE punctate epithelial erosions; DES dry eye syndrome; MGD meibomian gland dysfunction; ATs artificial tears; PFAT's preservative free artificial tears; [place] nuclear sclerotic cataract; PSC posterior subcapsular cataract; ERM epi-retinal membrane; PVD posterior vitreous detachment; RD retinal detachment; DM diabetes mellitus; DR diabetic retinopathy; NPDR non-proliferative diabetic retinopathy; PDR proliferative diabetic retinopathy; CSME clinically significant macular edema; DME diabetic macular edema; dbh dot blot hemorrhages; CWS cotton wool spot; POAG primary open angle glaucoma; C/D cup-to-disc ratio; HVF humphrey visual field; GVF goldmann visual field; OCT optical coherence tomography; IOP intraocular pressure; BRVO Branch retinal vein occlusion; CRVO central retinal vein occlusion; CRAO central retinal artery occlusion; BRAO branch retinal artery occlusion; RT retinal tear; SB scleral buckle; PPV pars plana vitrectomy; VH Vitreous hemorrhage; PRP panretinal laser photocoagulation; IVK intravitreal kenalog; VMT vitreomacular traction; MH Macular hole;  NVD neovascularization of the disc; NVE neovascularization elsewhere; AREDS age related eye disease study; ARMD age related macular degeneration; POAG primary open angle glaucoma; EBMD epithelial/anterior basement membrane dystrophy; ACIOL anterior chamber intraocular lens; IOL intraocular lens; PCIOL posterior chamber  intraocular lens; Phaco/IOL phacoemulsification with intraocular lens placement; [place] photorefractive keratectomy; LASIK laser assisted in situ keratomileusis; HTN hypertension; DM diabetes mellitus; COPD chronic obstructive pulmonary disease

## 2020-10-08 NOTE — Assessment & Plan Note (Signed)
Part of wet ARMD OS, improved over the last 12 months

## 2020-10-08 NOTE — Assessment & Plan Note (Signed)
Currently at 10-week interval, improved today at 10 weeks on intravitreal Eylea.  We will repeat injection today and extend interval examination to 3 months.  We will plan on dilating OU next

## 2020-10-08 NOTE — Assessment & Plan Note (Signed)
Stable OD, no signs of CNVM

## 2020-10-08 NOTE — Assessment & Plan Note (Signed)
I risk for CNVM, but none seen today at

## 2020-10-14 DIAGNOSIS — H5010 Unspecified exotropia: Secondary | ICD-10-CM | POA: Diagnosis not present

## 2020-10-14 DIAGNOSIS — Z961 Presence of intraocular lens: Secondary | ICD-10-CM | POA: Diagnosis not present

## 2020-10-14 DIAGNOSIS — H52203 Unspecified astigmatism, bilateral: Secondary | ICD-10-CM | POA: Diagnosis not present

## 2020-10-14 DIAGNOSIS — D3132 Benign neoplasm of left choroid: Secondary | ICD-10-CM | POA: Diagnosis not present

## 2020-10-29 ENCOUNTER — Ambulatory Visit (INDEPENDENT_AMBULATORY_CARE_PROVIDER_SITE_OTHER): Payer: PPO | Admitting: Ophthalmology

## 2020-10-29 ENCOUNTER — Encounter (INDEPENDENT_AMBULATORY_CARE_PROVIDER_SITE_OTHER): Payer: Self-pay | Admitting: Ophthalmology

## 2020-10-29 ENCOUNTER — Other Ambulatory Visit: Payer: Self-pay

## 2020-10-29 DIAGNOSIS — H353221 Exudative age-related macular degeneration, left eye, with active choroidal neovascularization: Secondary | ICD-10-CM | POA: Diagnosis not present

## 2020-10-29 DIAGNOSIS — H353211 Exudative age-related macular degeneration, right eye, with active choroidal neovascularization: Secondary | ICD-10-CM

## 2020-10-29 MED ORDER — AFLIBERCEPT 2MG/0.05ML IZ SOLN FOR KALEIDOSCOPE
2.0000 mg | INTRAVITREAL | Status: AC | PRN
  Administered 2020-10-29: 2 mg via INTRAVITREAL

## 2020-10-29 NOTE — Assessment & Plan Note (Signed)
OD currently at 8-week follow-up interval, doing very well stabilized, pigment epithelial detachment remains.

## 2020-10-29 NOTE — Progress Notes (Signed)
10/29/2020     CHIEF COMPLAINT Patient presents for Retina Follow Up (8 Week Wet AMD f\u OD. Possible Eylea OD. OCT/Pt states vision is stable. Denies any changes.)   HISTORY OF PRESENT ILLNESS: Angie Liu is a 74 y.o. female who presents to the clinic today for:   HPI    Retina Follow Up    Patient presents with  Wet AMD.  In right eye.  Severity is moderate.  Duration of 8 weeks.  Since onset it is stable.  I, the attending physician,  performed the HPI with the patient and updated documentation appropriately. Additional comments: 8 Week Wet AMD f\u OD. Possible Eylea OD. OCT Pt states vision is stable. Denies any changes.       Last edited by Tilda Franco on 10/29/2020 10:53 AM. (History)      Referring physician: Hayden Rasmussen, MD [place],   [id number]  HISTORICAL INFORMATION:   Selected notes from the MEDICAL RECORD NUMBER       CURRENT MEDICATIONS: No current outpatient medications on file. (Ophthalmic Drugs)   No current facility-administered medications for this visit. (Ophthalmic Drugs)   Current Outpatient Medications (Other)  Medication Sig  . ALPRAZolam (XANAX) 0.5 MG tablet take one tablet by mouth at bedtime and one tablet by mouth every day as needed for anxiety  . chlorpheniramine (CHLOR-TRIMETON) 4 MG tablet Take 4 mg by mouth 2 (two) times daily as needed for allergies.   . Cholecalciferol (VITAMIN D3) 1000 units CAPS Take by mouth.  . doxepin (SINEQUAN) 100 MG capsule Take 1 capsule (100 mg total) by mouth at bedtime.  . fluocinonide (LIDEX) 0.05 % external solution fluocinonide 0.05 % topical solution  . FOLIC ACID PO Take by mouth.  . melatonin 3 MG TABS tablet Take 3 mg by mouth at bedtime.  . Multiple Vitamins-Minerals (PRESERVISION AREDS PO) Take 1 tablet by mouth 2 (two) times daily.  . mupirocin ointment (BACTROBAN) 2 % mupirocin 2 % topical ointment  . OVER THE COUNTER MEDICATION 4 capsules daily.  Calm CP, takes 1 breakfast, 1 at dinner and 2 at bedtime  . OVER THE COUNTER MEDICATION 1 capsule at bedtime. Kavinace capusle  . OVER THE COUNTER MEDICATION Place 1 tablet under the tongue every morning. Seneca  . OVER THE COUNTER MEDICATION Place 1 tablet under the tongue. Butyra  . OVER THE COUNTER MEDICATION Alpha gabba  . Progesterone Micronized (PROGESTERONE PO) Take 2 capsules by mouth at bedtime. 1:1 SR compound medication at [facility].  . THEANINE PO Take by mouth.  Marland Kitchen UNABLE TO FIND every 30 (thirty) days. Formula A16B  . UNABLE TO FIND Sarspa  . zolpidem (AMBIEN CR) 12.5 MG CR tablet TAKE ONE TABLET BY MOUTH DAILY AT BEDTIME   No current facility-administered medications for this visit. (Other)      REVIEW OF SYSTEMS:    ALLERGIES Allergies  Allergen Reactions  . Latex Rash    PAST MEDICAL HISTORY Past Medical History:  Diagnosis Date  . Anxiety   . Depression   . Hayfever    Past Surgical History:  Procedure Laterality Date  . CATARACT EXTRACTION W/PHACO Bilateral 2018  . TONSILLECTOMY      FAMILY HISTORY Family History  Problem Relation Age of Onset  . Cancer Mother        colon  . Depression Mother   . Diabetes Father   . Depression Father   . Depression Son   .  Depression Paternal Uncle     SOCIAL HISTORY Social History   Tobacco Use  . Smoking status: Never Smoker  . Smokeless tobacco: Never Used  Substance Use Topics  . Alcohol use: Yes  . Drug use: No         OPHTHALMIC EXAM:  Base Eye Exam    Visual Acuity (Snellen - Linear)      Right Left   Dist cc 20/30 + 20/30 +   Correction: Glasses       Tonometry (Tonopen, 10:57 AM)      Right Left   Pressure 10 11       Pupils      Pupils Dark Light Shape React APD   Right PERRL 4 3 Round Brisk None   Left PERRL 4 3 Round Brisk None       Visual Fields (Counting fingers)      Left Right    Full Full       Neuro/Psych    Oriented x3: Yes   Mood/Affect: Normal        Dilation    Right eye: 1.0% Mydriacyl, 2.5% Phenylephrine @ 10:57 AM        Slit Lamp and Fundus Exam    External Exam      Right Left   External Normal Normal       Slit Lamp Exam      Right Left   Lids/Lashes Normal Normal   Conjunctiva/Sclera White and quiet White and quiet   Cornea Clear Clear   Anterior Chamber Deep and quiet Deep and quiet   Iris Round and reactive Round and reactive   Lens Posterior chamber intraocular lens Posterior chamber intraocular lens   Anterior Vitreous Normal Normal       Fundus Exam      Right Left   Posterior Vitreous Posterior vitreous detachment    Disc Normal    C/D Ratio 0.5    Macula no hemorrhage, no exudates    Vessels Normal    Periphery Normal           IMAGING AND PROCEDURES  Imaging and Procedures for 10/29/20  OCT, Retina - OU - Both Eyes       Right Eye Quality was good. Scan locations included subfoveal. Central Foveal Thickness: 246. Progression has been stable. Findings include pigment epithelial detachment, retinal drusen , no SRF, no IRF.   Left Eye Quality was good. Scan locations included subfoveal. Central Foveal Thickness: 243. Progression has improved. Findings include no IRF, no SRF.   Notes OS follow-up in 3 weeks, no signs of CNVM active post intravitreal injection antivegF  OD at 8-week follow-up today post injection Eylea, pigment epithelial detachment stable, no intra or subretinal fluid , Maintain 8-week follow-up at this time       Intravitreal Injection, Pharmacologic Agent - OD - Right Eye       Time Out 10/29/2020. 11:52 AM. Confirmed correct patient, procedure, site, and patient consented.   Anesthesia Topical anesthesia was used. Anesthetic medications included Akten 3.5%.   Procedure Preparation included Ofloxacin , Tobramycin 0.3%, 10% betadine to eyelids, 5% betadine to ocular surface. A 30 gauge needle was used.   Injection:  2 mg aflibercept Gretta Cool) SOLN   NDC:  [id number], Lot: [id number]   Route: Intravitreal, Site: Right Eye, Waste: 0 mg  Post-op Post injection exam found visual acuity of at least counting fingers. The patient tolerated the procedure well. There were no complications. The patient received  written and verbal post procedure care education. Post injection medications were not given.                 ASSESSMENT/PLAN:  Exudative age-related macular degeneration of left eye with active choroidal neovascularization (HCC) At 3-week follow-up interval, condition stable and improving  Exudative age-related macular degeneration of right eye with active choroidal neovascularization (HCC) OD currently at 8-week follow-up interval, doing very well stabilized, pigment epithelial detachment remains.      ICD-10-CM   1. Exudative age-related macular degeneration of right eye with active choroidal neovascularization (HCC)  H35.3211 OCT, Retina - OU - Both Eyes    Intravitreal Injection, Pharmacologic Agent - OD - Right Eye    aflibercept (EYLEA) SOLN 2 mg  2. Exudative age-related macular degeneration of left eye with active choroidal neovascularization (HCC)  H35.3221     1.  Repeat intravitreal Eylea OD today as 8-week interval  Repeat examination again in 8 weeks  2.  Dilate OS next as scheduled 3.  Ophthalmic Meds Ordered this visit:  Meds ordered this encounter  Medications  . aflibercept (EYLEA) SOLN 2 mg       Return in about 8 weeks (around 12/24/2020) for dilate, OD, EYLEA OCT.  There are no Patient Instructions on file for this visit.   Explained the diagnoses, plan, and follow up with the patient and they expressed understanding.  Patient expressed understanding of the importance of proper follow up care.   Alford Highland Akshaya Toepfer M.D. Diseases & Surgery of the Retina and Vitreous [facility] 10/29/20     Abbreviations: M myopia (nearsighted); A astigmatism; H hyperopia (farsighted); P  presbyopia; Mrx spectacle prescription;  CTL contact lenses; OD right eye; OS left eye; OU both eyes  XT exotropia; ET esotropia; PEK punctate epithelial keratitis; PEE punctate epithelial erosions; DES dry eye syndrome; MGD meibomian gland dysfunction; ATs artificial tears; PFAT's preservative free artificial tears; NSC nuclear sclerotic cataract; PSC posterior subcapsular cataract; ERM epi-retinal membrane; PVD posterior vitreous detachment; RD retinal detachment; DM diabetes mellitus; DR diabetic retinopathy; NPDR non-proliferative diabetic retinopathy; PDR proliferative diabetic retinopathy; CSME clinically significant macular edema; DME diabetic macular edema; dbh dot blot hemorrhages; CWS cotton wool spot; POAG primary open angle glaucoma; C/D cup-to-disc ratio; HVF humphrey visual field; GVF goldmann visual field; OCT optical coherence tomography; IOP intraocular pressure; BRVO Branch retinal vein occlusion; CRVO central retinal vein occlusion; CRAO central retinal artery occlusion; BRAO branch retinal artery occlusion; RT retinal tear; SB scleral buckle; PPV pars plana vitrectomy; VH Vitreous hemorrhage; PRP panretinal laser photocoagulation; IVK intravitreal kenalog; VMT vitreomacular traction; MH Macular hole;  NVD neovascularization of the disc; NVE neovascularization elsewhere; AREDS age related eye disease study; ARMD age related macular degeneration; POAG primary open angle glaucoma; EBMD epithelial/anterior basement membrane dystrophy; ACIOL anterior chamber intraocular lens; IOL intraocular lens; PCIOL posterior chamber intraocular lens; Phaco/IOL phacoemulsification with intraocular lens placement; PRK photorefractive keratectomy; LASIK laser assisted in situ keratomileusis; HTN hypertension; DM diabetes mellitus; COPD chronic obstructive pulmonary disease

## 2020-10-29 NOTE — Assessment & Plan Note (Signed)
At 3-week follow-up interval, condition stable and improving

## 2020-11-05 DIAGNOSIS — L570 Actinic keratosis: Secondary | ICD-10-CM | POA: Diagnosis not present

## 2020-11-05 DIAGNOSIS — L814 Other melanin hyperpigmentation: Secondary | ICD-10-CM | POA: Diagnosis not present

## 2020-11-05 DIAGNOSIS — D225 Melanocytic nevi of trunk: Secondary | ICD-10-CM | POA: Diagnosis not present

## 2020-11-05 DIAGNOSIS — D2261 Melanocytic nevi of right upper limb, including shoulder: Secondary | ICD-10-CM | POA: Diagnosis not present

## 2020-11-05 DIAGNOSIS — Z85828 Personal history of other malignant neoplasm of skin: Secondary | ICD-10-CM | POA: Diagnosis not present

## 2020-11-05 DIAGNOSIS — D2262 Melanocytic nevi of left upper limb, including shoulder: Secondary | ICD-10-CM | POA: Diagnosis not present

## 2020-11-05 DIAGNOSIS — D1801 Hemangioma of skin and subcutaneous tissue: Secondary | ICD-10-CM | POA: Diagnosis not present

## 2020-11-05 DIAGNOSIS — D2272 Melanocytic nevi of left lower limb, including hip: Secondary | ICD-10-CM | POA: Diagnosis not present

## 2020-11-05 DIAGNOSIS — L308 Other specified dermatitis: Secondary | ICD-10-CM | POA: Diagnosis not present

## 2020-11-05 DIAGNOSIS — L821 Other seborrheic keratosis: Secondary | ICD-10-CM | POA: Diagnosis not present

## 2020-11-05 DIAGNOSIS — D2271 Melanocytic nevi of right lower limb, including hip: Secondary | ICD-10-CM | POA: Diagnosis not present

## 2020-12-02 ENCOUNTER — Other Ambulatory Visit: Payer: Self-pay | Admitting: Psychiatry

## 2020-12-02 DIAGNOSIS — F5101 Primary insomnia: Secondary | ICD-10-CM

## 2020-12-02 DIAGNOSIS — F411 Generalized anxiety disorder: Secondary | ICD-10-CM

## 2020-12-09 ENCOUNTER — Other Ambulatory Visit: Payer: Self-pay | Admitting: Psychiatry

## 2020-12-09 DIAGNOSIS — F5101 Primary insomnia: Secondary | ICD-10-CM

## 2020-12-23 ENCOUNTER — Encounter: Payer: Self-pay | Admitting: Psychiatry

## 2020-12-23 ENCOUNTER — Ambulatory Visit (INDEPENDENT_AMBULATORY_CARE_PROVIDER_SITE_OTHER): Payer: PPO | Admitting: Psychiatry

## 2020-12-23 ENCOUNTER — Other Ambulatory Visit: Payer: Self-pay

## 2020-12-23 DIAGNOSIS — F5101 Primary insomnia: Secondary | ICD-10-CM

## 2020-12-23 DIAGNOSIS — F331 Major depressive disorder, recurrent, moderate: Secondary | ICD-10-CM

## 2020-12-23 DIAGNOSIS — F411 Generalized anxiety disorder: Secondary | ICD-10-CM

## 2020-12-23 MED ORDER — ALPRAZOLAM 0.5 MG PO TABS: ORAL_TABLET | ORAL | 1 refills | Status: DC

## 2020-12-23 MED ORDER — DOXEPIN HCL 100 MG PO CAPS: 100.0000 mg | ORAL_CAPSULE | Freq: Every day | ORAL | 1 refills | Status: DC

## 2020-12-23 MED ORDER — ZOLPIDEM TARTRATE ER 12.5 MG PO TBCR: 12.5000 mg | EXTENDED_RELEASE_TABLET | Freq: Every day | ORAL | 1 refills | Status: DC

## 2020-12-23 NOTE — Progress Notes (Signed)
Angie Liu [id number] April 14, 1946 75 y.o.  Subjective:   Patient ID:  Angie Liu is a 75 y.o. (DOB 02-22-1946) female.  Chief Complaint:  Chief Complaint  Patient presents with  . Follow-up    Depression, Anxiety, and insomnia    HPI Angie Liu presents to the office today for follow-up of depression, anxiety, and insomnia. She reports that she "likes" increase in Doxepin. She reports, "most of the time I am fine." She reports that she started working with a trainer twice a week since January. She reports that this has been helpful for her mobility and strength. Energy has been lower at times, such as after funerals. She reports that she has periods of depression and has contemplated starting another antidepressant "but I don't really want to do that." She reports, "in general I feel good." She reports that she has been trying to eat healthier. She reports occasional nights of disrupted sleep and that most nights she sleeps well. She reports that her concentration has improved. Denies SI.  Has continued anxiety with riding in the car with her husband. She also has anxiety with driving.   She reports that she occasionally has difficulty expressing herself. She reports that occasionally she will internalize things and then blurt things out.   Has started playing piano again. She has met friends on a few occasions for a meal out. She has been watching TV before bedtime. Noticed she fell asleep faster when she has avoided blue light before bedtime.   Has had 3 funerals in 2 weeks. Nephew died.    [place] Office Visit from 03/06/2015 in Primary Care at [facility] Total Score 0       Review of Systems:  Review of Systems  Musculoskeletal: Negative for gait problem.  Skin:       Skin tear on hand and reports that she tripped and fell going up the stairs carrying multiple items after a funeral  Neurological:       She describes possible ataxia   Psychiatric/Behavioral:       Please refer to HPI     She reports that integrative medicine specialist did a saliva test and told her that she had abnormal cortisol levels and this could be contributing to sleep disturbance and depression.   Medications: I have reviewed the patient's current medications.  Current Outpatient Medications  Medication Sig Dispense Refill  . [START ON 03/03/2021] ALPRAZolam (XANAX) 0.5 MG tablet take one tablet by mouth at bedtime and one tablet by mouth every day as needed for anxiety 180 tablet 1  . chlorpheniramine (CHLOR-TRIMETON) 4 MG tablet Take 4 mg by mouth 2 (two) times daily as needed for allergies.     . Cholecalciferol (VITAMIN D3) 1000 units CAPS Take by mouth.    . doxepin (SINEQUAN) 100 MG capsule Take 1 capsule (100 mg total) by mouth at bedtime. 90 capsule 1  . fluocinonide (LIDEX) 0.05 % external solution fluocinonide 0.05 % topical solution    . FOLIC ACID PO Take by mouth.    . melatonin 3 MG TABS tablet Take 3 mg by mouth at bedtime.    . Multiple Vitamins-Minerals (PRESERVISION AREDS PO) Take 1 tablet by mouth 2 (two) times daily.    . mupirocin ointment (BACTROBAN) 2 % mupirocin 2 % topical ointment    . OVER THE COUNTER MEDICATION 4 capsules daily. Calm CP, takes 1 breakfast, 1 at dinner and 2 at bedtime    .  OVER THE COUNTER MEDICATION 1 capsule at bedtime. Kavinace capusle    . OVER THE COUNTER MEDICATION Place 1 tablet under the tongue every morning. Seneca    . OVER THE COUNTER MEDICATION Place 1 tablet under the tongue. Butyra    . OVER THE COUNTER MEDICATION Alpha gabba    . Progesterone Micronized (PROGESTERONE PO) Take 2 capsules by mouth at bedtime. 1:1 SR compound medication at [facility] [place].    . THEANINE PO Take by mouth.    Marland Kitchen UNABLE TO FIND every 30 (thirty) days. Formula A16B    . UNABLE TO FIND Sarspa    . [START ON 03/03/2021] zolpidem (AMBIEN CR) 12.5 MG CR tablet Take 1 tablet (12.5 mg total) by mouth at bedtime. 90 tablet  1   No current facility-administered medications for this visit.    Medication Side Effects: None  Allergies:  Allergies  Allergen Reactions  . Latex Rash    Past Medical History:  Diagnosis Date  . Anxiety   . Depression   . Hayfever     Family History  Problem Relation Age of Onset  . Cancer Mother        colon  . Depression Mother   . Diabetes Father   . Depression Father   . Depression Son   . Depression Paternal Uncle     Social History   Socioeconomic History  . Marital status: Married    Spouse name: Not on file  . Number of children: Not on file  . Years of education: Not on file  . Highest education level: Not on file  Occupational History  . Not on file  Tobacco Use  . Smoking status: Never Smoker  . Smokeless tobacco: Never Used  Substance and Sexual Activity  . Alcohol use: Yes  . Drug use: No  . Sexual activity: Not on file  Other Topics Concern  . Not on file  Social History Narrative  . Not on file   Social Determinants of Health   Financial Resource Strain: Not on file  Food Insecurity: Not on file  Transportation Needs: Not on file  Physical Activity: Not on file  Stress: Not on file  Social Connections: Not on file  Intimate Partner Violence: Not on file    Past Medical History, Surgical history, Social history, and Family history were reviewed and updated as appropriate.   Please see review of systems for further details on the patient's review from today.   Objective:   Physical Exam:  There were no vitals taken for this visit.  Physical Exam Constitutional:      General: She is not in acute distress. Musculoskeletal:        General: No deformity.  Neurological:     Mental Status: She is alert and oriented to person, place, and time.     Coordination: Coordination normal.  Psychiatric:        Attention and Perception: Attention and perception normal. She does not perceive auditory or visual hallucinations.         Mood and Affect: Mood normal. Mood is not anxious or depressed. Affect is not labile, blunt, angry or inappropriate.        Speech: Speech normal.        Behavior: Behavior is cooperative.        Thought Content: Thought content normal. Thought content is not paranoid or delusional. Thought content does not include homicidal or suicidal ideation. Thought content does not include homicidal or suicidal plan.  Cognition and Memory: Cognition and memory normal.        Judgment: Judgment normal.     Comments: Insight intact Circumstantial     Lab Review:  No results found for: NA, K, CL, CO2, GLUCOSE, BUN, CREATININE, CALCIUM, PROT, ALBUMIN, AST, ALT, ALKPHOS, BILITOT, GFRNONAA, GFRAA  No results found for: WBC, RBC, HGB, HCT, PLT, MCV, MCH, MCHC, RDW, LYMPHSABS, MONOABS, EOSABS, BASOSABS  No results found for: POCLITH, LITHIUM   No results found for: PHENYTOIN, PHENOBARB, VALPROATE, CBMZ   .res Assessment: Plan:   Will continue current plan of care since target signs and symptoms are well controlled without any tolerability issues. Pt reports improved mood and anxiety since increase in Doxepin to 100 mg po QHS and has resumed exercise, playing the piano, and social activity since increase in dose.  Continue Ambien CR 12.5 mg po QHS since past dose reductions have resulted in severe insomnia.  Continue Xanax 0.5 mg po QHS and prn anxiety.  Pt to follow-up in 6 months or sooner if clinically indicated.  Patient advised to contact office with any questions, adverse effects, or acute worsening in signs and symptoms.  Shelvy was seen today for follow-up.  Diagnoses and all orders for this visit:  Primary insomnia -     zolpidem (AMBIEN CR) 12.5 MG CR tablet; Take 1 tablet (12.5 mg total) by mouth at bedtime. -     ALPRAZolam (XANAX) 0.5 MG tablet; take one tablet by mouth at bedtime and one tablet by mouth every day as needed for anxiety -     doxepin (SINEQUAN) 100 MG capsule; Take 1  capsule (100 mg total) by mouth at bedtime.  Generalized anxiety disorder -     ALPRAZolam (XANAX) 0.5 MG tablet; take one tablet by mouth at bedtime and one tablet by mouth every day as needed for anxiety  Moderate recurrent major depression (HCC) -     doxepin (SINEQUAN) 100 MG capsule; Take 1 capsule (100 mg total) by mouth at bedtime.     Please see After Visit Summary for patient specific instructions.  Future Appointments  Date Time Provider [place]  12/25/2020 10:30 AM Rankin, Clent Demark, MD RDE-RDE None  01/08/2021 10:45 AM Rankin, Clent Demark, MD RDE-RDE None  06/22/2021 11:00 AM Thayer Headings, PMHNP CP-CP None    No orders of the defined types were placed in this encounter.   -------------------------------

## 2020-12-24 ENCOUNTER — Encounter (INDEPENDENT_AMBULATORY_CARE_PROVIDER_SITE_OTHER): Payer: PPO | Admitting: Ophthalmology

## 2020-12-25 ENCOUNTER — Encounter (INDEPENDENT_AMBULATORY_CARE_PROVIDER_SITE_OTHER): Payer: Self-pay | Admitting: Ophthalmology

## 2020-12-25 ENCOUNTER — Other Ambulatory Visit: Payer: Self-pay

## 2020-12-25 ENCOUNTER — Ambulatory Visit (INDEPENDENT_AMBULATORY_CARE_PROVIDER_SITE_OTHER): Payer: PPO | Admitting: Ophthalmology

## 2020-12-25 DIAGNOSIS — H353211 Exudative age-related macular degeneration, right eye, with active choroidal neovascularization: Secondary | ICD-10-CM | POA: Diagnosis not present

## 2020-12-25 DIAGNOSIS — H353221 Exudative age-related macular degeneration, left eye, with active choroidal neovascularization: Secondary | ICD-10-CM | POA: Diagnosis not present

## 2020-12-25 MED ORDER — AFLIBERCEPT 2MG/0.05ML IZ SOLN FOR KALEIDOSCOPE
2.0000 mg | INTRAVITREAL | Status: AC | PRN
Start: 2020-12-25 — End: 2020-12-25
  Administered 2020-12-25: 2 mg via INTRAVITREAL

## 2020-12-25 NOTE — Progress Notes (Signed)
12/25/2020     CHIEF COMPLAINT Patient presents for Retina Follow Up (8 WK FU OD, POSS EYLEA OD///Pt reports eyestrain OU. Pt denies any new F/F, no pain or pressure OU. )   HISTORY OF PRESENT ILLNESS: Angie Liu is a 75 y.o. female who presents to the clinic today for:   HPI    Retina Follow Up    Patient presents with  Wet AMD.  In right eye.  This started 8 weeks ago.  Duration of 8 weeks.  Since onset it is stable. Additional comments: 8 WK FU OD, POSS EYLEA OD   Pt reports eyestrain OU. Pt denies any new F/F, no pain or pressure OU.        Last edited by Donne Hazel on 12/25/2020 10:38 AM. (History)      Referring physician: Hayden Rasmussen, MD [place],  [place] [address]  HISTORICAL INFORMATION:   Selected notes from the MEDICAL RECORD NUMBER       CURRENT MEDICATIONS: No current outpatient medications on file. (Ophthalmic Drugs)   No current facility-administered medications for this visit. (Ophthalmic Drugs)   Current Outpatient Medications (Other)  Medication Sig  . [START ON 03/03/2021] ALPRAZolam (XANAX) 0.5 MG tablet take one tablet by mouth at bedtime and one tablet by mouth every day as needed for anxiety  . chlorpheniramine (CHLOR-TRIMETON) 4 MG tablet Take 4 mg by mouth 2 (two) times daily as needed for allergies.   . Cholecalciferol (VITAMIN D3) 1000 units CAPS Take by mouth.  . doxepin (SINEQUAN) 100 MG capsule Take 1 capsule (100 mg total) by mouth at bedtime.  . fluocinonide (LIDEX) 0.05 % external solution fluocinonide 0.05 % topical solution  . FOLIC ACID PO Take by mouth.  . melatonin 3 MG TABS tablet Take 3 mg by mouth at bedtime.  . Multiple Vitamins-Minerals (PRESERVISION AREDS PO) Take 1 tablet by mouth 2 (two) times daily.  . mupirocin ointment (BACTROBAN) 2 % mupirocin 2 % topical ointment  . OVER THE COUNTER MEDICATION 4 capsules daily. Calm CP, takes 1 breakfast, 1 at dinner and 2 at bedtime  . OVER THE  COUNTER MEDICATION 1 capsule at bedtime. Kavinace capusle  . OVER THE COUNTER MEDICATION Place 1 tablet under the tongue every morning. [place]  . OVER THE COUNTER MEDICATION Place 1 tablet under the tongue. Butyra  . OVER THE COUNTER MEDICATION Alpha gabba  . Progesterone Micronized (PROGESTERONE PO) Take 2 capsules by mouth at bedtime. 1:1 SR compound medication at [facility].  . THEANINE PO Take by mouth.  Marland Kitchen UNABLE TO FIND every 30 (thirty) days. Formula A16B  . UNABLE TO FIND Sarspa  . [START ON 03/03/2021] zolpidem (AMBIEN CR) 12.5 MG CR tablet Take 1 tablet (12.5 mg total) by mouth at bedtime.   No current facility-administered medications for this visit. (Other)      REVIEW OF SYSTEMS:    ALLERGIES Allergies  Allergen Reactions  . Latex Rash    PAST MEDICAL HISTORY Past Medical History:  Diagnosis Date  . Anxiety   . Depression   . Hayfever    Past Surgical History:  Procedure Laterality Date  . CATARACT EXTRACTION W/PHACO Bilateral 2018  . TONSILLECTOMY      FAMILY HISTORY Family History  Problem Relation Age of Onset  . Cancer Mother        colon  . Depression Mother   . Diabetes Father   . Depression Father   .  Depression Son   . Depression Paternal Uncle     SOCIAL HISTORY Social History   Tobacco Use  . Smoking status: Never Smoker  . Smokeless tobacco: Never Used  Substance Use Topics  . Alcohol use: Yes  . Drug use: No         OPHTHALMIC EXAM: Base Eye Exam    Visual Acuity (ETDRS)      Right Left   Dist cc 20/25 20/25 -2   Correction: Glasses       Tonometry (Tonopen, 10:43 AM)      Right Left   Pressure 9 9       Pupils      Pupils Dark Light Shape React APD   Right PERRL 4 3 Round Brisk None   Left PERRL 4 3 Round Brisk None       Visual Fields (Counting fingers)      Left Right    Full Full       Extraocular Movement      Right Left    Full Full       Neuro/Psych    Oriented x3: Yes   Mood/Affect: Normal        Dilation    Both eyes: 1.0% Mydriacyl, 2.5% Phenylephrine @ 10:43 AM        Slit Lamp and Fundus Exam    External Exam      Right Left   External Normal Normal       Slit Lamp Exam      Right Left   Lids/Lashes Normal Normal   Conjunctiva/Sclera White and quiet White and quiet   Cornea Clear Clear   Anterior Chamber Deep and quiet Deep and quiet   Iris Round and reactive Round and reactive   Lens Posterior chamber intraocular lens Posterior chamber intraocular lens   Anterior Vitreous Normal Normal       Fundus Exam      Right Left   Posterior Vitreous Posterior vitreous detachment    Disc Normal    C/D Ratio 0.5    Macula no hemorrhage, no exudates, Retinal pigment epithelial mottling, no macular thickening, Retinal pigment epithelial detachment    Vessels Normal    Periphery Normal           IMAGING AND PROCEDURES  Imaging and Procedures for 12/25/20  OCT, Retina - OU - Both Eyes       Right Eye Quality was good. Scan locations included subfoveal. Central Foveal Thickness: 239. Progression has been stable. Findings include pigment epithelial detachment, retinal drusen , no SRF, no IRF.   Left Eye Quality was good. Scan locations included subfoveal. Central Foveal Thickness: 249. Progression has improved. Findings include no IRF, no SRF.   Notes OS, now 2 months post therapy OS , follow-up in 3 weeks, no signs of CNVM active post intravitreal injection antivegF  OD at 8-week follow-up today post injection Eylea, pigment epithelial detachment stable, no intra or subretinal fluid , Maintain 8-week follow-up at this time       Intravitreal Injection, Pharmacologic Agent - OD - Right Eye       Time Out 12/25/2020. 11:12 AM. Confirmed correct patient, procedure, site, and patient consented.   Anesthesia Topical anesthesia was used. Anesthetic medications included Akten 3.5%.   Procedure Preparation included Ofloxacin , Tobramycin 0.3%, 10% betadine  to eyelids, 5% betadine to ocular surface. A 30 gauge needle was used.   Injection:  2 mg aflibercept (EYLEA) SOLN   NDC:  01093-235-57, Lot: [id number]   Route: Intravitreal, Site: Right Eye, Waste: 0 mg  Post-op Post injection exam found visual acuity of at least counting fingers. The patient tolerated the procedure well. There were no complications. The patient received written and verbal post procedure care education. Post injection medications were not given.                 ASSESSMENT/PLAN:  Exudative age-related macular degeneration of left eye with active choroidal neovascularization (HCC) OS macular anatomy vastly improved since onset of disease, a member 2020, additionally serous retinal detachment is resolved and addition to pigment epithelial detachments.  Patient currently at 31-month follow-up scheduled event to occur 01-08-2021  Exudative age-related macular degeneration of right eye with active choroidal neovascularization (HCC) OD, much improved macular anatomy with smaller vascularized pigment epithelial detachments and preserved acuity on therapy currently at 8-week follow-up.      ICD-10-CM   1. Exudative age-related macular degeneration of right eye with active choroidal neovascularization (HCC)  H35.3211 OCT, Retina - OU - Both Eyes    Intravitreal Injection, Pharmacologic Agent - OD - Right Eye    aflibercept (EYLEA) SOLN 2 mg  2. Exudative age-related macular degeneration of left eye with active choroidal neovascularization ([place])  H35.3221     1. OD, stabilized and improved macular anatomy on intravitreal Eylea currently at 8-week follow-up. We will repeat injection today and examination in 8 weeks  2. OS scheduled follow-up as 37-month interval, next follow-up due 01-08-2021 the macular anatomy looks great  3.  Ophthalmic Meds Ordered this visit:  Meds ordered this encounter  Medications  . aflibercept (EYLEA) SOLN 2 mg       Return in about 8  weeks (around 02/19/2021) for dilate, OD, EYLEA OCT.  There are no Patient Instructions on file for this visit.   Explained the diagnoses, plan, and follow up with the patient and they expressed understanding.  Patient expressed understanding of the importance of proper follow up care.   Clent Demark Jahnyla Parrillo M.D. Diseases & Surgery of the Retina and Vitreous Retina & Diabetic [place] 12/25/20     Abbreviations: M myopia (nearsighted); A astigmatism; H hyperopia (farsighted); P presbyopia; Mrx spectacle prescription;  CTL contact lenses; OD right eye; OS left eye; OU both eyes  XT exotropia; ET esotropia; PEK punctate epithelial keratitis; PEE punctate epithelial erosions; DES dry eye syndrome; MGD meibomian gland dysfunction; ATs artificial tears; PFAT's preservative free artificial tears; [place] nuclear sclerotic cataract; PSC posterior subcapsular cataract; ERM epi-retinal membrane; PVD posterior vitreous detachment; RD retinal detachment; DM diabetes mellitus; DR diabetic retinopathy; NPDR non-proliferative diabetic retinopathy; PDR proliferative diabetic retinopathy; CSME clinically significant macular edema; DME diabetic macular edema; dbh dot blot hemorrhages; CWS cotton wool spot; POAG primary open angle glaucoma; C/D cup-to-disc ratio; HVF humphrey visual field; GVF goldmann visual field; OCT optical coherence tomography; IOP intraocular pressure; BRVO Branch retinal vein occlusion; CRVO central retinal vein occlusion; CRAO central retinal artery occlusion; BRAO branch retinal artery occlusion; RT retinal tear; SB scleral buckle; PPV pars plana vitrectomy; VH Vitreous hemorrhage; PRP panretinal laser photocoagulation; IVK intravitreal kenalog; VMT vitreomacular traction; MH Macular hole;  NVD neovascularization of the disc; NVE neovascularization elsewhere; AREDS age related eye disease study; ARMD age related macular degeneration; POAG primary open angle glaucoma; EBMD epithelial/anterior basement  membrane dystrophy; ACIOL anterior chamber intraocular lens; IOL intraocular lens; PCIOL posterior chamber intraocular lens; Phaco/IOL phacoemulsification with intraocular lens placement; PRK photorefractive keratectomy; LASIK laser assisted in situ keratomileusis; HTN hypertension;  DM diabetes mellitus; COPD chronic obstructive pulmonary disease

## 2020-12-25 NOTE — Assessment & Plan Note (Signed)
OD, much improved macular anatomy with smaller vascularized pigment epithelial detachments and preserved acuity on therapy currently at 8-week follow-up.

## 2020-12-25 NOTE — Assessment & Plan Note (Signed)
OS macular anatomy vastly improved since onset of disease, a member 2020, additionally serous retinal detachment is resolved and addition to pigment epithelial detachments.  Patient currently at 19-month follow-up scheduled event to occur 01-08-2021

## 2021-01-02 DIAGNOSIS — M5136 Other intervertebral disc degeneration, lumbar region: Secondary | ICD-10-CM | POA: Diagnosis not present

## 2021-01-02 DIAGNOSIS — M9902 Segmental and somatic dysfunction of thoracic region: Secondary | ICD-10-CM | POA: Diagnosis not present

## 2021-01-02 DIAGNOSIS — M9904 Segmental and somatic dysfunction of sacral region: Secondary | ICD-10-CM | POA: Diagnosis not present

## 2021-01-02 DIAGNOSIS — M9903 Segmental and somatic dysfunction of lumbar region: Secondary | ICD-10-CM | POA: Diagnosis not present

## 2021-01-02 DIAGNOSIS — M9901 Segmental and somatic dysfunction of cervical region: Secondary | ICD-10-CM | POA: Diagnosis not present

## 2021-01-02 DIAGNOSIS — M5030 Other cervical disc degeneration, unspecified cervical region: Secondary | ICD-10-CM | POA: Diagnosis not present

## 2021-01-07 ENCOUNTER — Encounter (INDEPENDENT_AMBULATORY_CARE_PROVIDER_SITE_OTHER): Payer: PPO | Admitting: Ophthalmology

## 2021-01-07 DIAGNOSIS — M5136 Other intervertebral disc degeneration, lumbar region: Secondary | ICD-10-CM | POA: Diagnosis not present

## 2021-01-07 DIAGNOSIS — M9902 Segmental and somatic dysfunction of thoracic region: Secondary | ICD-10-CM | POA: Diagnosis not present

## 2021-01-07 DIAGNOSIS — M9901 Segmental and somatic dysfunction of cervical region: Secondary | ICD-10-CM | POA: Diagnosis not present

## 2021-01-07 DIAGNOSIS — M5030 Other cervical disc degeneration, unspecified cervical region: Secondary | ICD-10-CM | POA: Diagnosis not present

## 2021-01-07 DIAGNOSIS — M9903 Segmental and somatic dysfunction of lumbar region: Secondary | ICD-10-CM | POA: Diagnosis not present

## 2021-01-07 DIAGNOSIS — M9904 Segmental and somatic dysfunction of sacral region: Secondary | ICD-10-CM | POA: Diagnosis not present

## 2021-01-08 ENCOUNTER — Ambulatory Visit (INDEPENDENT_AMBULATORY_CARE_PROVIDER_SITE_OTHER): Payer: PPO | Admitting: Ophthalmology

## 2021-01-08 ENCOUNTER — Other Ambulatory Visit: Payer: Self-pay

## 2021-01-08 ENCOUNTER — Encounter (INDEPENDENT_AMBULATORY_CARE_PROVIDER_SITE_OTHER): Payer: Self-pay | Admitting: Ophthalmology

## 2021-01-08 DIAGNOSIS — H353211 Exudative age-related macular degeneration, right eye, with active choroidal neovascularization: Secondary | ICD-10-CM

## 2021-01-08 DIAGNOSIS — H353221 Exudative age-related macular degeneration, left eye, with active choroidal neovascularization: Secondary | ICD-10-CM | POA: Diagnosis not present

## 2021-01-08 MED ORDER — AFLIBERCEPT 2MG/0.05ML IZ SOLN FOR KALEIDOSCOPE
2.0000 mg | INTRAVITREAL | Status: AC | PRN
  Administered 2021-01-08: 2 mg via INTRAVITREAL

## 2021-01-08 NOTE — Progress Notes (Signed)
01/08/2021     CHIEF COMPLAINT Patient presents for Retina Follow Up (3 Mo F/U OU, poss Eylea OS//Pt sts OD gets "drier" more than OS. Pt sts, "my right eye feels different." VA stable overall OU per pt.)   HISTORY OF PRESENT ILLNESS: Angie Liu is a 75 y.o. female who presents to the clinic today for:   HPI    Retina Follow Up    Patient presents with  Wet AMD.  In both eyes.  This started 3 months ago.  Severity is mild.  Duration of 3 months.  Since onset it is stable. Additional comments: 3 Mo F/U OU, poss Eylea OS  Pt sts OD gets "drier" more than OS. Pt sts, "my right eye feels different." VA stable overall OU per pt.       Last edited by Rockie Neighbours, [place] on 01/08/2021 10:52 AM. (History)      Referring physician: Hayden Rasmussen, MD [place],  [place] [address]  HISTORICAL INFORMATION:   Selected notes from the MEDICAL RECORD NUMBER       CURRENT MEDICATIONS: No current outpatient medications on file. (Ophthalmic Drugs)   No current facility-administered medications for this visit. (Ophthalmic Drugs)   Current Outpatient Medications (Other)  Medication Sig  . [START ON 03/03/2021] ALPRAZolam (XANAX) 0.5 MG tablet take one tablet by mouth at bedtime and one tablet by mouth every day as needed for anxiety  . chlorpheniramine (CHLOR-TRIMETON) 4 MG tablet Take 4 mg by mouth 2 (two) times daily as needed for allergies.   . Cholecalciferol (VITAMIN D3) 1000 units CAPS Take by mouth.  . doxepin (SINEQUAN) 100 MG capsule Take 1 capsule (100 mg total) by mouth at bedtime.  . fluocinonide (LIDEX) 0.05 % external solution fluocinonide 0.05 % topical solution  . FOLIC ACID PO Take by mouth.  . melatonin 3 MG TABS tablet Take 3 mg by mouth at bedtime.  . Multiple Vitamins-Minerals (PRESERVISION AREDS PO) Take 1 tablet by mouth 2 (two) times daily.  . mupirocin ointment (BACTROBAN) 2 % mupirocin 2 % topical ointment  . OVER THE COUNTER MEDICATION 4  capsules daily. Calm CP, takes 1 breakfast, 1 at dinner and 2 at bedtime  . OVER THE COUNTER MEDICATION 1 capsule at bedtime. Kavinace capusle  . OVER THE COUNTER MEDICATION Place 1 tablet under the tongue every morning. [place]  . OVER THE COUNTER MEDICATION Place 1 tablet under the tongue. Butyra  . OVER THE COUNTER MEDICATION Alpha gabba  . Progesterone Micronized (PROGESTERONE PO) Take 2 capsules by mouth at bedtime. 1:1 SR compound medication at [facility].  . THEANINE PO Take by mouth.  Marland Kitchen UNABLE TO FIND every 30 (thirty) days. Formula A16B  . UNABLE TO FIND Sarspa  . [START ON 03/03/2021] zolpidem (AMBIEN CR) 12.5 MG CR tablet Take 1 tablet (12.5 mg total) by mouth at bedtime.   No current facility-administered medications for this visit. (Other)      REVIEW OF SYSTEMS:    ALLERGIES Allergies  Allergen Reactions  . Latex Rash    PAST MEDICAL HISTORY Past Medical History:  Diagnosis Date  . Anxiety   . Depression   . Hayfever    Past Surgical History:  Procedure Laterality Date  . CATARACT EXTRACTION W/PHACO Bilateral 2018  . TONSILLECTOMY      FAMILY HISTORY Family History  Problem Relation Age of Onset  . Cancer Mother        colon  .  Depression Mother   . Diabetes Father   . Depression Father   . Depression Son   . Depression Paternal Uncle     SOCIAL HISTORY Social History   Tobacco Use  . Smoking status: Never Smoker  . Smokeless tobacco: Never Used  Substance Use Topics  . Alcohol use: Yes  . Drug use: No         OPHTHALMIC EXAM: Base Eye Exam    Visual Acuity (ETDRS)      Right Left   Dist cc 20/40 20/40 +2   Dist ph cc NI NI   Correction: Glasses       Tonometry (Tonopen, 10:52 AM)      Right Left   Pressure 09 09       Pupils      Pupils Shape React APD   Right PERRL Round Brisk None   Left PERRL Round Brisk None       Visual Fields (Counting fingers)      Left Right    Full Full       Extraocular Movement      Right  Left    Full Full       Neuro/Psych    Oriented x3: Yes   Mood/Affect: Normal       Dilation    Both eyes: 1.0% Mydriacyl, 2.5% Phenylephrine @ 10:56 AM        Slit Lamp and Fundus Exam    External Exam      Right Left   External Normal Normal       Slit Lamp Exam      Right Left   Lids/Lashes Normal Normal   Conjunctiva/Sclera White and quiet White and quiet   Cornea Clear Clear   Anterior Chamber Deep and quiet Deep and quiet   Iris Round and reactive Round and reactive   Lens Posterior chamber intraocular lens Posterior chamber intraocular lens   Anterior Vitreous Normal Normal       Fundus Exam      Right Left   Posterior Vitreous Posterior vitreous detachment Posterior vitreous detachment   Disc Normal Normal   C/D Ratio 0.5 0.6   Macula no hemorrhage, no exudates, Retinal pigment epithelial mottling, no macular thickening, Retinal pigment epithelial detachment, Intermediate age related macular degeneration Atrophy, Retinal pigment epithelial detachment, Drusen, no exudates, no hemorrhage, no macular thickening, Soft drusen, Intermediate age related macular degeneration, Pigmented atrophy   Vessels Normal Normal   Periphery Normal Normal          IMAGING AND PROCEDURES  Imaging and Procedures for 01/08/21  OCT, Retina - OU - Both Eyes       Right Eye Quality was good. Scan locations included subfoveal. Central Foveal Thickness: 236. Progression has been stable. Findings include pigment epithelial detachment, retinal drusen , no SRF, no IRF.   Left Eye Quality was good. Scan locations included subfoveal. Central Foveal Thickness: 254. Progression has improved. Findings include no IRF, no SRF.   Notes OS, now 3 months post therapy OS , and much smaller vascularized PED and a solution of subretinal fluid previously in the foveal location as compared to November 2020. OD at 2-week follow-up today post injection Eylea, pigment epithelial detachment stable,  no intra or subretinal fluid , Maintain 8-week follow-up at this time       Intravitreal Injection, Pharmacologic Agent - OS - Left Eye       Time Out 01/08/2021. 11:29 AM. Confirmed correct patient, procedure, site,  and patient consented.   Anesthesia Topical anesthesia was used. Anesthetic medications included Akten 3.5%.   Procedure Preparation included 10% betadine to eyelids, 5% betadine to ocular surface, Tobramycin 0.3%. A 30 gauge needle was used.   Injection:  2 mg aflibercept Alfonse Flavors) SOLN   NDC: [id number], Lot: [id number]   Route: Intravitreal, Site: Left Eye, Waste: 0 mg  Post-op Post injection exam found visual acuity of at least counting fingers. The patient tolerated the procedure well. There were no complications. The patient received written and verbal post procedure care education. Post injection medications were not given.                 ASSESSMENT/PLAN:  Exudative age-related macular degeneration of right eye with active choroidal neovascularization ([place]) OD, improved since onset of therapy August 2021.  Today 2 weeks post injection, VM quiescent at this interval  Exudative age-related macular degeneration of left eye with active choroidal neovascularization (HCC) At 60-month follow-up OS today excellent macular findings anatomically with preserved visual acuity, repeat Eylea today because of the location and size and extent of disease at onset and treatment quiescent's of disease      ICD-10-CM   1. Exudative age-related macular degeneration of left eye with active choroidal neovascularization (HCC)  H35.3221 OCT, Retina - OU - Both Eyes    Intravitreal Injection, Pharmacologic Agent - OS - Left Eye    aflibercept (EYLEA) SOLN 2 mg  2. Exudative age-related macular degeneration of right eye with active choroidal neovascularization (HCC)  H35.3211 OCT, Retina - OU - Both Eyes    1.  OD follow-up as scheduled.  Today 2 weeks post injection  2.   OS now 3 months post last injection from disease onset November 2020.  We will repeat injection today and maintain interval of examination left eye in 3 months  3.  Ophthalmic Meds Ordered this visit:  Meds ordered this encounter  Medications  . aflibercept (EYLEA) SOLN 2 mg       Return in about 3 months (around 04/10/2021) for dilate, OS, EYLEA OCT.  There are no Patient Instructions on file for this visit.   Explained the diagnoses, plan, and follow up with the patient and they expressed understanding.  Patient expressed understanding of the importance of proper follow up care.   Clent Demark Sher Hellinger M.D. Diseases & Surgery of the Retina and Vitreous Retina & Diabetic [place] 01/08/21     Abbreviations: M myopia (nearsighted); A astigmatism; H hyperopia (farsighted); P presbyopia; Mrx spectacle prescription;  CTL contact lenses; OD right eye; OS left eye; OU both eyes  XT exotropia; ET esotropia; PEK punctate epithelial keratitis; PEE punctate epithelial erosions; DES dry eye syndrome; MGD meibomian gland dysfunction; ATs artificial tears; PFAT's preservative free artificial tears; [place] nuclear sclerotic cataract; PSC posterior subcapsular cataract; ERM epi-retinal membrane; PVD posterior vitreous detachment; RD retinal detachment; DM diabetes mellitus; DR diabetic retinopathy; NPDR non-proliferative diabetic retinopathy; PDR proliferative diabetic retinopathy; CSME clinically significant macular edema; DME diabetic macular edema; dbh dot blot hemorrhages; CWS cotton wool spot; POAG primary open angle glaucoma; C/D cup-to-disc ratio; HVF humphrey visual field; GVF goldmann visual field; OCT optical coherence tomography; IOP intraocular pressure; BRVO Branch retinal vein occlusion; CRVO central retinal vein occlusion; CRAO central retinal artery occlusion; BRAO branch retinal artery occlusion; RT retinal tear; SB scleral buckle; PPV pars plana vitrectomy; VH Vitreous hemorrhage; PRP  panretinal laser photocoagulation; IVK intravitreal kenalog; VMT vitreomacular traction; MH Macular hole;  NVD neovascularization of the disc;  NVE neovascularization elsewhere; AREDS age related eye disease study; ARMD age related macular degeneration; POAG primary open angle glaucoma; EBMD epithelial/anterior basement membrane dystrophy; ACIOL anterior chamber intraocular lens; IOL intraocular lens; PCIOL posterior chamber intraocular lens; Phaco/IOL phacoemulsification with intraocular lens placement; [place] photorefractive keratectomy; LASIK laser assisted in situ keratomileusis; HTN hypertension; DM diabetes mellitus; COPD chronic obstructive pulmonary disease

## 2021-01-08 NOTE — Assessment & Plan Note (Signed)
OD, improved since onset of therapy August 2021.  Today 2 weeks post injection, VM quiescent at this interval

## 2021-01-08 NOTE — Assessment & Plan Note (Signed)
At 9-month follow-up OS today excellent macular findings anatomically with preserved visual acuity, repeat Eylea today because of the location and size and extent of disease at onset and treatment quiescent's of disease

## 2021-01-15 DIAGNOSIS — M79605 Pain in left leg: Secondary | ICD-10-CM | POA: Diagnosis not present

## 2021-01-15 DIAGNOSIS — I890 Lymphedema, not elsewhere classified: Secondary | ICD-10-CM | POA: Diagnosis not present

## 2021-01-15 DIAGNOSIS — M79604 Pain in right leg: Secondary | ICD-10-CM | POA: Diagnosis not present

## 2021-01-21 DIAGNOSIS — M5030 Other cervical disc degeneration, unspecified cervical region: Secondary | ICD-10-CM | POA: Diagnosis not present

## 2021-01-21 DIAGNOSIS — M9904 Segmental and somatic dysfunction of sacral region: Secondary | ICD-10-CM | POA: Diagnosis not present

## 2021-01-21 DIAGNOSIS — M9902 Segmental and somatic dysfunction of thoracic region: Secondary | ICD-10-CM | POA: Diagnosis not present

## 2021-01-21 DIAGNOSIS — M9903 Segmental and somatic dysfunction of lumbar region: Secondary | ICD-10-CM | POA: Diagnosis not present

## 2021-01-21 DIAGNOSIS — M9901 Segmental and somatic dysfunction of cervical region: Secondary | ICD-10-CM | POA: Diagnosis not present

## 2021-01-21 DIAGNOSIS — M5136 Other intervertebral disc degeneration, lumbar region: Secondary | ICD-10-CM | POA: Diagnosis not present

## 2021-01-23 DIAGNOSIS — M9904 Segmental and somatic dysfunction of sacral region: Secondary | ICD-10-CM | POA: Diagnosis not present

## 2021-01-23 DIAGNOSIS — M9901 Segmental and somatic dysfunction of cervical region: Secondary | ICD-10-CM | POA: Diagnosis not present

## 2021-01-23 DIAGNOSIS — M5030 Other cervical disc degeneration, unspecified cervical region: Secondary | ICD-10-CM | POA: Diagnosis not present

## 2021-01-23 DIAGNOSIS — M5136 Other intervertebral disc degeneration, lumbar region: Secondary | ICD-10-CM | POA: Diagnosis not present

## 2021-01-23 DIAGNOSIS — M9902 Segmental and somatic dysfunction of thoracic region: Secondary | ICD-10-CM | POA: Diagnosis not present

## 2021-01-23 DIAGNOSIS — M9903 Segmental and somatic dysfunction of lumbar region: Secondary | ICD-10-CM | POA: Diagnosis not present

## 2021-01-26 DIAGNOSIS — M9902 Segmental and somatic dysfunction of thoracic region: Secondary | ICD-10-CM | POA: Diagnosis not present

## 2021-01-26 DIAGNOSIS — M9903 Segmental and somatic dysfunction of lumbar region: Secondary | ICD-10-CM | POA: Diagnosis not present

## 2021-01-26 DIAGNOSIS — M9904 Segmental and somatic dysfunction of sacral region: Secondary | ICD-10-CM | POA: Diagnosis not present

## 2021-01-26 DIAGNOSIS — M9901 Segmental and somatic dysfunction of cervical region: Secondary | ICD-10-CM | POA: Diagnosis not present

## 2021-01-26 DIAGNOSIS — M5136 Other intervertebral disc degeneration, lumbar region: Secondary | ICD-10-CM | POA: Diagnosis not present

## 2021-01-26 DIAGNOSIS — M5030 Other cervical disc degeneration, unspecified cervical region: Secondary | ICD-10-CM | POA: Diagnosis not present

## 2021-01-28 DIAGNOSIS — M5030 Other cervical disc degeneration, unspecified cervical region: Secondary | ICD-10-CM | POA: Diagnosis not present

## 2021-01-28 DIAGNOSIS — M9901 Segmental and somatic dysfunction of cervical region: Secondary | ICD-10-CM | POA: Diagnosis not present

## 2021-01-28 DIAGNOSIS — M9902 Segmental and somatic dysfunction of thoracic region: Secondary | ICD-10-CM | POA: Diagnosis not present

## 2021-01-28 DIAGNOSIS — M9904 Segmental and somatic dysfunction of sacral region: Secondary | ICD-10-CM | POA: Diagnosis not present

## 2021-01-28 DIAGNOSIS — M9903 Segmental and somatic dysfunction of lumbar region: Secondary | ICD-10-CM | POA: Diagnosis not present

## 2021-01-28 DIAGNOSIS — M5136 Other intervertebral disc degeneration, lumbar region: Secondary | ICD-10-CM | POA: Diagnosis not present

## 2021-01-29 DIAGNOSIS — H903 Sensorineural hearing loss, bilateral: Secondary | ICD-10-CM | POA: Diagnosis not present

## 2021-01-29 DIAGNOSIS — H9313 Tinnitus, bilateral: Secondary | ICD-10-CM | POA: Diagnosis not present

## 2021-01-29 DIAGNOSIS — H6123 Impacted cerumen, bilateral: Secondary | ICD-10-CM | POA: Diagnosis not present

## 2021-01-29 DIAGNOSIS — R2689 Other abnormalities of gait and mobility: Secondary | ICD-10-CM | POA: Diagnosis not present

## 2021-01-30 DIAGNOSIS — M9901 Segmental and somatic dysfunction of cervical region: Secondary | ICD-10-CM | POA: Diagnosis not present

## 2021-01-30 DIAGNOSIS — M9904 Segmental and somatic dysfunction of sacral region: Secondary | ICD-10-CM | POA: Diagnosis not present

## 2021-01-30 DIAGNOSIS — M9903 Segmental and somatic dysfunction of lumbar region: Secondary | ICD-10-CM | POA: Diagnosis not present

## 2021-01-30 DIAGNOSIS — M5030 Other cervical disc degeneration, unspecified cervical region: Secondary | ICD-10-CM | POA: Diagnosis not present

## 2021-01-30 DIAGNOSIS — M9902 Segmental and somatic dysfunction of thoracic region: Secondary | ICD-10-CM | POA: Diagnosis not present

## 2021-01-30 DIAGNOSIS — M5136 Other intervertebral disc degeneration, lumbar region: Secondary | ICD-10-CM | POA: Diagnosis not present

## 2021-02-02 DIAGNOSIS — M9903 Segmental and somatic dysfunction of lumbar region: Secondary | ICD-10-CM | POA: Diagnosis not present

## 2021-02-02 DIAGNOSIS — M5030 Other cervical disc degeneration, unspecified cervical region: Secondary | ICD-10-CM | POA: Diagnosis not present

## 2021-02-02 DIAGNOSIS — M9904 Segmental and somatic dysfunction of sacral region: Secondary | ICD-10-CM | POA: Diagnosis not present

## 2021-02-02 DIAGNOSIS — M5136 Other intervertebral disc degeneration, lumbar region: Secondary | ICD-10-CM | POA: Diagnosis not present

## 2021-02-02 DIAGNOSIS — M9902 Segmental and somatic dysfunction of thoracic region: Secondary | ICD-10-CM | POA: Diagnosis not present

## 2021-02-02 DIAGNOSIS — M9901 Segmental and somatic dysfunction of cervical region: Secondary | ICD-10-CM | POA: Diagnosis not present

## 2021-02-04 DIAGNOSIS — M9901 Segmental and somatic dysfunction of cervical region: Secondary | ICD-10-CM | POA: Diagnosis not present

## 2021-02-04 DIAGNOSIS — M5136 Other intervertebral disc degeneration, lumbar region: Secondary | ICD-10-CM | POA: Diagnosis not present

## 2021-02-04 DIAGNOSIS — M9903 Segmental and somatic dysfunction of lumbar region: Secondary | ICD-10-CM | POA: Diagnosis not present

## 2021-02-04 DIAGNOSIS — M5030 Other cervical disc degeneration, unspecified cervical region: Secondary | ICD-10-CM | POA: Diagnosis not present

## 2021-02-04 DIAGNOSIS — M9904 Segmental and somatic dysfunction of sacral region: Secondary | ICD-10-CM | POA: Diagnosis not present

## 2021-02-04 DIAGNOSIS — M9902 Segmental and somatic dysfunction of thoracic region: Secondary | ICD-10-CM | POA: Diagnosis not present

## 2021-02-06 DIAGNOSIS — M9902 Segmental and somatic dysfunction of thoracic region: Secondary | ICD-10-CM | POA: Diagnosis not present

## 2021-02-06 DIAGNOSIS — M9904 Segmental and somatic dysfunction of sacral region: Secondary | ICD-10-CM | POA: Diagnosis not present

## 2021-02-06 DIAGNOSIS — M5030 Other cervical disc degeneration, unspecified cervical region: Secondary | ICD-10-CM | POA: Diagnosis not present

## 2021-02-06 DIAGNOSIS — M5136 Other intervertebral disc degeneration, lumbar region: Secondary | ICD-10-CM | POA: Diagnosis not present

## 2021-02-06 DIAGNOSIS — M9901 Segmental and somatic dysfunction of cervical region: Secondary | ICD-10-CM | POA: Diagnosis not present

## 2021-02-06 DIAGNOSIS — M9903 Segmental and somatic dysfunction of lumbar region: Secondary | ICD-10-CM | POA: Diagnosis not present

## 2021-02-09 DIAGNOSIS — M5030 Other cervical disc degeneration, unspecified cervical region: Secondary | ICD-10-CM | POA: Diagnosis not present

## 2021-02-09 DIAGNOSIS — M9903 Segmental and somatic dysfunction of lumbar region: Secondary | ICD-10-CM | POA: Diagnosis not present

## 2021-02-09 DIAGNOSIS — M9904 Segmental and somatic dysfunction of sacral region: Secondary | ICD-10-CM | POA: Diagnosis not present

## 2021-02-09 DIAGNOSIS — M5136 Other intervertebral disc degeneration, lumbar region: Secondary | ICD-10-CM | POA: Diagnosis not present

## 2021-02-09 DIAGNOSIS — M9902 Segmental and somatic dysfunction of thoracic region: Secondary | ICD-10-CM | POA: Diagnosis not present

## 2021-02-09 DIAGNOSIS — M9901 Segmental and somatic dysfunction of cervical region: Secondary | ICD-10-CM | POA: Diagnosis not present

## 2021-02-11 DIAGNOSIS — M9902 Segmental and somatic dysfunction of thoracic region: Secondary | ICD-10-CM | POA: Diagnosis not present

## 2021-02-11 DIAGNOSIS — M9904 Segmental and somatic dysfunction of sacral region: Secondary | ICD-10-CM | POA: Diagnosis not present

## 2021-02-11 DIAGNOSIS — M5136 Other intervertebral disc degeneration, lumbar region: Secondary | ICD-10-CM | POA: Diagnosis not present

## 2021-02-11 DIAGNOSIS — M9903 Segmental and somatic dysfunction of lumbar region: Secondary | ICD-10-CM | POA: Diagnosis not present

## 2021-02-11 DIAGNOSIS — M9901 Segmental and somatic dysfunction of cervical region: Secondary | ICD-10-CM | POA: Diagnosis not present

## 2021-02-11 DIAGNOSIS — M5030 Other cervical disc degeneration, unspecified cervical region: Secondary | ICD-10-CM | POA: Diagnosis not present

## 2021-02-13 DIAGNOSIS — M9904 Segmental and somatic dysfunction of sacral region: Secondary | ICD-10-CM | POA: Diagnosis not present

## 2021-02-13 DIAGNOSIS — M5136 Other intervertebral disc degeneration, lumbar region: Secondary | ICD-10-CM | POA: Diagnosis not present

## 2021-02-13 DIAGNOSIS — M9902 Segmental and somatic dysfunction of thoracic region: Secondary | ICD-10-CM | POA: Diagnosis not present

## 2021-02-13 DIAGNOSIS — M5030 Other cervical disc degeneration, unspecified cervical region: Secondary | ICD-10-CM | POA: Diagnosis not present

## 2021-02-13 DIAGNOSIS — M9903 Segmental and somatic dysfunction of lumbar region: Secondary | ICD-10-CM | POA: Diagnosis not present

## 2021-02-13 DIAGNOSIS — M9901 Segmental and somatic dysfunction of cervical region: Secondary | ICD-10-CM | POA: Diagnosis not present

## 2021-02-18 ENCOUNTER — Other Ambulatory Visit: Payer: Self-pay

## 2021-02-18 ENCOUNTER — Encounter (INDEPENDENT_AMBULATORY_CARE_PROVIDER_SITE_OTHER): Payer: Self-pay | Admitting: Ophthalmology

## 2021-02-18 ENCOUNTER — Ambulatory Visit (INDEPENDENT_AMBULATORY_CARE_PROVIDER_SITE_OTHER): Payer: PPO | Admitting: Ophthalmology

## 2021-02-18 DIAGNOSIS — H353221 Exudative age-related macular degeneration, left eye, with active choroidal neovascularization: Secondary | ICD-10-CM | POA: Diagnosis not present

## 2021-02-18 DIAGNOSIS — H353211 Exudative age-related macular degeneration, right eye, with active choroidal neovascularization: Secondary | ICD-10-CM | POA: Diagnosis not present

## 2021-02-18 MED ORDER — AFLIBERCEPT 2MG/0.05ML IZ SOLN FOR KALEIDOSCOPE
2.0000 mg | INTRAVITREAL | Status: AC | PRN
Start: 2021-02-18 — End: 2021-02-18
  Administered 2021-02-18: 2 mg via INTRAVITREAL

## 2021-02-18 NOTE — Assessment & Plan Note (Signed)
Today, smaller PED and less subretinal fluid at nearly 8 weeks post last injection Eylea repeat today and examination again in 8 weeks right eye

## 2021-02-18 NOTE — Assessment & Plan Note (Signed)
Last treatment was 5 weeks ago left eye, and still with improved overall macular anatomy.  Scheduled follow-up is 3 months from last injection left eye

## 2021-02-18 NOTE — Progress Notes (Signed)
02/18/2021     CHIEF COMPLAINT Patient presents for Retina Follow Up (64 WK F/U OD, poss Eylea OD//Pt c/o dryness OU. Pt [facility] stable OU. No other new symptoms reported OU.)   HISTORY OF PRESENT ILLNESS: Angie Liu is a 75 y.o. female who presents to the clinic today for:   HPI    Retina Follow Up    Patient presents with  Wet AMD.  In right eye.  This started 8 weeks ago.  Severity is mild.  Duration of 8 weeks.  Since onset it is stable. Additional comments: 8 WK F/U OD, poss Eylea OD  Pt c/o dryness OU. Pt st[facility] stable OU. No other new symptoms reported OU.       Last edited by Rockie Neighbours, Pace on 02/18/2021  9:59 AM. (History)      Referring physician: Hayden Rasmussen, MD [address],  [place] [address]  HISTORICAL INFORMATION:   Selected notes from the MEDICAL RECORD NUMBER       CURRENT MEDICATIONS: No current outpatient medications on file. (Ophthalmic Drugs)   No current facility-administered medications for this visit. (Ophthalmic Drugs)   Current Outpatient Medications (Other)  Medication Sig  . [START ON 03/03/2021] ALPRAZolam (XANAX) 0.5 MG tablet take one tablet by mouth at bedtime and one tablet by mouth every day as needed for anxiety  . chlorpheniramine (CHLOR-TRIMETON) 4 MG tablet Take 4 mg by mouth 2 (two) times daily as needed for allergies.   . Cholecalciferol (VITAMIN D3) 1000 units CAPS Take by mouth.  . doxepin (SINEQUAN) 100 MG capsule Take 1 capsule (100 mg total) by mouth at bedtime.  . fluocinonide (LIDEX) 0.05 % external solution fluocinonide 0.05 % topical solution  . FOLIC ACID PO Take by mouth.  . melatonin 3 MG TABS tablet Take 3 mg by mouth at bedtime.  . Multiple Vitamins-Minerals (PRESERVISION AREDS PO) Take 1 tablet by mouth 2 (two) times daily.  . mupirocin ointment (BACTROBAN) 2 % mupirocin 2 % topical ointment  . OVER THE COUNTER MEDICATION 4 capsules daily. Calm CP, takes 1 breakfast, 1 at dinner and 2  at bedtime  . OVER THE COUNTER MEDICATION 1 capsule at bedtime. Kavinace capusle  . OVER THE COUNTER MEDICATION Place 1 tablet under the tongue every morning. [place]  . OVER THE COUNTER MEDICATION Place 1 tablet under the tongue. Butyra  . OVER THE COUNTER MEDICATION Alpha gabba  . Progesterone Micronized (PROGESTERONE PO) Take 2 capsules by mouth at bedtime. 1:1 SR compound medication at [facility] [place].  . THEANINE PO Take by mouth.  Marland Kitchen UNABLE TO FIND every 30 (thirty) days. Formula A16B  . UNABLE TO FIND Sarspa  . [START ON 03/03/2021] zolpidem (AMBIEN CR) 12.5 MG CR tablet Take 1 tablet (12.5 mg total) by mouth at bedtime.   No current facility-administered medications for this visit. (Other)      REVIEW OF SYSTEMS:    ALLERGIES Allergies  Allergen Reactions  . Latex Rash    PAST MEDICAL HISTORY Past Medical History:  Diagnosis Date  . Anxiety   . Depression   . Hayfever    Past Surgical History:  Procedure Laterality Date  . CATARACT EXTRACTION W/PHACO Bilateral 2018  . TONSILLECTOMY      FAMILY HISTORY Family History  Problem Relation Age of Onset  . Cancer Mother        colon  . Depression Mother   . Diabetes Father   .  Depression Father   . Depression Son   . Depression Paternal Uncle     SOCIAL HISTORY Social History   Tobacco Use  . Smoking status: Never Smoker  . Smokeless tobacco: Never Used  Substance Use Topics  . Alcohol use: Yes  . Drug use: No         OPHTHALMIC EXAM:  Base Eye Exam    Visual Acuity (ETDRS)      Right Left   Dist cc 20/30 -2 20/25 -2   Dist ph cc NI    Correction: Glasses       Tonometry (Tonopen, 10:03 AM)      Right Left   Pressure 11 10       Pupils      Pupils Dark Light Shape React APD   Right PERRL 5 4 Round Brisk None   Left PERRL 5 4 Round Brisk None       Visual Fields (Counting fingers)      Left Right    Full Full       Extraocular Movement      Right Left    Full Full       Neuro/Psych     Oriented x3: Yes   Mood/Affect: Normal       Dilation    Right eye: 1.0% Mydriacyl, 2.5% Phenylephrine @ 10:03 AM        Slit Lamp and Fundus Exam    External Exam      Right Left   External Normal Normal       Slit Lamp Exam      Right Left   Lids/Lashes Normal Normal   Conjunctiva/Sclera White and quiet White and quiet   Cornea Clear Clear   Anterior Chamber Deep and quiet Deep and quiet   Iris Round and reactive Round and reactive   Lens Posterior chamber intraocular lens Posterior chamber intraocular lens   Anterior Vitreous Normal Normal       Fundus Exam      Right Left   Posterior Vitreous Posterior vitreous detachment    Disc Normal    C/D Ratio 0.5    Macula no hemorrhage, no exudates, Retinal pigment epithelial mottling, no macular thickening, Retinal pigment epithelial detachment    Vessels Normal    Periphery Normal           IMAGING AND PROCEDURES  Imaging and Procedures for 02/18/21  OCT, Retina - OU - Both Eyes       Right Eye Quality was good. Scan locations included subfoveal. Central Foveal Thickness: 235. Progression has been stable. Findings include pigment epithelial detachment, retinal drusen , no SRF, no IRF.   Left Eye Quality was good. Scan locations included subfoveal. Central Foveal Thickness: 254. Progression has improved. Findings include no IRF, no SRF.   Notes OS, now 5 weeks post therapy OS , and much smaller vascularized PED and a solution of subretinal fluid previously in the foveal location as compared to November 2020.  OD at 7-week follow-up today post injection Eylea, pigment epithelial detachment stable, no intra or subretinal fluid , Maintain 8-week follow-up at this time       Intravitreal Injection, Pharmacologic Agent - OD - Right Eye       Time Out 02/18/2021. 10:53 AM. Confirmed correct patient, procedure, site, and patient consented.   Anesthesia Topical anesthesia was used. Anesthetic medications  included Akten 3.5%.   Procedure Preparation included Ofloxacin , Tobramycin 0.3%, 10% betadine to eyelids, 5%  betadine to ocular surface. A 30 gauge needle was used.   Injection:  2 mg aflibercept Alfonse Flavors) SOLN   NDC: [id number], Lot: [id number]   Route: Intravitreal, Site: Right Eye, Waste: 0 mg  Post-op Post injection exam found visual acuity of at least counting fingers. The patient tolerated the procedure well. There were no complications. The patient received written and verbal post procedure care education. Post injection medications were not given.                 ASSESSMENT/PLAN:  Exudative age-related macular degeneration of left eye with active choroidal neovascularization (HCC) Last treatment was 5 weeks ago left eye, and still with improved overall macular anatomy.  Scheduled follow-up is 3 months from last injection left eye  Exudative age-related macular degeneration of right eye with active choroidal neovascularization (HCC) Today, smaller PED and less subretinal fluid at nearly 8 weeks post last injection Eylea repeat today and examination again in 8 weeks right eye      ICD-10-CM   1. Exudative age-related macular degeneration of right eye with active choroidal neovascularization (HCC)  H35.3211 OCT, Retina - OU - Both Eyes    Intravitreal Injection, Pharmacologic Agent - OD - Right Eye    aflibercept (EYLEA) SOLN 2 mg  2. Exudative age-related macular degeneration of left eye with active choroidal neovascularization ([place])  H35.3221     1.  Wet AMD OU, OD OS has improved nicely and is currently on 95-month follow-up examination today 5 weeks post last injection OS  2.  Today OD nearly 8 weeks post previous injection, improving pigment epithelial detachment and subretinal fluid in the subfoveal location, we will repeat injection today and examination next again in 8 weeks  3.  Ophthalmic Meds Ordered this visit:  Meds ordered this encounter  Medications   . aflibercept (EYLEA) SOLN 2 mg       Return in about 8 weeks (around 04/15/2021) for dilate, OD, EYLEA OCT.  There are no Patient Instructions on file for this visit.   Explained the diagnoses, plan, and follow up with the patient and they expressed understanding.  Patient expressed understanding of the importance of proper follow up care.   Clent Demark Quenten Nawaz M.D. Diseases & Surgery of the Retina and Vitreous Retina & Diabetic [place] 02/18/21     Abbreviations: M myopia (nearsighted); A astigmatism; H hyperopia (farsighted); P presbyopia; Mrx spectacle prescription;  CTL contact lenses; OD right eye; OS left eye; OU both eyes  XT exotropia; ET esotropia; PEK punctate epithelial keratitis; PEE punctate epithelial erosions; DES dry eye syndrome; MGD meibomian gland dysfunction; ATs artificial tears; PFAT's preservative free artificial tears; [place] nuclear sclerotic cataract; PSC posterior subcapsular cataract; ERM epi-retinal membrane; PVD posterior vitreous detachment; RD retinal detachment; DM diabetes mellitus; DR diabetic retinopathy; NPDR non-proliferative diabetic retinopathy; PDR proliferative diabetic retinopathy; CSME clinically significant macular edema; DME diabetic macular edema; dbh dot blot hemorrhages; CWS cotton wool spot; POAG primary open angle glaucoma; C/D cup-to-disc ratio; HVF humphrey visual field; GVF goldmann visual field; OCT optical coherence tomography; IOP intraocular pressure; BRVO Branch retinal vein occlusion; CRVO central retinal vein occlusion; CRAO central retinal artery occlusion; BRAO branch retinal artery occlusion; RT retinal tear; SB scleral buckle; PPV pars plana vitrectomy; VH Vitreous hemorrhage; PRP panretinal laser photocoagulation; IVK intravitreal kenalog; VMT vitreomacular traction; MH Macular hole;  NVD neovascularization of the disc; NVE neovascularization elsewhere; AREDS age related eye disease study; ARMD age related macular degeneration; POAG  primary open angle glaucoma;  EBMD epithelial/anterior basement membrane dystrophy; ACIOL anterior chamber intraocular lens; IOL intraocular lens; PCIOL posterior chamber intraocular lens; Phaco/IOL phacoemulsification with intraocular lens placement; [place] photorefractive keratectomy; LASIK laser assisted in situ keratomileusis; HTN hypertension; DM diabetes mellitus; COPD chronic obstructive pulmonary disease

## 2021-02-20 DIAGNOSIS — M5030 Other cervical disc degeneration, unspecified cervical region: Secondary | ICD-10-CM | POA: Diagnosis not present

## 2021-02-20 DIAGNOSIS — M9904 Segmental and somatic dysfunction of sacral region: Secondary | ICD-10-CM | POA: Diagnosis not present

## 2021-02-20 DIAGNOSIS — M5136 Other intervertebral disc degeneration, lumbar region: Secondary | ICD-10-CM | POA: Diagnosis not present

## 2021-02-20 DIAGNOSIS — M9902 Segmental and somatic dysfunction of thoracic region: Secondary | ICD-10-CM | POA: Diagnosis not present

## 2021-02-20 DIAGNOSIS — M9903 Segmental and somatic dysfunction of lumbar region: Secondary | ICD-10-CM | POA: Diagnosis not present

## 2021-02-20 DIAGNOSIS — M9901 Segmental and somatic dysfunction of cervical region: Secondary | ICD-10-CM | POA: Diagnosis not present

## 2021-02-25 DIAGNOSIS — M9903 Segmental and somatic dysfunction of lumbar region: Secondary | ICD-10-CM | POA: Diagnosis not present

## 2021-02-25 DIAGNOSIS — M9901 Segmental and somatic dysfunction of cervical region: Secondary | ICD-10-CM | POA: Diagnosis not present

## 2021-02-25 DIAGNOSIS — M5136 Other intervertebral disc degeneration, lumbar region: Secondary | ICD-10-CM | POA: Diagnosis not present

## 2021-02-25 DIAGNOSIS — M5030 Other cervical disc degeneration, unspecified cervical region: Secondary | ICD-10-CM | POA: Diagnosis not present

## 2021-02-25 DIAGNOSIS — M9902 Segmental and somatic dysfunction of thoracic region: Secondary | ICD-10-CM | POA: Diagnosis not present

## 2021-02-25 DIAGNOSIS — M9904 Segmental and somatic dysfunction of sacral region: Secondary | ICD-10-CM | POA: Diagnosis not present

## 2021-03-04 DIAGNOSIS — M5136 Other intervertebral disc degeneration, lumbar region: Secondary | ICD-10-CM | POA: Diagnosis not present

## 2021-03-04 DIAGNOSIS — M9901 Segmental and somatic dysfunction of cervical region: Secondary | ICD-10-CM | POA: Diagnosis not present

## 2021-03-04 DIAGNOSIS — M5414 Radiculopathy, thoracic region: Secondary | ICD-10-CM | POA: Diagnosis not present

## 2021-03-04 DIAGNOSIS — M9902 Segmental and somatic dysfunction of thoracic region: Secondary | ICD-10-CM | POA: Diagnosis not present

## 2021-03-04 DIAGNOSIS — M9903 Segmental and somatic dysfunction of lumbar region: Secondary | ICD-10-CM | POA: Diagnosis not present

## 2021-03-04 DIAGNOSIS — M9904 Segmental and somatic dysfunction of sacral region: Secondary | ICD-10-CM | POA: Diagnosis not present

## 2021-04-06 DIAGNOSIS — M9901 Segmental and somatic dysfunction of cervical region: Secondary | ICD-10-CM | POA: Diagnosis not present

## 2021-04-06 DIAGNOSIS — M9902 Segmental and somatic dysfunction of thoracic region: Secondary | ICD-10-CM | POA: Diagnosis not present

## 2021-04-06 DIAGNOSIS — M9903 Segmental and somatic dysfunction of lumbar region: Secondary | ICD-10-CM | POA: Diagnosis not present

## 2021-04-06 DIAGNOSIS — M5136 Other intervertebral disc degeneration, lumbar region: Secondary | ICD-10-CM | POA: Diagnosis not present

## 2021-04-06 DIAGNOSIS — M9904 Segmental and somatic dysfunction of sacral region: Secondary | ICD-10-CM | POA: Diagnosis not present

## 2021-04-06 DIAGNOSIS — M5414 Radiculopathy, thoracic region: Secondary | ICD-10-CM | POA: Diagnosis not present

## 2021-04-07 ENCOUNTER — Other Ambulatory Visit: Payer: Self-pay

## 2021-04-07 ENCOUNTER — Encounter (INDEPENDENT_AMBULATORY_CARE_PROVIDER_SITE_OTHER): Payer: Self-pay | Admitting: Ophthalmology

## 2021-04-07 ENCOUNTER — Ambulatory Visit (INDEPENDENT_AMBULATORY_CARE_PROVIDER_SITE_OTHER): Payer: PPO | Admitting: Ophthalmology

## 2021-04-07 DIAGNOSIS — B029 Zoster without complications: Secondary | ICD-10-CM | POA: Diagnosis not present

## 2021-04-08 ENCOUNTER — Encounter (INDEPENDENT_AMBULATORY_CARE_PROVIDER_SITE_OTHER): Payer: PPO | Admitting: Ophthalmology

## 2021-04-09 DIAGNOSIS — B029 Zoster without complications: Secondary | ICD-10-CM | POA: Diagnosis not present

## 2021-04-09 DIAGNOSIS — Z85828 Personal history of other malignant neoplasm of skin: Secondary | ICD-10-CM | POA: Diagnosis not present

## 2021-04-15 ENCOUNTER — Encounter (INDEPENDENT_AMBULATORY_CARE_PROVIDER_SITE_OTHER): Payer: PPO | Admitting: Ophthalmology

## 2021-04-16 ENCOUNTER — Encounter (INDEPENDENT_AMBULATORY_CARE_PROVIDER_SITE_OTHER): Payer: PPO | Admitting: Ophthalmology

## 2021-04-27 ENCOUNTER — Encounter: Payer: Self-pay | Admitting: Psychiatry

## 2021-04-27 ENCOUNTER — Ambulatory Visit: Payer: PPO | Admitting: Psychiatry

## 2021-04-27 ENCOUNTER — Ambulatory Visit: Payer: PPO | Admitting: Neurology

## 2021-04-27 ENCOUNTER — Other Ambulatory Visit: Payer: Self-pay

## 2021-04-27 DIAGNOSIS — F5101 Primary insomnia: Secondary | ICD-10-CM | POA: Diagnosis not present

## 2021-04-27 DIAGNOSIS — F331 Major depressive disorder, recurrent, moderate: Secondary | ICD-10-CM

## 2021-04-27 MED ORDER — DOXEPIN HCL 100 MG PO CAPS: 100.0000 mg | ORAL_CAPSULE | Freq: Every day | ORAL | 1 refills | Status: DC

## 2021-04-27 MED ORDER — ZOLPIDEM TARTRATE ER 12.5 MG PO TBCR: 12.5000 mg | EXTENDED_RELEASE_TABLET | Freq: Every day | ORAL | 1 refills | Status: DC

## 2021-04-27 NOTE — Progress Notes (Signed)
Angie Liu [id number] 15-May-1946 75 y.o.  Subjective:   Patient ID:  Angie Liu is a 75 y.o. (DOB Oct 27, 1946) female.  Chief Complaint:  Chief Complaint  Patient presents with   Insomnia   Follow-up    H/o Depression and anxiety     HPI [name] presents to the office today for follow-up of insomnia, anxiety, and depression. She is accompanied by her husband. She has recently had shingles and reports that it was affecting her mobility in her shoulder. She reports, "I'm on an emotional roller coaster." She reports that she had trouble sleeping the first 3 days and has been sleeping ok since then. She reports that she feels "grumpy." She reports recent depression in response to shingles. She reports having anxiety with travel and being on the highway. She reports high anxiety with riding as a passenger despite husband being a good driver. Husband reports that she has gained weight and been less mobile. She has not been walking as much outside. She reports that she does not like to walk on their streets since streets are not flat. She will occasionally walk along a path near home. She reports periods of low energy. Has not played piano recently and plans to get back into this. Describes motivation as fair. She reports concentration is poor. She reports that she will read a page and need to read it again. She also reports that she has been having trouble with her vision. She reports that her appetite was low yesterday. Appetite fluctuates. Denies SI.   She reports that husband is concerned about risks of Ambien.   Recently celebrated 53th anniversary. Has been traveling and returned Feb 01, 2023.   Her brother-in-law died February 19, 2023 and this has been a difficulty loss for her. She reports that she would talk with him weekly. Husband reports that brother-in-law was like a brother to her. His death was unexpected after a massive MI.   Husband reports that she had 2 glasses of wine daily  while on vacation. She reports she occasionally has "too much" wine.   [place] Office Visit from 03/06/2015 in Primary Care at [facility] Total Score 0        Review of Systems:  Review of Systems  Cardiovascular:  Positive for leg swelling.  Musculoskeletal:  Negative for gait problem.  Skin:  Positive for rash.  Psychiatric/Behavioral:         Please refer to HPI   Medications: I have reviewed the patient's current medications.  Current Outpatient Medications  Medication Sig Dispense Refill   ALPRAZolam (XANAX) 0.5 MG tablet take one tablet by mouth at bedtime and one tablet by mouth every day as needed for anxiety 180 tablet 1   chlorpheniramine (CHLOR-TRIMETON) 4 MG tablet Take 4 mg by mouth 2 (two) times daily as needed for allergies.      Cholecalciferol (VITAMIN D3) 1000 units CAPS Take by mouth.     fluocinonide (LIDEX) 0.05 % external solution fluocinonide 0.05 % topical solution     FOLIC ACID PO Take by mouth.     gabapentin (NEURONTIN) 300 MG capsule Take 300 mg by mouth 2 (two) times daily.     melatonin 3 MG TABS tablet Take 3 mg by mouth at bedtime.     Multiple Vitamins-Minerals (PRESERVISION AREDS PO) Take 1 tablet by mouth 2 (two) times daily.     mupirocin ointment (BACTROBAN) 2 % mupirocin 2 % topical ointment  OVER THE COUNTER MEDICATION 4 capsules daily. Calm CP, takes 1 breakfast, 1 at dinner and 2 at bedtime     OVER THE COUNTER MEDICATION 1 capsule at bedtime. Kavinace capusle     OVER THE COUNTER MEDICATION Place 1 tablet under the tongue every morning. Seneca     traMADol (ULTRAM) 50 MG tablet Take 50 mg by mouth every 6 (six) hours as needed.     doxepin (SINEQUAN) 100 MG capsule Take 1 capsule (100 mg total) by mouth at bedtime. 90 capsule 1   OVER THE COUNTER MEDICATION Place 1 tablet under the tongue. Butyra     OVER THE COUNTER MEDICATION Alpha gabba     Progesterone Micronized (PROGESTERONE PO) Take 2 capsules by mouth at  bedtime. 1:1 SR compound medication at [facility] [place].     THEANINE PO Take by mouth.     UNABLE TO FIND every 30 (thirty) days. Formula A16B     UNABLE TO FIND Sarspa     [START ON 05/30/2021] zolpidem (AMBIEN CR) 12.5 MG CR tablet Take 1 tablet (12.5 mg total) by mouth at bedtime. 90 tablet 1   No current facility-administered medications for this visit.    Medication Side Effects: Other: Feels unsteady with Tramadol and Gabapentin  Allergies:  Allergies  Allergen Reactions   Latex Rash    Past Medical History:  Diagnosis Date   Anxiety    Depression    Hayfever     Past Medical History, Surgical history, Social history, and Family history were reviewed and updated as appropriate.   Please see review of systems for further details on the patient's review from today.   Objective:   Physical Exam:  There were no vitals taken for this visit.  Physical Exam Constitutional:      General: She is not in acute distress. Musculoskeletal:        General: No deformity.  Neurological:     Mental Status: She is alert and oriented to person, place, and time.     Coordination: Coordination normal.  Psychiatric:        Attention and Perception: Attention and perception normal. She does not perceive auditory or visual hallucinations.        Mood and Affect: Affect is not labile, blunt, angry or inappropriate.        Speech: Speech normal.        Behavior: Behavior normal.        Thought Content: Thought content normal. Thought content is not paranoid or delusional. Thought content does not include homicidal or suicidal ideation. Thought content does not include homicidal or suicidal plan.        Cognition and Memory: Cognition and memory normal.        Judgment: Judgment normal.     Comments: Insight intact Mood presents as mildly anxious and dysphoric    Lab Review:  No results found for: NA, K, CL, CO2, GLUCOSE, BUN, CREATININE, CALCIUM, PROT, ALBUMIN, AST, ALT, ALKPHOS, BILITOT,  GFRNONAA, GFRAA  No results found for: WBC, RBC, HGB, HCT, PLT, MCV, MCH, MCHC, RDW, LYMPHSABS, MONOABS, EOSABS, BASOSABS  No results found for: POCLITH, LITHIUM   No results found for: PHENYTOIN, PHENOBARB, VALPROATE, CBMZ   .res Assessment: Plan:   Pt seen for 30 minutes and time spent discussing treatment plan. She and her husband report that they may wish to consider dose reduction of Ambien in the future once recent acute insomnia and shingles have fully resolved. Discussed potential benefits, risks,  and side effects of an orexin antagonist, like Belsomra or Dayvigo, to consider in the future.  Will continue current plan of care at this time.  Continue Ambien CR 12.5 mg po QHS for insomnia.  Continue Doxepin 100 mg po QHS for insomnia and depression.  Pt to follow-up in 3 months or sooner if clinically indicated.  Patient advised to contact office with any questions, adverse effects, or acute worsening in signs and symptoms.  Madelynn was seen today for insomnia and follow-up.  Diagnoses and all orders for this visit:  Primary insomnia -     zolpidem (AMBIEN CR) 12.5 MG CR tablet; Take 1 tablet (12.5 mg total) by mouth at bedtime. -     doxepin (SINEQUAN) 100 MG capsule; Take 1 capsule (100 mg total) by mouth at bedtime.  Moderate recurrent major depression (HCC) -     doxepin (SINEQUAN) 100 MG capsule; Take 1 capsule (100 mg total) by mouth at bedtime.    Please see After Visit Summary for patient specific instructions.  Future Appointments  Date Time Provider [place]  04/28/2021  9:15 AM Rankin, Clent Demark, MD RDE-RDE None  04/29/2021  9:15 AM Rankin, Clent Demark, MD RDE-RDE None  05/25/2021  2:00 PM Dohmeier, Asencion Partridge, MD [facility]NA[facility]GNA None  07/28/2021 11:00 AM Thayer Headings, PMHNP CP-CP None    No orders of the defined types were placed in this encounter.   -------------------------------

## 2021-04-28 ENCOUNTER — Encounter (INDEPENDENT_AMBULATORY_CARE_PROVIDER_SITE_OTHER): Payer: PPO | Admitting: Ophthalmology

## 2021-04-28 ENCOUNTER — Encounter (INDEPENDENT_AMBULATORY_CARE_PROVIDER_SITE_OTHER): Payer: Self-pay | Admitting: Ophthalmology

## 2021-04-28 ENCOUNTER — Ambulatory Visit (INDEPENDENT_AMBULATORY_CARE_PROVIDER_SITE_OTHER): Payer: PPO | Admitting: Ophthalmology

## 2021-04-28 DIAGNOSIS — H353221 Exudative age-related macular degeneration, left eye, with active choroidal neovascularization: Secondary | ICD-10-CM

## 2021-04-28 DIAGNOSIS — H353211 Exudative age-related macular degeneration, right eye, with active choroidal neovascularization: Secondary | ICD-10-CM

## 2021-04-28 MED ORDER — AFLIBERCEPT 2MG/0.05ML IZ SOLN FOR KALEIDOSCOPE
2.0000 mg | INTRAVITREAL | Status: AC | PRN
  Administered 2021-04-28: 2 mg via INTRAVITREAL

## 2021-04-28 NOTE — Assessment & Plan Note (Signed)
Vastly improved macular findings left eye, currently at 70-month follow-up.  No signs of intraretinal subretinal fluid.  We will repeat injection today to maintain resolution of prior CNVM  We will extend interval examination next to 14 weeks likely injection at that time to maintain prior to perhaps 1 day cessation of treatment OS

## 2021-04-28 NOTE — Assessment & Plan Note (Signed)
OD with subfoveal PED, follow-up as scheduled

## 2021-04-28 NOTE — Progress Notes (Signed)
04/28/2021     CHIEF COMPLAINT Patient presents for Retina Follow Up (3 month fu OS and Eylea OS/Pt states, "I am having an issue with shingles on the right side of my body. I have them up my neck and on my face. I think my vision is ok but I am not sure."/)   HISTORY OF PRESENT ILLNESS: Angie Liu is a 75 y.o. female who presents to the clinic today for:   HPI     Retina Follow Up           Diagnosis: Wet AMD   Laterality: right eye   Onset: 3 months ago   Severity: mild   Duration: 3 months   Course: stable   Comments: 3 month fu OS and Eylea OS Pt states, "I am having an issue with shingles on the right side of my body. I have them up my neck and on my face. I think my vision is ok but I am not sure."        Last edited by Kendra Opitz, COA on 04/28/2021  9:18 AM.      Referring physician: Hayden Rasmussen, MD [address],  [place] [address]  HISTORICAL INFORMATION:   Selected notes from the MEDICAL RECORD NUMBER       CURRENT MEDICATIONS: No current outpatient medications on file. (Ophthalmic Drugs)   No current facility-administered medications for this visit. (Ophthalmic Drugs)   Current Outpatient Medications (Other)  Medication Sig   ALPRAZolam (XANAX) 0.5 MG tablet take one tablet by mouth at bedtime and one tablet by mouth every day as needed for anxiety   chlorpheniramine (CHLOR-TRIMETON) 4 MG tablet Take 4 mg by mouth 2 (two) times daily as needed for allergies.    Cholecalciferol (VITAMIN D3) 1000 units CAPS Take by mouth.   doxepin (SINEQUAN) 100 MG capsule Take 1 capsule (100 mg total) by mouth at bedtime.   fluocinonide (LIDEX) 0.05 % external solution fluocinonide 0.05 % topical solution   FOLIC ACID PO Take by mouth.   gabapentin (NEURONTIN) 300 MG capsule Take 300 mg by mouth 2 (two) times daily.   melatonin 3 MG TABS tablet Take 3 mg by mouth at bedtime.   Multiple Vitamins-Minerals (PRESERVISION AREDS PO) Take 1  tablet by mouth 2 (two) times daily.   mupirocin ointment (BACTROBAN) 2 % mupirocin 2 % topical ointment   OVER THE COUNTER MEDICATION 4 capsules daily. Calm CP, takes 1 breakfast, 1 at dinner and 2 at bedtime   OVER THE COUNTER MEDICATION 1 capsule at bedtime. Kavinace capusle   OVER THE COUNTER MEDICATION Place 1 tablet under the tongue every morning. [place]   OVER THE COUNTER MEDICATION Place 1 tablet under the tongue. Butyra   OVER THE COUNTER MEDICATION Alpha gabba   Progesterone Micronized (PROGESTERONE PO) Take 2 capsules by mouth at bedtime. 1:1 SR compound medication at [facility].   THEANINE PO Take by mouth.   traMADol (ULTRAM) 50 MG tablet Take 50 mg by mouth every 6 (six) hours as needed.   UNABLE TO FIND every 30 (thirty) days. Formula A16B   UNABLE TO FIND Sarspa   [START ON 05/30/2021] zolpidem (AMBIEN CR) 12.5 MG CR tablet Take 1 tablet (12.5 mg total) by mouth at bedtime.   No current facility-administered medications for this visit. (Other)      REVIEW OF SYSTEMS:    ALLERGIES Allergies  Allergen Reactions   Latex Rash  PAST MEDICAL HISTORY Past Medical History:  Diagnosis Date   Anxiety    Depression    Hayfever    Past Surgical History:  Procedure Laterality Date   CATARACT EXTRACTION W/PHACO Bilateral 2018   TONSILLECTOMY      FAMILY HISTORY Family History  Problem Relation Age of Onset   Cancer Mother        colon   Depression Mother    Diabetes Father    Depression Father    Depression Son    Depression Paternal Uncle     SOCIAL HISTORY Social History   Tobacco Use   Smoking status: Never   Smokeless tobacco: Never  Substance Use Topics   Alcohol use: Yes   Drug use: No         OPHTHALMIC EXAM:  Base Eye Exam     Visual Acuity (ETDRS)       Right Left   Dist cc 20/50 20/40   Dist ph cc 20/40 20/30    Correction: Glasses         Tonometry (Tonopen, 9:23 AM)       Right Left   Pressure 09 10          Pupils       Pupils Dark Light Shape React APD   Right PERRL 5 4 Round Brisk None   Left PERRL 5 4 Round Brisk None         Visual Fields (Counting fingers)       Left Right    Full Full         Extraocular Movement       Right Left    Full Full         Neuro/Psych     Oriented x3: Yes   Mood/Affect: Normal         Dilation     Left eye: 1.0% Mydriacyl, 2.5% Phenylephrine @ 9:23 AM           Slit Lamp and Fundus Exam     External Exam       Right Left   External Normal Normal         Slit Lamp Exam       Right Left   Lids/Lashes Normal Normal   Conjunctiva/Sclera White and quiet White and quiet   Cornea Clear Clear   Anterior Chamber Deep and quiet Deep and quiet   Iris Round and reactive Round and reactive   Lens Posterior chamber intraocular lens Posterior chamber intraocular lens   Anterior Vitreous Normal Normal         Fundus Exam       Right Left   Posterior Vitreous Normal Posterior vitreous detachment   Disc Normal Normal   C/D Ratio 0.5 0.6   Macula Normal Atrophy, Retinal pigment epithelial detachment, Drusen, no exudates, no hemorrhage, no macular thickening, Soft drusen, Intermediate age related macular degeneration, Pigmented atrophy   Vessels Normal Normal   Periphery Normal Normal            IMAGING AND PROCEDURES  Imaging and Procedures for 04/28/21  OCT, Retina - OU - Both Eyes       Right Eye Quality was good. Scan locations included subfoveal. Central Foveal Thickness: 241. Progression has been stable. Findings include pigment epithelial detachment, retinal drusen , no SRF, no IRF.   Left Eye Quality was good. Scan locations included subfoveal. Central Foveal Thickness: 255. Progression has improved. Findings include no IRF, no SRF.  Notes OS, now 12 weeks post therapy OS , and much smaller vascularized PED and a solution of subretinal fluid previously in the foveal location as compared to November  2020.  OD at 9-week follow-up today post injection Eylea, pigment epithelial detachment stable, no intra or subretinal fluid , Maintain 9-week follow-up at this time     Intravitreal Injection, Pharmacologic Agent - OS - Left Eye       Time Out 04/28/2021. 9:48 AM. Confirmed correct patient, procedure, site, and patient consented.   Anesthesia Topical anesthesia was used. Anesthetic medications included Akten 3.5%.   Procedure Preparation included 10% betadine to eyelids, 5% betadine to ocular surface, Tobramycin 0.3%. A 30 gauge needle was used.   Injection: 2 mg aflibercept 2 MG/0.05ML   Route: Intravitreal, Site: Left Eye   NDC: A3590391, Lot: [id number], Waste: 0 mL   Post-op Post injection exam found visual acuity of at least counting fingers. The patient tolerated the procedure well. There were no complications. The patient received written and verbal post procedure care education. Post injection medications were not given.              ASSESSMENT/PLAN:  Exudative age-related macular degeneration of left eye with active choroidal neovascularization ([place]) Vastly improved macular findings left eye, currently at 37-month follow-up.  No signs of intraretinal subretinal fluid.  We will repeat injection today to maintain resolution of prior CNVM  We will extend interval examination next to 14 weeks likely injection at that time to maintain prior to perhaps 1 day cessation of treatment OS  Exudative age-related macular degeneration of right eye with active choroidal neovascularization ([place]) OD with subfoveal PED, follow-up as scheduled      ICD-10-CM   1. Exudative age-related macular degeneration of left eye with active choroidal neovascularization (HCC)  H35.3221 OCT, Retina - OU - Both Eyes    Intravitreal Injection, Pharmacologic Agent - OS - Left Eye    aflibercept (EYLEA) SOLN 2 mg    2. Exudative age-related macular degeneration of right eye with active  choroidal neovascularization ([place])  H35.3211       1.  OS, much improved overall currently at 74-month follow-up  2.  Repeat injection intravitreal Eylea OS today and examination extension to 14 weeks OS  3.  OD follow-up as scheduled  Ophthalmic Meds Ordered this visit:  Meds ordered this encounter  Medications   aflibercept (EYLEA) SOLN 2 mg       Return in about 14 weeks (around 08/04/2021) for dilate, OS, EYLEA OCT.  There are no Patient Instructions on file for this visit.   Explained the diagnoses, plan, and follow up with the patient and they expressed understanding.  Patient expressed understanding of the importance of proper follow up care.   Clent Demark Tiarna Koppen M.D. Diseases & Surgery of the Retina and Vitreous Retina & Diabetic [place] 04/28/21     Abbreviations: M myopia (nearsighted); A astigmatism; H hyperopia (farsighted); P presbyopia; Mrx spectacle prescription;  CTL contact lenses; OD right eye; OS left eye; OU both eyes  XT exotropia; ET esotropia; PEK punctate epithelial keratitis; PEE punctate epithelial erosions; DES dry eye syndrome; MGD meibomian gland dysfunction; ATs artificial tears; PFAT's preservative free artificial tears; [place] nuclear sclerotic cataract; PSC posterior subcapsular cataract; ERM epi-retinal membrane; PVD posterior vitreous detachment; RD retinal detachment; DM diabetes mellitus; DR diabetic retinopathy; NPDR non-proliferative diabetic retinopathy; PDR proliferative diabetic retinopathy; CSME clinically significant macular edema; DME diabetic macular edema; dbh dot blot hemorrhages; CWS cotton  wool spot; POAG primary open angle glaucoma; C/D cup-to-disc ratio; HVF humphrey visual field; GVF goldmann visual field; OCT optical coherence tomography; IOP intraocular pressure; BRVO Branch retinal vein occlusion; CRVO central retinal vein occlusion; CRAO central retinal artery occlusion; BRAO branch retinal artery occlusion; RT retinal tear; SB  scleral buckle; PPV pars plana vitrectomy; VH Vitreous hemorrhage; PRP panretinal laser photocoagulation; IVK intravitreal kenalog; VMT vitreomacular traction; MH Macular hole;  NVD neovascularization of the disc; NVE neovascularization elsewhere; AREDS age related eye disease study; ARMD age related macular degeneration; POAG primary open angle glaucoma; EBMD epithelial/anterior basement membrane dystrophy; ACIOL anterior chamber intraocular lens; IOL intraocular lens; PCIOL posterior chamber intraocular lens; Phaco/IOL phacoemulsification with intraocular lens placement; [place] photorefractive keratectomy; LASIK laser assisted in situ keratomileusis; HTN hypertension; DM diabetes mellitus; COPD chronic obstructive pulmonary disease

## 2021-04-29 ENCOUNTER — Encounter (INDEPENDENT_AMBULATORY_CARE_PROVIDER_SITE_OTHER): Payer: Self-pay | Admitting: Ophthalmology

## 2021-04-29 ENCOUNTER — Other Ambulatory Visit: Payer: Self-pay

## 2021-04-29 ENCOUNTER — Ambulatory Visit (INDEPENDENT_AMBULATORY_CARE_PROVIDER_SITE_OTHER): Payer: PPO | Admitting: Ophthalmology

## 2021-04-29 DIAGNOSIS — H353211 Exudative age-related macular degeneration, right eye, with active choroidal neovascularization: Secondary | ICD-10-CM

## 2021-04-29 DIAGNOSIS — H353221 Exudative age-related macular degeneration, left eye, with active choroidal neovascularization: Secondary | ICD-10-CM | POA: Diagnosis not present

## 2021-04-29 MED ORDER — AFLIBERCEPT 2MG/0.05ML IZ SOLN FOR KALEIDOSCOPE
2.0000 mg | INTRAVITREAL | Status: AC | PRN
  Administered 2021-04-29: 2 mg via INTRAVITREAL

## 2021-04-29 NOTE — Progress Notes (Signed)
04/29/2021     CHIEF COMPLAINT Patient presents for Retina Follow Up (10 Wk F/U OD, poss Eylea OD//Pt c/o "lots of mucus" OS last night, and sts her eye wasn't rinsed out following injection. OD stable.)   HISTORY OF PRESENT ILLNESS: Angie Liu is a 75 y.o. female who presents to the clinic today for:   HPI     Retina Follow Up           Diagnosis: Wet AMD   Laterality: right eye   Onset: 10 weeks ago   Severity: mild   Duration: 10 weeks   Course: stable   Comments: 10 Wk F/U OD, poss Eylea OD  Pt c/o "lots of mucus" OS last night, and sts her eye wasn't rinsed out following injection. OD stable.       Last edited by Milly Jakob, [place] on 04/29/2021  9:28 AM.      Referring physician: Hayden Rasmussen, MD [address] [place] [place],  [place] [address]  HISTORICAL INFORMATION:   Selected notes from the MEDICAL RECORD NUMBER       CURRENT MEDICATIONS: No current outpatient medications on file. (Ophthalmic Drugs)   No current facility-administered medications for this visit. (Ophthalmic Drugs)   Current Outpatient Medications (Other)  Medication Sig   ALPRAZolam (XANAX) 0.5 MG tablet take one tablet by mouth at bedtime and one tablet by mouth every day as needed for anxiety   chlorpheniramine (CHLOR-TRIMETON) 4 MG tablet Take 4 mg by mouth 2 (two) times daily as needed for allergies.    Cholecalciferol (VITAMIN D3) 1000 units CAPS Take by mouth.   doxepin (SINEQUAN) 100 MG capsule Take 1 capsule (100 mg total) by mouth at bedtime.   fluocinonide (LIDEX) 0.05 % external solution fluocinonide 0.05 % topical solution   FOLIC ACID PO Take by mouth.   gabapentin (NEURONTIN) 300 MG capsule Take 300 mg by mouth 2 (two) times daily.   melatonin 3 MG TABS tablet Take 3 mg by mouth at bedtime.   Multiple Vitamins-Minerals (PRESERVISION AREDS PO) Take 1 tablet by mouth 2 (two) times daily.   mupirocin ointment (BACTROBAN) 2 % mupirocin 2 % topical ointment   OVER  THE COUNTER MEDICATION 4 capsules daily. Calm CP, takes 1 breakfast, 1 at dinner and 2 at bedtime   OVER THE COUNTER MEDICATION 1 capsule at bedtime. Kavinace capusle   OVER THE COUNTER MEDICATION Place 1 tablet under the tongue every morning. [place]   OVER THE COUNTER MEDICATION Place 1 tablet under the tongue. Butyra   OVER THE COUNTER MEDICATION Alpha gabba   Progesterone Micronized (PROGESTERONE PO) Take 2 capsules by mouth at bedtime. 1:1 SR compound medication at [facility].   THEANINE PO Take by mouth.   traMADol (ULTRAM) 50 MG tablet Take 50 mg by mouth every 6 (six) hours as needed.   UNABLE TO FIND every 30 (thirty) days. Formula A16B   UNABLE TO FIND Sarspa   [START ON 05/30/2021] zolpidem (AMBIEN CR) 12.5 MG CR tablet Take 1 tablet (12.5 mg total) by mouth at bedtime.   No current facility-administered medications for this visit. (Other)      REVIEW OF SYSTEMS:    ALLERGIES Allergies  Allergen Reactions   Latex Rash    PAST MEDICAL HISTORY Past Medical History:  Diagnosis Date   Anxiety    Depression    Hayfever    Past Surgical History:  Procedure Laterality Date   CATARACT EXTRACTION W/PHACO Bilateral  2018   TONSILLECTOMY      FAMILY HISTORY Family History  Problem Relation Age of Onset   Cancer Mother        colon   Depression Mother    Diabetes Father    Depression Father    Depression Son    Depression Paternal Uncle     SOCIAL HISTORY Social History   Tobacco Use   Smoking status: Never   Smokeless tobacco: Never  Substance Use Topics   Alcohol use: Yes   Drug use: No         OPHTHALMIC EXAM:  Base Eye Exam     Visual Acuity (ETDRS)       Right Left   Dist cc 20/30 -1 20/25 -2   Dist ph cc 20/25 -1     Correction: Glasses         Tonometry (Tonopen, 9:34 AM)       Right Left   Pressure 11 09         Pupils       Pupils Dark Light Shape React APD   Right PERRL 4 3 Round Brisk None   Left PERRL 4 3 Round Brisk  None         Visual Fields (Counting fingers)       Left Right    Full Full         Extraocular Movement       Right Left    Full Full         Neuro/Psych     Oriented x3: Yes   Mood/Affect: Normal         Dilation     Right eye: 1.0% Mydriacyl, 2.5% Phenylephrine @ 9:34 AM           Slit Lamp and Fundus Exam     External Exam       Right Left   External Normal Normal         Slit Lamp Exam       Right Left   Lids/Lashes Normal Normal   Conjunctiva/Sclera White and quiet White and quiet   Cornea Clear Clear   Anterior Chamber Deep and quiet Deep and quiet   Iris Round and reactive Round and reactive   Lens Posterior chamber intraocular lens Posterior chamber intraocular lens   Anterior Vitreous Normal Normal         Fundus Exam       Right Left   Posterior Vitreous Posterior vitreous detachment    Disc Normal    C/D Ratio 0.5    Macula no hemorrhage, no exudates, Retinal pigment epithelial mottling, no macular thickening, Retinal pigment epithelial detachment    Vessels Normal    Periphery Normal             IMAGING AND PROCEDURES  Imaging and Procedures for 04/29/21  Intravitreal Injection, Pharmacologic Agent - OD - Right Eye       Time Out 04/29/2021. 10:26 AM. Confirmed correct patient, procedure, site, and patient consented.   Anesthesia Topical anesthesia was used. Anesthetic medications included Akten 3.5%.   Procedure Preparation included Ofloxacin , Tobramycin 0.3%, 10% betadine to eyelids, 5% betadine to ocular surface. A 30 gauge needle was used.   Injection: 2 mg aflibercept 2 MG/0.05ML   Route: Intravitreal, Site: Right Eye   NDC: [id number], Lot: [id number], Waste: 0 mL   Post-op Post injection exam found visual acuity of at least counting fingers. The patient tolerated the procedure well.  There were no complications. The patient received written and verbal post procedure care education. Post injection  medications were not given.              ASSESSMENT/PLAN:  Exudative age-related macular degeneration of left eye with active choroidal neovascularization (HCC) 1 day post recent injection Eylea  Exudative age-related macular degeneration of right eye with active choroidal neovascularization ([place]) OD currently at 10-week follow-up post injection Eylea, will repeat injection today and consider examination in 12 weeks     ICD-10-CM   1. Exudative age-related macular degeneration of right eye with active choroidal neovascularization (HCC)  H35.3211 Intravitreal Injection, Pharmacologic Agent - OD - Right Eye    aflibercept (EYLEA) SOLN 2 mg    CANCELED: OCT, Retina - OU - Both Eyes    2. Exudative age-related macular degeneration of left eye with active choroidal neovascularization (HCC)  H35.3221       1.  OS, 1 day post injection Eylea follow-up as scheduled  2.  OD today at 10 weeks follow-up post injection Eylea with chronic subfoveal pigment epithelial detachment, yet no intraretinal fluid nor subretinal fluid thus stable.  We will repeat injection today and extend interval to 12 weeks to monitor for recurrences  3.  Ophthalmic Meds Ordered this visit:  Meds ordered this encounter  Medications   aflibercept (EYLEA) SOLN 2 mg       Return in about 12 weeks (around 07/22/2021) for dilate, OD, EYLEA OCT.  There are no Patient Instructions on file for this visit.   Explained the diagnoses, plan, and follow up with the patient and they expressed understanding.  Patient expressed understanding of the importance of proper follow up care.   Clent Demark Valeta Paz M.D. Diseases & Surgery of the Retina and Vitreous Retina & Diabetic [place] 04/29/21     Abbreviations: M myopia (nearsighted); A astigmatism; H hyperopia (farsighted); P presbyopia; Mrx spectacle prescription;  CTL contact lenses; OD right eye; OS left eye; OU both eyes  XT exotropia; ET esotropia; PEK punctate  epithelial keratitis; PEE punctate epithelial erosions; DES dry eye syndrome; MGD meibomian gland dysfunction; ATs artificial tears; PFAT's preservative free artificial tears; [place] nuclear sclerotic cataract; PSC posterior subcapsular cataract; ERM epi-retinal membrane; PVD posterior vitreous detachment; RD retinal detachment; DM diabetes mellitus; DR diabetic retinopathy; NPDR non-proliferative diabetic retinopathy; PDR proliferative diabetic retinopathy; CSME clinically significant macular edema; DME diabetic macular edema; dbh dot blot hemorrhages; CWS cotton wool spot; POAG primary open angle glaucoma; C/D cup-to-disc ratio; HVF humphrey visual field; GVF goldmann visual field; OCT optical coherence tomography; IOP intraocular pressure; BRVO Branch retinal vein occlusion; CRVO central retinal vein occlusion; CRAO central retinal artery occlusion; BRAO branch retinal artery occlusion; RT retinal tear; SB scleral buckle; PPV pars plana vitrectomy; VH Vitreous hemorrhage; PRP panretinal laser photocoagulation; IVK intravitreal kenalog; VMT vitreomacular traction; MH Macular hole;  NVD neovascularization of the disc; NVE neovascularization elsewhere; AREDS age related eye disease study; ARMD age related macular degeneration; POAG primary open angle glaucoma; EBMD epithelial/anterior basement membrane dystrophy; ACIOL anterior chamber intraocular lens; IOL intraocular lens; PCIOL posterior chamber intraocular lens; Phaco/IOL phacoemulsification with intraocular lens placement; [place] photorefractive keratectomy; LASIK laser assisted in situ keratomileusis; HTN hypertension; DM diabetes mellitus; COPD chronic obstructive pulmonary disease

## 2021-04-29 NOTE — Assessment & Plan Note (Signed)
OD currently at 10-week follow-up post injection Eylea, will repeat injection today and consider examination in 12 weeks

## 2021-04-29 NOTE — Assessment & Plan Note (Signed)
1 day post recent injection Eylea

## 2021-05-21 ENCOUNTER — Telehealth: Payer: Self-pay | Admitting: Neurology

## 2021-05-21 NOTE — Telephone Encounter (Signed)
Pt in in her 7th week of having shingles. Wants to know if it is still okay to come to the apt on Monday. Best call back is [phone]

## 2021-05-21 NOTE — Telephone Encounter (Signed)
Called and spoke w/ pt. She has no active/open blisters. Healing. Advised per Dr. Brett Fairy, ok to keep appt. Asked she check in at 130p for 2p appt. She verbalized understanding. Husband will be bringing her.

## 2021-05-21 NOTE — Telephone Encounter (Signed)
Reviewed pt chart. She has new pt appt scheduled for 05/25/21 at 2p w/ Dr. Brett Fairy. ENT referred her to [place] for floating/imbalance.

## 2021-05-25 ENCOUNTER — Encounter: Payer: Self-pay | Admitting: Neurology

## 2021-05-25 ENCOUNTER — Ambulatory Visit: Payer: PPO | Admitting: Neurology

## 2021-05-25 VITALS — BP 114/83 | HR 94 | Ht 66.0 in | Wt 163.5 lb

## 2021-05-25 DIAGNOSIS — H9311 Tinnitus, right ear: Secondary | ICD-10-CM | POA: Diagnosis not present

## 2021-05-25 DIAGNOSIS — R41840 Attention and concentration deficit: Secondary | ICD-10-CM | POA: Diagnosis not present

## 2021-05-25 DIAGNOSIS — R251 Tremor, unspecified: Secondary | ICD-10-CM | POA: Diagnosis not present

## 2021-05-25 DIAGNOSIS — R2689 Other abnormalities of gait and mobility: Secondary | ICD-10-CM | POA: Diagnosis not present

## 2021-05-25 DIAGNOSIS — R4189 Other symptoms and signs involving cognitive functions and awareness: Secondary | ICD-10-CM | POA: Diagnosis not present

## 2021-05-25 DIAGNOSIS — B0222 Postherpetic trigeminal neuralgia: Secondary | ICD-10-CM | POA: Diagnosis not present

## 2021-05-25 DIAGNOSIS — R4701 Aphasia: Secondary | ICD-10-CM | POA: Diagnosis not present

## 2021-05-25 MED ORDER — WELLMIND VERTIGO PO TABS: ORAL_TABLET | ORAL | 2 refills | Status: DC

## 2021-05-25 MED ORDER — PREGABALIN 25 MG PO CAPS: 25.0000 mg | ORAL_CAPSULE | Freq: Two times a day (BID) | ORAL | 2 refills | Status: DC

## 2021-05-25 NOTE — Patient Instructions (Signed)
Pregabalin Capsules What is this medication? PREGABALIN (pre GAB a lin) treats nerve pain. It may also be used to prevent and control seizures in people with epilepsy. It works by Production assistant, radio in Veterinary surgeon. This medicine may be used for other purposes; ask your health care provider orpharmacist if you have questions. COMMON BRAND NAME(S): Lyrica What should I tell my care team before I take this medication? They need to know if you have any of these conditions: Drug abuse or addiction Heart failure Kidney disease Lung disease Suicidal thoughts, plans or attempt An unusual or allergic reaction to pregabalin, other medications, foods, dyes, or preservatives Pregnant or trying to get pregnant Breast-feeding How should I use this medication? Take this medication by mouth with water. Take it as directed on the prescription label at the same time every day. You can take it with or without food. If it upsets your stomach, take it with food. Keep taking it unless yourcare team tells you to stop. A special MedGuide will be given to you by the pharmacist with eachprescription and refill. Be sure to read this information carefully each time. Talk to your care team about the use of this medication in children. While it may be prescribed for children as young as 1 month for selected conditions,precautions do apply. Overdosage: If you think you have taken too much of this medicine contact apoison control center or emergency room at once. NOTE: This medicine is only for you. Do not share this medicine with others. What if I miss a dose? If you miss a dose, take it as soon as you can. If it is almost time for yournext dose, take only that dose. Do not take double or extra doses. What may interact with this medication? Alcohol Antihistamines for allergy, cough, and cold Certain medications for anxiety or sleep Certain medications for blood pressure, heart disease Certain medications for  depression like amitriptyline, fluoxetine, sertraline Certain medications for diabetes, like pioglitazone, rosiglitazone Certain medications for seizures like phenobarbital, primidone General anesthetics like halothane, isoflurane, methoxyflurane, propofol Medications that relax muscles for surgery Narcotic medications for pain Phenothiazines like chlorpromazine, mesoridazine, prochlorperazine, thioridazine This list may not describe all possible interactions. Give your health care provider a list of all the medicines, herbs, non-prescription drugs, or dietary supplements you use. Also tell them if you smoke, drink alcohol, or use illegaldrugs. Some items may interact with your medicine. What should I watch for while using this medication? Visit your care team for regular checks on your progress. Tell your care teamif your symptoms do not start to get better or if they get worse. Do not suddenly stop taking this medication. You may develop a severe reaction. Your care team will tell you how much medication to take. If your care team wants you to stop the medication, the dose may be slowly lowered over time toavoid any side effects. You may get drowsy or dizzy. Do not drive, use machinery, or do anything that needs mental alertness until you know how this medication affects you. Do not stand up or sit up quickly, especially if you are an older patient. This reduces the risk of dizzy or fainting spells. Alcohol may interfere with theeffect of this medication. Avoid alcoholic drinks. If you or your family notice any changes in your behavior, such as new or worsening depression, thoughts of harming yourself, anxiety, other unusual ordisturbing thoughts, or memory loss, call your care team right away. Wear a medical ID bracelet or chain if you  are taking this medication for seizures. Carry a card that describes your condition. List the medications anddoses you take on the card. This medication may make it  more difficult to father a child. Talk to your careteam if you are concerned about your fertility. What side effects may I notice from receiving this medication? Side effects that you should report to your care team as soon as possible: Allergic reactions or angioedema-skin rash, itching, hives, swelling of the face, eyes, lips, tongue, arms, or legs, trouble swallowing or breathing Blurry vision Thoughts of suicide or self-harm, worsening mood, feelings of depression Trouble breathing Side effects that usually do not require medical attention (report to your careteam if they continue or are bothersome): Dizziness Drowsiness Dry mouth Nausea Swelling of the ankles, feet, hands Vomiting Weight gain This list may not describe all possible side effects. Call your doctor for medical advice about side effects. You may report side effects to FDA at1-800-FDA-1088. Where should I keep my medication? Keep out of the reach of children and pets. This medication can be abused. Keep it in a safe place to protect it from theft. Do not share it with anyone. It is only for you. Selling or giving away this medication is dangerous and againstthe law. Store at [employer] [address] (77 degrees F). Get rid of any unused medication afterthe expiration date. This medication may cause harm and death if it is taken by other adults, children, or pets. It is important to get rid of the medication as soon as youno longer need it, or it is expired. You can do this in two ways: Take the medication to a medication take-back program. Check with your pharmacy or law enforcement to find a location. If you cannot return the medication, check the label or package insert to see if the medication should be thrown out in the garbage or flushed down the toilet. If you are not sure, ask your care team. If it is safe to put it in the trash, take the medication out of the container. Mix the medication with cat litter, dirt, coffee grounds, or  other unwanted substance. Seal the mixture in a bag or container. Put it in the trash. NOTE: This sheet is a summary. It may not cover all possible information. If you have questions about this medicine, talk to your doctor, pharmacist, orhealth care provider.  2022 Elsevier/Gold Standard (2020-10-22 22:38:58)

## 2021-05-25 NOTE — Progress Notes (Signed)
NEUROLOGY CLINIC    Provider:  Larey Seat, MD  Primary Care Physician:  Hayden Rasmussen, MD [address] [place] [address]     Referring Provider: Jolene Provost, [address],  [place] [address]          Chief Complaint according to patient   Patient presents with:     New Patient (Initial Visit)     Pt referred by Chester Holstein for further evaluation concerning her balance.Pt states feeling like she is floating or on a boat. Patient has previously been seen for Insomnia, over 3 years ago. She reports having had shingles, neck and face - right side and was in severe pain.  Vertigo  has increased, memory concerns were mentioned.        HISTORY OF PRESENT ILLNESS:  MAHALA ROMMEL is a 75 y.o. Caucasian female patient seen here as a referral on 05/25/2021 from [place]- for a  balance and vertigo evaluation. .  Chief concern according to patient : Today referred by Chester Holstein for further evaluation concerning her balance.Pt states feeling like she is floating or on a boat. Patient has previously been seen for Insomnia, over 3 years ago. She reports having had shingles, neck and face - right side and was in severe pain.  Vertigo  has increased, memory concerns were mentioned.    05-25-2021: I have the pleasure of seeing DASHONDA BONNEAU , a right-handed Caucasian female with a possible sleep disorder.  She has a  has a past medical history of Anxiety, Depression, Insomnia, tinnitus, balance problems, shingles( facial ) , and allergies.    Social history:  Patient is retired from Radiographer, therapeutic-  and lives in a household with spouse,  Pets are not  present. Tobacco: uses none .  ETOH use rare , Caffeine intake in form of Coffee(  2 cups a day)  Soda(  1 can a day) Tea ( /) or energy drinks. Regular exercise in form of walking..   Hobbies :piano. Not even 30 minutes a day - she feels as if she plays on a boat- the piano  and her are in motion.      Review of Systems: Out of a complete 14 system review, the patient complains of only the following symptoms, and all other reviewed systems are negative.:  Fatigue, vertigo, tinnitus,  Insomnia    Vertigo may have exacerbated under pain medication whilst having shingles, tinnitus became worse in the right ear.  Social History   Socioeconomic History   Marital status: Married    Spouse name: [name]   Number of children: 2   Years of education: Not on file   Highest education level: Master's degree in piano performance - [employer], and  (e.g., MA, MS, MEng, MEd, MSW, MBA)  Occupational History   Not on file  Tobacco Use   Smoking status: Never   Smokeless tobacco: Never  Substance and Sexual Activity   Alcohol use: Yes    Alcohol/week: 2.0 standard drinks    Types: 2 Glasses of wine per week    Comment: 2 glass a day   Drug use: No   Sexual activity: Not on file  Other Topics Concern   Not on file  Social History Narrative   Live with husband   Right handed   Caffeine: 2 cups of coffee   Social Determinants of Health   Financial Resource Strain: Not  on file  Food Insecurity: Not on file  Transportation Needs: Not on file  Physical Activity: Not on file  Stress: Not on file  Social Connections: Not on file    Family History  Problem Relation Age of Onset   Cancer Mother        colon   Depression Mother    Diabetes Father    Depression Father    Depression Son    Depression Paternal Uncle     Past Medical History:  Diagnosis Date   Anxiety    Depression    Hayfever     Past Surgical History:  Procedure Laterality Date   CATARACT EXTRACTION W/PHACO Bilateral 2018   TONSILLECTOMY       Current Outpatient Medications on File Prior to Visit  Medication Sig Dispense Refill   ALPRAZolam (XANAX) 0.5 MG tablet take one tablet by mouth at bedtime and one tablet by mouth every day as needed for anxiety 180 tablet 1   doxepin  (SINEQUAN) 100 MG capsule Take 1 capsule (100 mg total) by mouth at bedtime. 90 capsule 1   FOLIC ACID PO Take by mouth.     OVER THE COUNTER MEDICATION 4 capsules daily. Calm CP, takes 1 breakfast, 1 at dinner and 2 at bedtime     OVER THE COUNTER MEDICATION Alpha gabba     Progesterone Micronized (PROGESTERONE PO) Take 2 capsules by mouth at bedtime. 1:1 SR compound medication at [facility].     traMADol (ULTRAM) 50 MG tablet Take 50 mg by mouth every 6 (six) hours as needed.     zolpidem (AMBIEN CR) 12.5 MG CR tablet Take 12.5 mg by mouth at bedtime as needed for sleep.     No current facility-administered medications on file prior to visit.    Allergies  Allergen Reactions   Dust Mite Extract    Latex Rash    Physical exam:  There were no vitals filed for this visit. There is no height or weight on file to calculate BMI.   Wt Readings from Last 3 Encounters:  02/02/18 138 lb (62.6 kg)  03/06/15 121 lb 12.8 oz (55.2 kg)  04/08/13 128 lb 9.6 oz (58.3 kg)     Ht Readings from Last 3 Encounters:  02/02/18 _0  (1.676 m)  03/06/15 5' 5.75" (1.67 m)  04/08/13 5' 7.6" (1.717 m)      General: The patient is awake, alert and appears not in acute distress. The patient is well groomed. Head: Normocephalic, atraumatic. Neck is supple.  Dental status: intact  Cardiovascular:  Regular rate and cardiac rhythm by pulse,  without distended neck veins. Respiratory: Lungs are clear to auscultation.  Skin:  With evidence of ankle edema,blanched hands -  Trunk: The patient's posture is erect.   Neurologic exam : The patient is awake and alert, oriented to place and time.   Memory subjective described as impaired.  Attention span & concentration ability appears normal.  Speech is not al fluent,  without dysarthria, with mild dysphonia and some word-finding problems, aphasia.  Mood and affect are anxious-   Cranial nerves: no loss of smell or taste reported  Pupils are equal and  briskly reactive to light. Funduscopic exam: deferred. She is status post cataract surgery ( 2015)  Extraocular movements in vertical and horizontal planes were intact and without nystagmus. No Diplopia. Visual fields by finger perimetry are intact. Hearing impaired but  bone conduction was symmetric- .   Right ear drum is  clear, fluid behind may have been cloudy. No blisters. Shingles- blisters healed off.   Facial sensation intact to fine touch.  Facial motor strength is symmetric and tongue and uvula move midline.  Neck ROM : rotation, tilt and flexion extension were normal for age and shoulder shrug was symmetrical.    Motor exam:  Symmetric bulk, tone and ROM.   Normal tone without cog -wheeling, symmetric grip strength . She has trouble to turn a key or open a jar.    Sensory:  Fine touch and vibration sensation were decreased in her toes. Puffy ankles.  Proprioception tested in the upper extremities was normal.   Coordination: Rapid alternating movements in the fingers/hands were of normal speed.  The Finger-to-nose maneuver was with action tremor.  Gait and station: Patient could rise unassisted from a seated position, walked without assistive device.  She walks very cautiously- and slow.  Stance is of normal width/ base and the patient turned with 4steps.  Toe and heel walk were deferred.  Deep tendon reflexes: in the  upper and lower extremities are symmetric and intact.  Babinski response was deferred      After spending a total time of  45 minutes face to face and additional time for physical and neurologic examination, review of laboratory studies,  personal review of imaging studies, reports and results of other testing and review of referral information / records as far as provided in visit, I have established the following assessments:  1) Mrs. Soder has been concerned about an increase instability a feeling of lack of balance as she walks her gait has become slower as she  is more cautious and I have noticed that she has at least a mild neuropathy that probably contributes partially to her unsteadiness.   She has reported the feeling of being on a rocking boat surface in the past when I have met her but now she states that there is she feels like she is in much worse with the or bigger waves I generating her balance problem now.  I am not sure of her herpetic neuralgia and the medication used to treat it are contributing but it is certainly a possibility as the shingles involved the right face from neck to T TMJ and through the ear.  Tinnitus has become significantly louder on the right ear with the infection.  2) she reports a decline in cognitive function a subjective slowing of memory.  She is certainly anxious about that as well, she has a quite noticeable action tremor but no resting tremor and the action tremor is simultaneously expressed at similar amplitudes in both hands this is not a parkinsonian tremor.     #3 chronic insomnia.     My Plan is to proceed with:  1) continue exercises that improve your gait stability.  2) I like to try a low dose of Lyrica to reduce the herpetic neuralgia pain and improve some tinnitus, hopefully.   3) GAD- stay on  alprazolam/ doxepin if that helps with anxiety and sleep.   4) Memory loss?  There is aphasia, word finding problems noted. I will order a brain MRI to rule out causes of vertigo in the cerebellum or small vessel disease. Etc.   I would like to thank  PA, Daymon Larsen, MD , [address] [place],  [place] [address] for allowing me to meet with and to take care of this pleasant patient.   In short, Kelseigh Diver  Mannor is presenting with worsening sensation of movement when not moving.   I plan to follow up either personally or through our NP within 3-4 month.   CC: I will share my notes with PCP .  Electronically signed by: Larey Seat, MD 05/25/2021 2:13 PM  [facility] [employer] Board certified by The [employer] of Sleep Medicine and Diplomate of the [employer] of Sleep Medicine. Board certified In Neurology through the [place], Fellow of the [employer] of Neurology. Medical Director of [employer].

## 2021-05-26 LAB — CBC WITH DIFFERENTIAL/PLATELET
Basophils Absolute: 0 10*3/uL (ref 0.0–0.2)
Basos: 1 %
EOS (ABSOLUTE): 0.1 10*3/uL (ref 0.0–0.4)
Eos: 2 %
Hematocrit: 39.5 % (ref 34.0–46.6)
Hemoglobin: 13.6 g/dL (ref 11.1–15.9)
Immature Grans (Abs): 0 10*3/uL (ref 0.0–0.1)
Immature Granulocytes: 0 %
Lymphocytes Absolute: 0.9 10*3/uL (ref 0.7–3.1)
Lymphs: 17 %
MCH: 34.1 pg — ABNORMAL HIGH (ref 26.6–33.0)
MCHC: 34.4 g/dL (ref 31.5–35.7)
MCV: 99 fL — ABNORMAL HIGH (ref 79–97)
Monocytes Absolute: 0.4 10*3/uL (ref 0.1–0.9)
Monocytes: 8 %
Neutrophils Absolute: 3.9 10*3/uL (ref 1.4–7.0)
Neutrophils: 72 %
Platelets: 244 10*3/uL (ref 150–450)
RBC: 3.99 x10E6/uL (ref 3.77–5.28)
RDW: 14.6 % (ref 11.7–15.4)
WBC: 5.5 10*3/uL (ref 3.4–10.8)

## 2021-05-26 LAB — COMPREHENSIVE METABOLIC PANEL
ALT: 18 IU/L (ref 0–32)
AST: 22 IU/L (ref 0–40)
Albumin/Globulin Ratio: 2.5 — ABNORMAL HIGH (ref 1.2–2.2)
Albumin: 4.8 g/dL — ABNORMAL HIGH (ref 3.7–4.7)
Alkaline Phosphatase: 118 IU/L (ref 44–121)
BUN/Creatinine Ratio: 21 (ref 12–28)
BUN: 16 mg/dL (ref 8–27)
Bilirubin Total: 0.2 mg/dL (ref 0.0–1.2)
CO2: 24 mmol/L (ref 20–29)
Calcium: 9.3 mg/dL (ref 8.7–10.3)
Chloride: 105 mmol/L (ref 96–106)
Creatinine, Ser: 0.77 mg/dL (ref 0.57–1.00)
Globulin, Total: 1.9 g/dL (ref 1.5–4.5)
Glucose: 98 mg/dL (ref 65–99)
Potassium: 5 mmol/L (ref 3.5–5.2)
Sodium: 143 mmol/L (ref 134–144)
Total Protein: 6.7 g/dL (ref 6.0–8.5)
eGFR: 80 mL/min/{1.73_m2} (ref >59)

## 2021-05-26 LAB — THYROID PANEL WITH TSH
Free Thyroxine Index: 1.2 (ref 1.2–4.9)
T3 Uptake Ratio: 21 % — ABNORMAL LOW (ref 24–39)
T4, Total: 5.9 ug/dL (ref 4.5–12.0)
TSH: 2.53 u[IU]/mL (ref 0.450–4.500)

## 2021-05-27 ENCOUNTER — Ambulatory Visit
Admission: RE | Admit: 2021-05-27 | Discharge: 2021-05-27 | Disposition: A | Discharge status: Home / Self Care | Payer: PPO | Source: Ambulatory Visit | Attending: Neurology | Admitting: Neurology

## 2021-05-27 ENCOUNTER — Other Ambulatory Visit: Payer: Self-pay

## 2021-05-27 DIAGNOSIS — H9311 Tinnitus, right ear: Secondary | ICD-10-CM

## 2021-05-27 DIAGNOSIS — R4701 Aphasia: Secondary | ICD-10-CM

## 2021-05-27 DIAGNOSIS — R2689 Other abnormalities of gait and mobility: Secondary | ICD-10-CM

## 2021-05-27 DIAGNOSIS — R4189 Other symptoms and signs involving cognitive functions and awareness: Secondary | ICD-10-CM

## 2021-05-27 DIAGNOSIS — R41840 Attention and concentration deficit: Secondary | ICD-10-CM

## 2021-05-27 DIAGNOSIS — B0222 Postherpetic trigeminal neuralgia: Secondary | ICD-10-CM

## 2021-05-27 MED ORDER — GADOBENATE DIMEGLUMINE 529 MG/ML IV SOLN
15.0000 mL | Freq: Once | INTRAVENOUS | Status: AC | PRN
  Administered 2021-05-27: 15 mL via INTRAVENOUS

## 2021-05-27 NOTE — Progress Notes (Signed)
T3 reduced uptake ratio, MCV and MCH  elevated , albumin elevated, not dehydrated.  Recent shingles.  Lets start multivitamin with b complex.

## 2021-05-29 NOTE — Progress Notes (Signed)
Referral by Radonna Ricker, PA-ENT CLINICAL History : Zoster oticus, postherpetic neuralgia.  She reports having had shingles, right neck and face - right side was in severe pain. Tinnitus and Ear pain too. Vertigo sensation described as being on a moving boat, rocking. Vertigo has increased.   IMPRESSION: Slightly abnormal MRI scan of the brain with and without contrast showing only age-related changes of chronic small vessel disease and mild generalized atrophy.  INTERPRETING PHYSICIAN: Antony Contras, MD Exam Ended: 05/27/21 12:15 Last Resulted: 05/28/21 18:24

## 2021-05-31 ENCOUNTER — Encounter: Payer: Self-pay | Admitting: Neurology

## 2021-06-01 ENCOUNTER — Other Ambulatory Visit: Payer: Self-pay | Admitting: Neurology

## 2021-06-01 ENCOUNTER — Encounter: Payer: Self-pay | Admitting: Neurology

## 2021-06-02 ENCOUNTER — Telehealth: Payer: Self-pay

## 2021-06-02 NOTE — Telephone Encounter (Signed)
We received a call from this patient asking if Dr Brett Fairy or her nurse would be able to reach out around 2:45 or 3:00 today to speak with the patient and her husband regarding some test results she received.

## 2021-06-05 DIAGNOSIS — M9902 Segmental and somatic dysfunction of thoracic region: Secondary | ICD-10-CM | POA: Diagnosis not present

## 2021-06-05 DIAGNOSIS — M5030 Other cervical disc degeneration, unspecified cervical region: Secondary | ICD-10-CM | POA: Diagnosis not present

## 2021-06-05 DIAGNOSIS — M9903 Segmental and somatic dysfunction of lumbar region: Secondary | ICD-10-CM | POA: Diagnosis not present

## 2021-06-05 DIAGNOSIS — M5136 Other intervertebral disc degeneration, lumbar region: Secondary | ICD-10-CM | POA: Diagnosis not present

## 2021-06-05 DIAGNOSIS — M9901 Segmental and somatic dysfunction of cervical region: Secondary | ICD-10-CM | POA: Diagnosis not present

## 2021-06-05 DIAGNOSIS — M9904 Segmental and somatic dysfunction of sacral region: Secondary | ICD-10-CM | POA: Diagnosis not present

## 2021-06-07 NOTE — Telephone Encounter (Signed)
That is in the midst of my clinic day- wont work.

## 2021-06-09 ENCOUNTER — Telehealth: Payer: Self-pay | Admitting: Neurology

## 2021-06-09 NOTE — Telephone Encounter (Signed)
Pt's husband Angie Liu called stating that the pt has been calling and has not received a call back and she is needing to discuss symptoms she is having. Please advise.

## 2021-06-09 NOTE — Telephone Encounter (Signed)
Called the pt back and she is wanting a sooner apt. She states that she has sensation in a boat like going up/down. Her feet/legs are more swollen. She is due to follow up from testing. Advised that there is a opening on 11th at 2:30 with check in of 2. Was able to get her scheduled for that.

## 2021-06-10 DIAGNOSIS — M9902 Segmental and somatic dysfunction of thoracic region: Secondary | ICD-10-CM | POA: Diagnosis not present

## 2021-06-10 DIAGNOSIS — M5030 Other cervical disc degeneration, unspecified cervical region: Secondary | ICD-10-CM | POA: Diagnosis not present

## 2021-06-10 DIAGNOSIS — M9904 Segmental and somatic dysfunction of sacral region: Secondary | ICD-10-CM | POA: Diagnosis not present

## 2021-06-10 DIAGNOSIS — M9903 Segmental and somatic dysfunction of lumbar region: Secondary | ICD-10-CM | POA: Diagnosis not present

## 2021-06-10 DIAGNOSIS — M5136 Other intervertebral disc degeneration, lumbar region: Secondary | ICD-10-CM | POA: Diagnosis not present

## 2021-06-10 DIAGNOSIS — M9901 Segmental and somatic dysfunction of cervical region: Secondary | ICD-10-CM | POA: Diagnosis not present

## 2021-06-11 ENCOUNTER — Encounter: Payer: Self-pay | Admitting: Neurology

## 2021-06-11 ENCOUNTER — Ambulatory Visit: Payer: PPO | Admitting: Neurology

## 2021-06-11 ENCOUNTER — Other Ambulatory Visit: Payer: Self-pay

## 2021-06-11 VITALS — BP 115/76 | HR 100 | Ht 66.0 in | Wt 163.5 lb

## 2021-06-11 DIAGNOSIS — H9311 Tinnitus, right ear: Secondary | ICD-10-CM

## 2021-06-11 DIAGNOSIS — B0222 Postherpetic trigeminal neuralgia: Secondary | ICD-10-CM | POA: Diagnosis not present

## 2021-06-11 NOTE — Patient Instructions (Signed)
EFFEXOR ?

## 2021-06-11 NOTE — Progress Notes (Signed)
NEUROLOGY CLINIC    Provider:  Larey Seat, MD  Primary Care Physician:  Pcp, No No address on file     Referring Provider: Hayden Rasmussen, [place] [employer] [place],  [place] [id number]          Chief Complaint according to patient   Patient presents with:     New Patient (Initial Visit)     Pt referred by Chester Holstein for further evaluation concerning her balance.Pt states feeling like she is floating or on a boat. Patient has previously been seen for Insomnia, over 3 years ago. She reports having had shingles, neck and face - right side and was in severe pain.  Vertigo  has increased, memory concerns were mentioned.        INTERVAL History: RV on  06/11/2021 ;   The Brain MRI overall was good showing only age related changes. There is evidence of chronic small vessel disease, which is a common finding as we age. There was also evidence of mild generalized atrophy, where the brain begins to shrink. This is another common finding as we age.   She keeps taking homoeopathy medications. Has postherpetic neuralgic pain. She has waited to get shigles shots. She has improved her eye sight. Uses eye drops.  Lyrica , even in low dose, made her more dizzy, she d/c it.  MCV , T3 uptake and albumin level discussed.  Tinnitus : I sent a referral to hearing test at [facility] . [phone] fax.  Doxepin for sleep.    HISTORY OF PRESENT ILLNESS:  Angie Liu is a 75 y.o. Caucasian female patient was seen originally here upon referral from [place]- for a  balance and vertigo evaluation. Angie Liu and Dr.Wolicki are both no longer practicing.   Chief concern according to patient : Today referred by Chester Holstein for further evaluation concerning her balance.Pt states feeling like she is floating or on a boat. Patient has previously been seen for Insomnia, over 3 years ago. She reports having had shingles, neck and face - right side and was in severe pain.   Vertigo  has increased, memory concerns were mentioned.    05-25-2021: I have the pleasure of seeing Angie Liu , a right-handed Caucasian female with a possible sleep disorder.  She has a  has a past medical history of Anxiety, Depression, Insomnia, tinnitus, balance problems, shingles( facial ) , and allergies.    Social history:  Patient is retired from Radiographer, therapeutic-  and lives in a household with spouse,  Pets are not  present. Tobacco: uses none .  ETOH use rare , Caffeine intake in form of Coffee(  2 cups a day)  Soda(  1 can a day) Tea ( /) or energy drinks. Regular exercise in form of walking..   Hobbies :piano. Not even 30 minutes a day - she feels as if she plays on a boat- the piano and her are in motion.      Review of Systems: Out of a complete 14 system review, the patient complains of only the following symptoms, and all other reviewed systems are negative.:  Fatigue, vertigo, tinnitus,  Insomnia    Vertigo may have exacerbated under pain medication whilst having shingles, tinnitus became worse in the right ear.  Social History   Socioeconomic History   Marital status: Married    Spouse name: [name]   Number of children: 2   Years of education:  Not on file   Highest education level: Master's degree in piano performance - [employer], and  (e.g., MA, MS, MEng, MEd, MSW, MBA)  Occupational History   Not on file  Tobacco Use   Smoking status: Never   Smokeless tobacco: Never  Substance and Sexual Activity   Alcohol use: Yes    Alcohol/week: 2.0 standard drinks    Types: 2 Glasses of wine per week    Comment: 2 glass a day   Drug use: No   Sexual activity: Not on file  Other Topics Concern   Not on file  Social History Narrative   Live with husband   Right handed   Caffeine: 2 cups of coffee   Social Determinants of Health   Financial Resource Strain: Not on file  Food Insecurity: Not on file  Transportation Needs: Not on file  Physical Activity: Not on  file  Stress: Not on file  Social Connections: Not on file    Family History  Problem Relation Age of Onset   Cancer Mother        colon   Depression Mother    Diabetes Father    Depression Father    Depression Son    Depression Paternal Uncle     Past Medical History:  Diagnosis Date   Anxiety    Depression    Hayfever     Past Surgical History:  Procedure Laterality Date   CATARACT EXTRACTION W/PHACO Bilateral 2018   TONSILLECTOMY       Current Outpatient Medications on File Prior to Visit  Medication Sig Dispense Refill   ALPRAZolam (XANAX) 0.5 MG tablet take one tablet by mouth at bedtime and one tablet by mouth every day as needed for anxiety 180 tablet 1   doxepin (SINEQUAN) 100 MG capsule Take 1 capsule (100 mg total) by mouth at bedtime. 90 capsule 1   FOLIC ACID PO Take by mouth.     OVER THE COUNTER MEDICATION 4 capsules daily. Calm CP, takes 1 breakfast, 1 at dinner and 2 at bedtime     OVER THE COUNTER MEDICATION Alpha gabba     pregabalin (LYRICA) 25 MG capsule Take 1 capsule (25 mg total) by mouth 2 (two) times daily. 90 capsule 2   Progesterone Micronized (PROGESTERONE PO) Take 2 capsules by mouth at bedtime. 1:1 SR compound medication at [facility].     traMADol (ULTRAM) 50 MG tablet Take 50 mg by mouth every 6 (six) hours as needed.     zolpidem (AMBIEN CR) 12.5 MG CR tablet Take 12.5 mg by mouth at bedtime as needed for sleep.     No current facility-administered medications on file prior to visit.    Allergies  Allergen Reactions   Dust Mite Extract    Latex Rash    Physical exam:  Today's Vitals   06/11/21 1432  BP: 115/76  Pulse: 100  Weight: 163 lb 8 oz (74.2 kg)  Height: 5' 6" (1.676 m)   Body mass index is 26.39 kg/m.   Wt Readings from Last 3 Encounters:  06/11/21 163 lb 8 oz (74.2 kg)  05/25/21 163 lb 8 oz (74.2 kg)  02/02/18 138 lb (62.6 kg)     Ht Readings from Last 3 Encounters:  06/11/21 5' 6" (1.676 m)  05/25/21 5' 6"  (1.676 m)  02/02/18 5' 6" (1.676 m)      General: The patient is awake, alert and appears not in acute distress. The patient is well  groomed. Head: Normocephalic, atraumatic. Neck is supple.  Dental status: intact  Cardiovascular:  Regular rate and cardiac rhythm by pulse,  without distended neck veins. Respiratory: Lungs are clear to auscultation.  Skin:  With evidence of ankle edema,blanched hands -  Trunk: The patient's posture is erect.   Neurologic exam : The patient is awake and alert, oriented to place and time.   Memory subjective described as impaired.  Attention span & concentration ability appears normal.  Speech is not al fluent,  without dysarthria, with mild dysphonia and some word-finding problems, aphasia.  Mood and affect are anxious-   Cranial nerves: no loss of smell or taste reported  Pupils are equal and briskly reactive to light. Funduscopic exam: deferred. She is status post cataract surgery ( 2015)  Extraocular movements in vertical and horizontal planes were intact and without nystagmus. No Diplopia. Visual fields by finger perimetry are intact. Hearing impaired but  bone conduction was symmetric- .   Right ear drum is clear, fluid behind may have been cloudy. No blisters. Shingles- blisters healed off.   Facial sensation intact to fine touch.  Facial motor strength is symmetric and tongue and uvula move midline.  Neck ROM : rotation, tilt and flexion extension were normal for age and shoulder shrug was symmetrical.    Motor exam:  Symmetric bulk, tone and ROM.   Normal tone without cog -wheeling, symmetric grip strength . She has trouble to turn a key or open a jar.    Sensory:  Fine touch and vibration sensation were decreased in her toes. Puffy ankles.  Proprioception tested in the upper extremities was normal.   Coordination: Rapid alternating movements in the fingers/hands were of normal speed.  The Finger-to-nose maneuver was with action tremor.   Gait and station: Patient could rise unassisted from a seated position, walked without assistive device.  She walks very cautiously- and slow.  Stance is of normal width/ base and the patient turned with 4steps.  Toe and heel walk were deferred.  Deep tendon reflexes: in the  upper and lower extremities are symmetric and intact.  Babinski response was deferred      After spending a total time of  45 minutes face to face and additional time for physical and neurologic examination, review of laboratory studies,  personal review of imaging studies, reports and results of other testing and review of referral information / records as far as provided in visit, I have established the following assessments:  1) Angie. Yohannes has been concerned about an increase instability a feeling of lack of balance as she walks her gait has become slower as she is more cautious and I have noticed that she has at least a mild neuropathy that probably contributes partially to her unsteadiness.   She has reported the feeling of being on a rocking boat surface in the past when I have met her but now she states that there is she feels like she is in much worse with the or bigger waves I generating her balance problem now.   I am not sure of her herpetic neuralgia and the medication used to treat it are contributing but it is certainly a possibility as the shingles involved the right face from neck to T TMJ and through the ear.  Tinnitus has become significantly louder on the right ear with the infection.  MRI brain was normal.     My Plan is to proceed with:  1) continue exercises that improve your gait  stability.take a prenatal vitamin or B supplementing.   2) may like to try low dose of Effexor - encouraged the patient to speak to PCP about this. Lyrica was not tolerated.  3) GAD- stay on  alprazolam/ doxepin if that helps with anxiety and sleep.   I would like to thank  PA, Nordblath, Pcp, No , [address] [place],  [place] [address] for allowing me to meet with and to take care of this pleasant patient.   In short, ORIANNA BISKUP is presenting with improving vision and same vertigo sensation.   I plan to follow up prn either personally or through our NP .   CC: I will share my notes with PCP .  Electronically signed by: Larey Seat, MD 06/11/2021 3:13 PM  [facility] Board certified by The [employer] of Sleep Medicine and Diplomate of the [employer] of Sleep Medicine. Board certified In Neurology through the [place], Fellow of the [employer] of Neurology. Medical Director of [facility].

## 2021-06-18 ENCOUNTER — Telehealth: Payer: Self-pay

## 2021-06-18 NOTE — Telephone Encounter (Signed)
Patient called to check on her outgoing referral to [facility], she passed along their fax [phone] and wanted a call back with an update.  Thank you!

## 2021-06-18 NOTE — Telephone Encounter (Signed)
Looks like Dr. Brett Fairy put in this referral 06/11/21. Pt called back to provide fax #

## 2021-06-18 NOTE — Telephone Encounter (Signed)
Referral faxed to number patient provided. I called patient and LVM to let her know.

## 2021-06-22 ENCOUNTER — Ambulatory Visit: Payer: PPO | Admitting: Psychiatry

## 2021-06-22 DIAGNOSIS — M9902 Segmental and somatic dysfunction of thoracic region: Secondary | ICD-10-CM | POA: Diagnosis not present

## 2021-06-22 DIAGNOSIS — M5136 Other intervertebral disc degeneration, lumbar region: Secondary | ICD-10-CM | POA: Diagnosis not present

## 2021-06-22 DIAGNOSIS — M9904 Segmental and somatic dysfunction of sacral region: Secondary | ICD-10-CM | POA: Diagnosis not present

## 2021-06-22 DIAGNOSIS — M9903 Segmental and somatic dysfunction of lumbar region: Secondary | ICD-10-CM | POA: Diagnosis not present

## 2021-06-22 DIAGNOSIS — M9901 Segmental and somatic dysfunction of cervical region: Secondary | ICD-10-CM | POA: Diagnosis not present

## 2021-06-22 DIAGNOSIS — M5030 Other cervical disc degeneration, unspecified cervical region: Secondary | ICD-10-CM | POA: Diagnosis not present

## 2021-06-23 NOTE — Telephone Encounter (Signed)
Pt called stating that [facility] has not received the fax and she would like to know if it can be resent again. Please advise.

## 2021-06-24 NOTE — Telephone Encounter (Signed)
I called [facility], spoke w/ Angie Liu, she states they did receive the fax and will be in contact w/ the patient when they are ready to schedule. Phone: [phone].

## 2021-07-01 DIAGNOSIS — Z1231 Encounter for screening mammogram for malignant neoplasm of breast: Secondary | ICD-10-CM | POA: Diagnosis not present

## 2021-07-01 LAB — HM MAMMOGRAPHY

## 2021-07-02 ENCOUNTER — Encounter: Payer: Self-pay | Admitting: Family Medicine

## 2021-07-02 NOTE — Progress Notes (Signed)
Mamm Abstracted.

## 2021-07-10 ENCOUNTER — Ambulatory Visit: Payer: PPO | Admitting: Psychiatry

## 2021-07-10 ENCOUNTER — Encounter: Payer: Self-pay | Admitting: Psychiatry

## 2021-07-10 ENCOUNTER — Other Ambulatory Visit: Payer: Self-pay

## 2021-07-10 DIAGNOSIS — F411 Generalized anxiety disorder: Secondary | ICD-10-CM | POA: Diagnosis not present

## 2021-07-10 DIAGNOSIS — F331 Major depressive disorder, recurrent, moderate: Secondary | ICD-10-CM | POA: Diagnosis not present

## 2021-07-10 MED ORDER — VENLAFAXINE HCL ER 37.5 MG PO CP24: 37.5000 mg | ORAL_CAPSULE | Freq: Every day | ORAL | 1 refills | Status: DC

## 2021-07-10 NOTE — Progress Notes (Signed)
Angie Liu UR:[id number] 1946/08/24 75 y.o.  Subjective:   Patient ID:  Angie Liu is a 75 y.o. (DOB 1946-03-15) female.  Chief Complaint:  Chief Complaint  Patient presents with   Anxiety   Depression    HPI Angie Liu presents to the office today for follow-up of anxiety, depression, and insomnia. Seen emergently for worsening depression.   "Really dark depression, usually in the morning." She reports that she has been having significant fatigue. She reports that she is no longer enjoying previous interests and activities, such as watching tennis on TV.  Has not been enjoying playing piano. Motivation has been low. She reports adequate sleep with medication, "without it, I wouldn't sleep." She describes appetite as "too good." Denies SI.   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 Reports counting the time she is in the shower or counting stairs- "it just happens, I don't really think about it" and denies that this is compulsive. She reports a "fear of falling." She reports anxiety. She reports that she has been experiencing worry.   She reports, "my memory isn't as good." Recent Brain MRI was normal aside from showing only age related changes. Reports that she had concentration difficulties while she had shingles.   She reports having a sensation that she is "floating in a boat" since she had shingles.   Reports that she and her husband have been "arguing more." They recently celebrated their 55th anniversary.   Reports son Angie Liu has lived in [place] long-term with his partner, Angie Liu, who has a Therapist, art position. They moved to [facility] and son is looking for work.   Past Psychotropic Medication Trials: Lyrica Ambien Alprazolam  Doxepin- Has been ineffective at 25 mg. Noticed some slight improvement in sleep initiation and maintenance with increase to 50 mg po QHS. Started 25 mg on 09/26/17.   Hydroxyzine- Ineffective Xanax Ambien CR- Has been most helpful Ambien Sonata Rozerem- Ineffective Trileptal- Took for several years Remeron Trazodone Bupropion Lexapro- Helpful many years ago but did not respond more recently. Wellbutrin- Tremor Luvox- Took 50 mg po qd Prozac- took briefly and did not respond well Zoloft- "remember I didn't like it." Cymbalta- May have caused tremors Has not taken Celexa, Buspar, Effexor Abilify, Lamictal, Seroquel, Olanzapine   PHQ2-9    [place] Office Visit from 03/06/2015 in [facility] Care at [facility] Total Score 0        Review of Systems:  Review of Systems  HENT:  Positive for tinnitus.   Cardiovascular:  Positive for leg swelling.  Musculoskeletal:  Negative for gait problem.  Neurological:        Vertigo/ataxia. Neuropathy  Psychiatric/Behavioral:         Please refer to HPI  Notices she sleeps better when she takes Progesterone and has reduced this due to cost.   Has apt to see Dr. Janett Billow Liu to establish primary care.   Medications: I have reviewed the patient's current medications.  Current Outpatient Medications  Medication Sig Dispense Refill   ALPRAZolam (XANAX) 0.5 MG tablet take one tablet by mouth at bedtime and one tablet by mouth every day as needed for anxiety 180 tablet 1   doxepin (SINEQUAN) 100 MG capsule Take 1 capsule (100 mg total) by mouth at bedtime. 90 capsule 1   OVER THE COUNTER MEDICATION 4 capsules daily. Calm CP, takes 1 breakfast, 1 at dinner and 2  at bedtime     OVER THE COUNTER MEDICATION Alpha gabba     Progesterone Micronized (PROGESTERONE PO) Take 2 capsules by mouth at bedtime. 1:1 SR compound medication at [facility].     venlafaxine XR (EFFEXOR XR) 37.5 MG 24 hr capsule Take 1 capsule (37.5 mg total) by mouth daily with breakfast. 30 capsule 1   zolpidem (AMBIEN CR) 12.5 MG CR tablet Take 12.5 mg by mouth at bedtime as needed for sleep.     FOLIC ACID PO Take by mouth.  (Patient not taking: Reported on 07/10/2021)     pregabalin (LYRICA) 25 MG capsule Take 1 capsule (25 mg total) by mouth 2 (two) times daily. (Patient not taking: Reported on 07/10/2021) 90 capsule 2   traMADol (ULTRAM) 50 MG tablet Take 50 mg by mouth every 6 (six) hours as needed. (Patient not taking: Reported on 07/10/2021)     No current facility-administered medications for this visit.    Medication Side Effects: Other: Increased thirst  Allergies:  Allergies  Allergen Reactions   Dust Mite Extract    Latex Rash    Past Medical History:  Diagnosis Date   Anxiety    Depression    Hayfever     Past Medical History, Surgical history, Social history, and Family history were reviewed and updated as appropriate.   Please see review of systems for further details on the patient's review from today.   Objective:   Physical Exam:  There were no vitals taken for this visit.  Physical Exam Constitutional:      General: She is not in acute distress. Musculoskeletal:        General: No deformity.  Neurological:     Mental Status: She is alert and oriented to person, place, and time.     Coordination: Coordination normal.  Psychiatric:        Attention and Perception: Attention and perception normal. She does not perceive auditory or visual hallucinations.        Mood and Affect: Mood is anxious and depressed. Affect is not labile, blunt, angry or inappropriate.        Speech: Speech normal.        Behavior: Behavior normal.        Thought Content: Thought content normal. Thought content is not paranoid or delusional. Thought content does not include homicidal or suicidal ideation. Thought content does not include homicidal or suicidal plan.        Cognition and Memory: Cognition and memory normal.        Judgment: Judgment normal.     Comments: Insight intact    Lab Review:     Component Value Date/Time   NA 143 05/25/2021 1517   K 5.0 05/25/2021 1517   CL 105 05/25/2021  1517   CO2 24 05/25/2021 1517   GLUCOSE 98 05/25/2021 1517   BUN 16 05/25/2021 1517   CREATININE 0.77 05/25/2021 1517   CALCIUM 9.3 05/25/2021 1517   PROT 6.7 05/25/2021 1517   ALBUMIN 4.8 (H) 05/25/2021 1517   AST 22 05/25/2021 1517   ALT 18 05/25/2021 1517   ALKPHOS 118 05/25/2021 1517   BILITOT 0.2 05/25/2021 1517       Component Value Date/Time   WBC 5.5 05/25/2021 1517   RBC 3.99 05/25/2021 1517   HGB 13.6 05/25/2021 1517   HCT 39.5 05/25/2021 1517   PLT 244 05/25/2021 1517   MCV 99 (H) 05/25/2021 1517   MCH 34.1 (H) 05/25/2021 1517  MCHC 34.4 05/25/2021 1517   RDW 14.6 05/25/2021 1517   LYMPHSABS 0.9 05/25/2021 1517   EOSABS 0.1 05/25/2021 1517   BASOSABS 0.0 05/25/2021 1517    No results found for: POCLITH, LITHIUM   No results found for: PHENYTOIN, PHENOBARB, VALPROATE, CBMZ   .res Assessment: Plan:   Patient seen for 30 minutes and time spent discussing recent worsening in anxiety and depressive signs and symptoms.  Discussed possible treatment options for depression and anxiety, to include switching to an SNRI since she has had limited improvement with SSRIs in the past.  Reviewed recommendations from Dr. Brett Fairy and discussed potential benefits, risks, and side effects of Effexor XR.  Discussed that Effexor XR is approved for anxiety and depression and may be used off label for neuropathy.  Patient agrees to trial of Effexor XR.  Recommended starting Effexor XR 37.5 mg daily for 1 week and then increasing to 75 mg daily.  Patient reports that she would prefer to start with 37.5 mg dose and continue this for 1 month and then reevaluate.  We will start Effexor XR 37.5 mg daily for depression and anxiety. Recommended dose reduction of doxepin to 75 mg at bedtime to reduce polypharmacy and risk of side effects.  Patient reports that she prefers to continue doxepin 100 mg at bedtime since she has experienced significant sleep disturbance with doxepin decreases.  We will  continue doxepin 100 mg at bedtime. Continue Ambien CR for insomnia. Continue alprazolam as needed for anxiety. Patient to follow-up in 1 month or sooner if clinically indicated. Patient advised to contact office with any questions, adverse effects, or acute worsening in signs and symptoms.    Tanner was seen today for anxiety and depression.  Diagnoses and all orders for this visit:  Generalized anxiety disorder -     venlafaxine XR (EFFEXOR XR) 37.5 MG 24 hr capsule; Take 1 capsule (37.5 mg total) by mouth daily with breakfast.  Moderate recurrent major depression (HCC) -     venlafaxine XR (EFFEXOR XR) 37.5 MG 24 hr capsule; Take 1 capsule (37.5 mg total) by mouth daily with breakfast.    Please see After Visit Summary for patient specific instructions.  Future Appointments  Date Time Provider [place]  07/22/2021 10:30 AM Rankin, Clent Demark, MD [facility] None  07/23/2021  1:00 PM Liu, Gay Filler, MD [facility]  07/28/2021  1:30 PM Rankin, Clent Demark, MD RDE-RDE None  08/06/2021 11:00 AM Thayer Headings, PMHNP CP-CP None    No orders of the defined types were placed in this encounter.   -------------------------------

## 2021-07-20 DIAGNOSIS — H903 Sensorineural hearing loss, bilateral: Secondary | ICD-10-CM | POA: Diagnosis not present

## 2021-07-21 NOTE — Progress Notes (Signed)
[place] at [facility] [address], [place], [place] [address] [phone]  Date:  07/23/2021   Name:  Angie Liu   DOB:  [birth year]   MRN:  [id number]  PCP:  Darreld Mclean, MD    Chief Complaint: New Patient (Initial Visit) (Concerns/ questions: 1. pt says her progesterone is expensive, 2. Insomnia 3. Pt would like to know when she can take the Shingles shot. )   History of Present Illness:  Angie Liu is a 75 y.o. very pleasant female patient who presents with the following:  Patient seen today as a new patient, her husband [name] is my patient She grew up in [place], her her husband in college She is a retired Radiographer, therapeutic  She is seen by neurology for concern of vertigo and balance problems, tinnitus, possible herpetic trigeminal neuralgia She is seen by Thayer Headings for mental health concerns  Her optho is Dr Carolyn Stare and she sees Dr Zadie Rhine for her retinal issues   She had shingles in May She is taking progesterone by mouth 50 mg BID- this is per Dr Marina Gravel her "wellness rx"- however this provider is moving away and she wonders if I can take this over and also change to [facility] where apparently she can get it more cheaply   She is not on estrogen  Mammo is UTD Her colon is every 5 years per [facility] - I will request these reports   Flu shot today  She had a terrible outbreak of shingles over the summer- this has gotten a lot better.  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   Exudative age-related  macular degeneration of right eye with active choroidal neovascularization ([place]) 06/04/2020   Exudative age-related macular degeneration of left eye with active choroidal neovascularization ([place]) 02/07/2020   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   Posterior vitreous detachment of left eye 02/07/2020   Intermediate stage nonexudative age-related macular degeneration of left eye 02/07/2020   Intermediate stage nonexudative age-related macular degeneration of right eye 02/07/2020   GAD (generalized anxiety disorder) 11/28/2018   Recurrent major depression in partial remission ([place]) 11/28/2018   Chronic insomnia 02/02/2018   Depression 02/02/2018   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   Excessive cerumen in both ear canals 10/17/2017   Sensorineural hearing loss (SNHL), 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   Tinnitus of both ears 10/17/2017   Bilateral impacted cerumen 08/05/2016   Visit for screening mammogram 07/18/2014   Allergic rhinitis, seasonal 10/23/2012   Depression with anxiety 10/23/2012    Past Medical History:  Diagnosis Date   Anxiety    Depression    Hayfever     Past Surgical History:  Procedure Laterality Date   CATARACT EXTRACTION W/PHACO Bilateral 2018   TONSILLECTOMY      Social History   Tobacco Use   Smoking status: Never   Smokeless tobacco: Never  Substance Use Topics   Alcohol use: Yes    Alcohol/week: 2.0 standard drinks    Types: 2 Glasses  of wine per week    Comment: 2 glass a day   Drug use: No    Family History  Problem Relation Age of Onset   Cancer Mother        colon   Depression Mother    Diabetes Father    Depression Father    Depression Son    Depression Paternal Uncle     Allergies  Allergen Reactions   Dust Mite Extract    Latex Rash    Medication list has been reviewed and updated.  Current Outpatient Medications on File Prior to Visit  Medication Sig Dispense Refill   ALPRAZolam (XANAX) 0.5 MG  tablet take one tablet by mouth at bedtime and one tablet by mouth every day as needed for anxiety 180 tablet 1   b complex vitamins capsule Take 1 capsule by mouth daily.     doxepin (SINEQUAN) 100 MG capsule Take 1 capsule (100 mg total) by mouth at bedtime. 90 capsule 1   FOLIC ACID PO Take by mouth.     Multiple Vitamin (MULTIVITAMIN) capsule Take 1 capsule by mouth daily.     OVER THE COUNTER MEDICATION 4 capsules daily. Calm CP, takes 1 breakfast, 1 at dinner and 2 at bedtime     OVER THE COUNTER MEDICATION Alpha gabba     pregabalin (LYRICA) 25 MG capsule Take 1 capsule (25 mg total) by mouth 2 (two) times daily. 90 capsule 2   Progesterone Micronized (PROGESTERONE PO) Take 2 capsules by mouth at bedtime. 1:1 SR compound medication at [facility].     QUERCETIN PO Take by mouth.     traMADol (ULTRAM) 50 MG tablet Take 50 mg by mouth every 6 (six) hours as needed.     venlafaxine XR (EFFEXOR XR) 37.5 MG 24 hr capsule Take 1 capsule (37.5 mg total) by mouth daily with breakfast. 30 capsule 1   zolpidem (AMBIEN CR) 12.5 MG CR tablet Take 12.5 mg by mouth at bedtime as needed for sleep.     No current facility-administered medications on file prior to visit.    Review of Systems:  As per HPI- otherwise negative.   Physical Examination: Vitals:   07/23/21 1304  BP: 124/72  Pulse: (!) 111  Resp: 18  Temp: 98.4 F (36.9 C)  SpO2: 96%   Vitals:   07/23/21 1304  Weight: 163 lb (73.9 kg)  Height: 5\' 6"  (1.676 m)   Body mass index is 26.31 kg/m. Ideal Body Weight: Weight in (lb) to have BMI = 25: 154.6  GEN: no acute distress.  Normal weight, looks well HEENT: Atraumatic, Normocephalic.  No visible shingles rash remains Ears and Nose: No external deformity. CV: RRR, No M/G/R. No JVD. No thrill. No extra heart sounds. PULM: CTA B, no wheezes, crackles, rhonchi. No retractions. No resp. distress. No accessory muscle use. ABD: S, NT, ND, +BS. No rebound. No HSM. EXTR: No  c/c/e PSYCH: Normally interactive. Conversant.    Assessment and Plan: Encounter for medical examination to establish care  Need for influenza vaccination - Plan: Flu Vaccine QUAD High Dose(Fluad)  Hormone replacement therapy  Immunization due  Patient seen today to establish care.  She is also followed by neurology and psychiatry She has been getting progesterone, compounded by [facility].  Currently taking 50 mg twice daily.  She is interested in changing over to a generic progesterone available at [facility]; this is supposed to help with insomnia.  I will work on these  details for her  Flu vaccine given Discussed health maintenance  Advised that she may have the Shingrix vaccine at her convenience She has plans to get the new COVID booster soon   Marianna for [place], MD

## 2021-07-22 ENCOUNTER — Encounter (INDEPENDENT_AMBULATORY_CARE_PROVIDER_SITE_OTHER): Payer: Self-pay | Admitting: Ophthalmology

## 2021-07-22 ENCOUNTER — Ambulatory Visit (INDEPENDENT_AMBULATORY_CARE_PROVIDER_SITE_OTHER): Payer: PPO | Admitting: Ophthalmology

## 2021-07-22 ENCOUNTER — Other Ambulatory Visit: Payer: Self-pay

## 2021-07-22 DIAGNOSIS — H353211 Exudative age-related macular degeneration, right eye, with active choroidal neovascularization: Secondary | ICD-10-CM

## 2021-07-22 DIAGNOSIS — H353221 Exudative age-related macular degeneration, left eye, with active choroidal neovascularization: Secondary | ICD-10-CM | POA: Diagnosis not present

## 2021-07-22 MED ORDER — AFLIBERCEPT 2MG/0.05ML IZ SOLN FOR KALEIDOSCOPE
2.0000 mg | INTRAVITREAL | Status: AC | PRN
  Administered 2021-07-22: 2 mg via INTRAVITREAL

## 2021-07-22 NOTE — Assessment & Plan Note (Signed)
OD, now stabilized with vascularized PED currently at 12-week interval.  No interval change in macular configuration nor acuity.  Repeat injection Eylea today

## 2021-07-22 NOTE — Assessment & Plan Note (Signed)
OS, vastly improved and stabilized currently 12-week follow-up, follow-up OS as scheduled

## 2021-07-22 NOTE — Progress Notes (Signed)
07/22/2021     CHIEF COMPLAINT Patient presents for  Chief Complaint  Patient presents with   Retina Follow Up      HISTORY OF PRESENT ILLNESS: Angie Liu is a 75 y.o. female who presents to the clinic today for:   HPI     Retina Follow Up   Patient presents with  Wet AMD.  In right eye.  This started 12 weeks ago.  Duration of 12 weeks.        Comments   12 week f/u OD with OCT and possible Eylea injection  Pt c/o decreased vision in both eyes, started about 1-2 months ago. Pt states she has been struggling with a shingles infection throughout the summer time and she notes during this infection is when the decrease in vision occurred. Pt denies any new flashes or floaters, Pt denies any eye pain.  Eye Meds: None      Last edited by Reather Littler, COA on 07/22/2021 11:02 AM.      Referring physician: Hayden Rasmussen, MD [place],  [place] [address]  HISTORICAL INFORMATION:   Selected notes from the MEDICAL RECORD NUMBER       CURRENT MEDICATIONS: No current outpatient medications on file. (Ophthalmic Drugs)   No current facility-administered medications for this visit. (Ophthalmic Drugs)   Current Outpatient Medications (Other)  Medication Sig   ALPRAZolam (XANAX) 0.5 MG tablet take one tablet by mouth at bedtime and one tablet by mouth every day as needed for anxiety   doxepin (SINEQUAN) 100 MG capsule Take 1 capsule (100 mg total) by mouth at bedtime.   FOLIC ACID PO Take by mouth. (Patient not taking: Reported on 07/10/2021)   OVER THE COUNTER MEDICATION 4 capsules daily. Calm CP, takes 1 breakfast, 1 at dinner and 2 at bedtime   OVER THE COUNTER MEDICATION Alpha gabba   pregabalin (LYRICA) 25 MG capsule Take 1 capsule (25 mg total) by mouth 2 (two) times daily. (Patient not taking: Reported on 07/10/2021)   Progesterone Micronized (PROGESTERONE PO) Take 2 capsules by mouth at bedtime. 1:1 SR compound medication at [facility].    traMADol (ULTRAM) 50 MG tablet Take 50 mg by mouth every 6 (six) hours as needed. (Patient not taking: Reported on 07/10/2021)   venlafaxine XR (EFFEXOR XR) 37.5 MG 24 hr capsule Take 1 capsule (37.5 mg total) by mouth daily with breakfast.   zolpidem (AMBIEN CR) 12.5 MG CR tablet Take 12.5 mg by mouth at bedtime as needed for sleep.   No current facility-administered medications for this visit. (Other)      REVIEW OF SYSTEMS:    ALLERGIES Allergies  Allergen Reactions   Dust Mite Extract    Latex Rash    PAST MEDICAL HISTORY Past Medical History:  Diagnosis Date   Anxiety    Depression    Hayfever    Past Surgical History:  Procedure Laterality Date   CATARACT EXTRACTION W/PHACO Bilateral 2018   TONSILLECTOMY      FAMILY HISTORY Family History  Problem Relation Age of Onset   Cancer Mother        colon   Depression Mother    Diabetes Father    Depression Father    Depression Son    Depression Paternal Uncle     SOCIAL HISTORY Social History   Tobacco Use   Smoking status: Never   Smokeless tobacco: Never  Substance Use Topics   Alcohol use:  Yes    Alcohol/week: 2.0 standard drinks    Types: 2 Glasses of wine per week    Comment: 2 glass a day   Drug use: No         OPHTHALMIC EXAM:  Base Eye Exam     Visual Acuity (ETDRS)       Right Left   Dist cc 20/40 +1 20/40 +2   Dist ph cc NI 20/30 -2    Correction: Glasses         Tonometry (Tonopen, 11:07 AM)       Right Left   Pressure 10 11         Pupils       Pupils Dark Light Shape React APD   Right PERRL 4 3 Round Brisk None   Left PERRL 4 3 Round Brisk None         Visual Fields (Counting fingers)       Left Right    Full Full         Extraocular Movement       Right Left    Full, Ortho Full, Ortho         Neuro/Psych     Oriented x3: Yes   Mood/Affect: Normal         Dilation     Right eye: 1.0% Mydriacyl, 2.5% Phenylephrine @ 11:07 AM            Slit Lamp and Fundus Exam     External Exam       Right Left   External Normal Normal         Slit Lamp Exam       Right Left   Lids/Lashes Normal Normal   Conjunctiva/Sclera White and quiet White and quiet   Cornea Clear Clear   Anterior Chamber Deep and quiet Deep and quiet   Iris Round and reactive Round and reactive   Lens Posterior chamber intraocular lens Posterior chamber intraocular lens   Anterior Vitreous Normal Normal         Fundus Exam       Right Left   Posterior Vitreous Posterior vitreous detachment    Disc Normal    C/D Ratio 0.5    Macula no hemorrhage, no exudates, Retinal pigment epithelial mottling, no macular thickening, Retinal pigment epithelial detachment    Vessels Normal    Periphery Normal             IMAGING AND PROCEDURES  Imaging and Procedures for 07/22/21  OCT, Retina - OU - Both Eyes       Right Eye Quality was good. Scan locations included subfoveal. Central Foveal Thickness: 282. Progression has been stable. Findings include pigment epithelial detachment, retinal drusen , no SRF, no IRF.   Left Eye Quality was good. Scan locations included subfoveal. Central Foveal Thickness: 250. Progression has improved. Findings include no IRF, no SRF.   Notes OS, now 12 weeks post therapy OS , and much smaller vascularized PED and a solution of subretinal fluid previously in the foveal location as compared to November 2020.  OD at 12-week follow-up today post injection Eylea, pigment epithelial detachment stable, no intra or subretinal fluid , Maintain 12-week follow-up at this time     Intravitreal Injection, Pharmacologic Agent - OD - Right Eye       Time Out 07/22/2021. 11:33 AM. Confirmed correct patient, procedure, site, and patient consented.   Anesthesia Topical anesthesia was used. Anesthetic medications included Ak[place] 3.5%.  Procedure Preparation included Ofloxacin , Tobramycin 0.3%, 10% betadine to eyelids, 5%  betadine to ocular surface. A 30 gauge needle was used.   Injection: 2 mg aflibercept 2 MG/0.05ML   Route: Intravitreal, Site: Right Eye   NDC: [id number], Lot: [id number], Waste: 0 mL   Post-op Post injection exam found visual acuity of at least counting fingers. The patient tolerated the procedure well. There were no complications. The patient received written and verbal post procedure care education. Post injection medications included ocuflox.              ASSESSMENT/PLAN:  Exudative age-related macular degeneration of right eye with active choroidal neovascularization (HCC) OD, now stabilized with vascularized PED currently at 12-week interval.  No interval change in macular configuration nor acuity.  Repeat injection Eylea today  Exudative age-related macular degeneration of left eye with active choroidal neovascularization (HCC) OS, vastly improved and stabilized currently 12-week follow-up, follow-up OS as scheduled     ICD-10-CM   1. Exudative age-related macular degeneration of right eye with active choroidal neovascularization (HCC)  H35.3211 OCT, Retina - OU - Both Eyes    Intravitreal Injection, Pharmacologic Agent - OD - Right Eye    aflibercept (EYLEA) SOLN 2 mg    2. Exudative age-related macular degeneration of left eye with active choroidal neovascularization ([place])  H35.3221       1.  OD with chronic subfoveal vascularized PED but no signs of active CNVM today at 12-week interval.  Repeat injection Eylea today at 12-week interval and examination next in 12 weeks  2.  OS follow-up as scheduled or in 12 weeks dilate OU  3.  Ophthalmic Meds Ordered this visit:  Meds ordered this encounter  Medications   aflibercept (EYLEA) SOLN 2 mg       Return in about 12 weeks (around 10/14/2021) for dilate, OD, EYLEA OCT.  There are no Patient Instructions on file for this visit.   Explained the diagnoses, plan, and follow up with the patient and they  expressed understanding.  Patient expressed understanding of the importance of proper follow up care.   Clent Demark Alazne Quant M.D. Diseases & Surgery of the Retina and Vitreous Retina & Diabetic [place] 07/22/21     Abbreviations: M myopia (nearsighted); A astigmatism; H hyperopia (farsighted); P presbyopia; Mrx spectacle prescription;  CTL contact lenses; OD right eye; OS left eye; OU both eyes  XT exotropia; ET esotropia; PEK punctate epithelial keratitis; PEE punctate epithelial erosions; DES dry eye syndrome; MGD meibomian gland dysfunction; ATs artificial tears; PFAT's preservative free artificial tears; [place] nuclear sclerotic cataract; PSC posterior subcapsular cataract; ERM epi-retinal membrane; PVD posterior vitreous detachment; RD retinal detachment; DM diabetes mellitus; DR diabetic retinopathy; NPDR non-proliferative diabetic retinopathy; PDR proliferative diabetic retinopathy; CSME clinically significant macular edema; DME diabetic macular edema; dbh dot blot hemorrhages; CWS cotton wool spot; POAG primary open angle glaucoma; C/D cup-to-disc ratio; HVF humphrey visual field; GVF goldmann visual field; OCT optical coherence tomography; IOP intraocular pressure; BRVO Branch retinal vein occlusion; CRVO central retinal vein occlusion; CRAO central retinal artery occlusion; BRAO branch retinal artery occlusion; RT retinal tear; SB scleral buckle; PPV pars plana vitrectomy; VH Vitreous hemorrhage; PRP panretinal laser photocoagulation; IVK intravitreal kenalog; VMT vitreomacular traction; MH Macular hole;  NVD neovascularization of the disc; NVE neovascularization elsewhere; AREDS age related eye disease study; ARMD age related macular degeneration; POAG primary open angle glaucoma; EBMD epithelial/anterior basement membrane dystrophy; ACIOL anterior chamber intraocular lens; IOL intraocular lens; PCIOL posterior chamber intraocular  lens; Phaco/IOL phacoemulsification with intraocular lens placement;  [place] photorefractive keratectomy; LASIK laser assisted in situ keratomileusis; HTN hypertension; DM diabetes mellitus; COPD chronic obstructive pulmonary disease

## 2021-07-23 ENCOUNTER — Encounter: Payer: Self-pay | Admitting: Family Medicine

## 2021-07-23 ENCOUNTER — Ambulatory Visit (INDEPENDENT_AMBULATORY_CARE_PROVIDER_SITE_OTHER): Payer: PPO | Admitting: Family Medicine

## 2021-07-23 VITALS — BP 124/72 | HR 111 | Temp 98.4°F | Resp 18 | Ht 66.0 in | Wt 163.0 lb

## 2021-07-23 DIAGNOSIS — Z0000 Encounter for general adult medical examination without abnormal findings: Secondary | ICD-10-CM | POA: Diagnosis not present

## 2021-07-23 DIAGNOSIS — Z23 Encounter for immunization: Secondary | ICD-10-CM | POA: Diagnosis not present

## 2021-07-23 DIAGNOSIS — Z79890 Hormone replacement therapy (postmenopausal): Secondary | ICD-10-CM

## 2021-07-23 NOTE — Patient Instructions (Signed)
Good to see you today Flu shot today Ok to get shingles and new covid booster at your convenience I will call [facility] and troubleshoot your progesterone rx, and move it to [facility]   Take care!  Assuming all is well let's visit in 6 months

## 2021-07-24 MED ORDER — PROGESTERONE MICRONIZED 100 MG PO CAPS: 100.0000 mg | ORAL_CAPSULE | Freq: Every day | ORAL | 3 refills | Status: DC

## 2021-07-28 ENCOUNTER — Ambulatory Visit (INDEPENDENT_AMBULATORY_CARE_PROVIDER_SITE_OTHER): Payer: PPO | Admitting: Ophthalmology

## 2021-07-28 ENCOUNTER — Ambulatory Visit: Payer: PPO | Admitting: Psychiatry

## 2021-07-28 ENCOUNTER — Other Ambulatory Visit: Payer: Self-pay

## 2021-07-28 ENCOUNTER — Encounter (INDEPENDENT_AMBULATORY_CARE_PROVIDER_SITE_OTHER): Payer: PPO | Admitting: Ophthalmology

## 2021-07-28 ENCOUNTER — Encounter (INDEPENDENT_AMBULATORY_CARE_PROVIDER_SITE_OTHER): Payer: Self-pay | Admitting: Ophthalmology

## 2021-07-28 DIAGNOSIS — H353211 Exudative age-related macular degeneration, right eye, with active choroidal neovascularization: Secondary | ICD-10-CM

## 2021-07-28 DIAGNOSIS — H353221 Exudative age-related macular degeneration, left eye, with active choroidal neovascularization: Secondary | ICD-10-CM | POA: Diagnosis not present

## 2021-07-28 MED ORDER — AFLIBERCEPT 2MG/0.05ML IZ SOLN FOR KALEIDOSCOPE
2.0000 mg | INTRAVITREAL | Status: AC | PRN
  Administered 2021-07-28: 2 mg via INTRAVITREAL

## 2021-07-28 NOTE — Assessment & Plan Note (Signed)
OS today now 13 weeks post last injection.  Condition stable.  We will repeat injection today and follow-up in 14 to 15 weeks

## 2021-07-28 NOTE — Assessment & Plan Note (Signed)
OD vastly improved over the last 1 year with use of Eylea.  Now 1 week post most recent injection planned for another 12 to 13-week interval examination total

## 2021-07-28 NOTE — Progress Notes (Signed)
07/28/2021     CHIEF COMPLAINT Patient presents for  Chief Complaint  Patient presents with   Retina Follow Up      HISTORY OF PRESENT ILLNESS: Angie Liu is a 75 y.o. female who presents to the clinic today for:   HPI     Retina Follow Up   Patient presents with  Wet AMD.  In right eye.  This started 13 weeks ago.  Duration of 13 weeks.        Comments   13 weeks fu OS oct eylea OS. "It seems not as good as it used to be. I am expecting to eventually get a change in my glasses prescription." Pt denies new FOL, eye pain, or floaters.       Last edited by Laurin Coder on 07/28/2021  1:30 PM.      Referring physician: Hayden Rasmussen, MD [address] [place],  [place] [id number]  HISTORICAL INFORMATION:   Selected notes from the MEDICAL RECORD NUMBER       CURRENT MEDICATIONS: No current outpatient medications on file. (Ophthalmic Drugs)   No current facility-administered medications for this visit. (Ophthalmic Drugs)   Current Outpatient Medications (Other)  Medication Sig   ALPRAZolam (XANAX) 0.5 MG tablet take one tablet by mouth at bedtime and one tablet by mouth every day as needed for anxiety   b complex vitamins capsule Take 1 capsule by mouth daily.   doxepin (SINEQUAN) 100 MG capsule Take 1 capsule (100 mg total) by mouth at bedtime.   FOLIC ACID PO Take by mouth.   Multiple Vitamin (MULTIVITAMIN) capsule Take 1 capsule by mouth daily.   OVER THE COUNTER MEDICATION 4 capsules daily. Calm CP, takes 1 breakfast, 1 at dinner and 2 at bedtime   OVER THE COUNTER MEDICATION Alpha gabba   pregabalin (LYRICA) 25 MG capsule Take 1 capsule (25 mg total) by mouth 2 (two) times daily.   progesterone (PROMETRIUM) 100 MG capsule Take 1 capsule (100 mg total) by mouth daily.   QUERCETIN PO Take by mouth.   traMADol (ULTRAM) 50 MG tablet Take 50 mg by mouth every 6 (six) hours as needed.   venlafaxine XR (EFFEXOR XR) 37.5 MG 24 hr capsule Take 1  capsule (37.5 mg total) by mouth daily with breakfast.   zolpidem (AMBIEN CR) 12.5 MG CR tablet Take 12.5 mg by mouth at bedtime as needed for sleep.   No current facility-administered medications for this visit. (Other)      REVIEW OF SYSTEMS:    ALLERGIES Allergies  Allergen Reactions   Dust Mite Extract    Latex Rash    PAST MEDICAL HISTORY Past Medical History:  Diagnosis Date   Anxiety    Depression    Hayfever    Past Surgical History:  Procedure Laterality Date   CATARACT EXTRACTION W/PHACO Bilateral 2018   TONSILLECTOMY      FAMILY HISTORY Family History  Problem Relation Age of Onset   Cancer Mother        colon   Depression Mother    Diabetes Father    Depression Father    Depression Son    Depression Paternal Uncle     SOCIAL HISTORY Social History   Tobacco Use   Smoking status: Never   Smokeless tobacco: Never  Substance Use Topics   Alcohol use: Yes    Alcohol/week: 2.0 standard drinks    Types: 2 Glasses of wine per week  Comment: 2 glass a day   Drug use: No         OPHTHALMIC EXAM:  Base Eye Exam     Visual Acuity (ETDRS)       Right Left   Dist cc 20/30 -1 20/40 +2   Dist ph cc NI          Tonometry (Tonopen, 1:36 PM)       Right Left   Pressure 13 13         Pupils       Pupils Dark Light APD   Right PERRL 4 3 None   Left PERRL 4 3 None         Extraocular Movement       Right Left    Full Full         Neuro/Psych     Oriented x3: Yes   Mood/Affect: Normal         Dilation     Both eyes: 1.0% Mydriacyl, 2.5% Phenylephrine @ 1:35 PM           Slit Lamp and Fundus Exam     External Exam       Right Left   External Normal Normal         Slit Lamp Exam       Right Left   Lids/Lashes Normal Normal   Conjunctiva/Sclera White and quiet White and quiet   Cornea Clear Clear   Anterior Chamber Deep and quiet Deep and quiet   Iris Round and reactive Round and reactive   Lens  Posterior chamber intraocular lens Posterior chamber intraocular lens   Anterior Vitreous Normal Normal         Fundus Exam       Right Left   Posterior Vitreous Posterior vitreous detachment Posterior vitreous detachment   Disc Normal Normal   C/D Ratio 0.55 0.6   Macula no hemorrhage, no exudates, Retinal pigment epithelial mottling, no macular thickening, Retinal pigment epithelial detachment, Soft drusen Atrophy, Retinal pigment epithelial detachment, Drusen, no exudates, no hemorrhage, no macular thickening, Soft drusen, Intermediate age related macular degeneration, Pigmented atrophy   Vessels Normal Normal   Periphery Normal Normal            IMAGING AND PROCEDURES  Imaging and Procedures for 07/28/21  OCT, Retina - OU - Both Eyes       Right Eye Quality was good. Scan locations included subfoveal. Central Foveal Thickness: 284. Progression has been stable. Findings include pigment epithelial detachment, retinal drusen , no SRF, no IRF.   Left Eye Quality was good. Scan locations included subfoveal. Central Foveal Thickness: 251. Progression has improved. Findings include no IRF, no SRF.   Notes OS, now 13 weeks post therapy OS , and much smaller vascularized PED and a solution of subretinal fluid previously in the foveal location as compared to November 2020.  Repeat injection Eylea today and extend interval examination next  OD at 1 week post injection Eylea OD     Intravitreal Injection, Pharmacologic Agent - OS - Left Eye       Time Out 07/28/2021. 2:19 PM. Confirmed correct patient, procedure, site, and patient consented.   Anesthesia Topical anesthesia was used. Anesthetic medications included Akten 3.5%.   Procedure Preparation included 10% betadine to eyelids, 5% betadine to ocular surface, Tobramycin 0.3%. A 30 gauge needle was used.   Injection: 2 mg aflibercept 2 MG/0.05ML   Route: Intravitreal, Site: Left Eye   NDC: [id number],  Waste: 0 mL    Post-op Post injection exam found visual acuity of at least counting fingers. The patient tolerated the procedure well. There were no complications. The patient received written and verbal post procedure care education. Post injection medications included ocuflox.              ASSESSMENT/PLAN:  Exudative age-related macular degeneration of right eye with active choroidal neovascularization ([place]) OD vastly improved over the last 1 year with use of Eylea.  Now 1 week post most recent injection planned for another 12 to 13-week interval examination total  Exudative age-related macular degeneration of left eye with active choroidal neovascularization ([place]) OS today now 13 weeks post last injection.  Condition stable.  We will repeat injection today and follow-up in 14 to 15 weeks     ICD-10-CM   1. Exudative age-related macular degeneration of left eye with active choroidal neovascularization (HCC)  H35.3221 OCT, Retina - OU - Both Eyes    Intravitreal Injection, Pharmacologic Agent - OS - Left Eye    aflibercept (EYLEA) SOLN 2 mg    2. Exudative age-related macular degeneration of right eye with active choroidal neovascularization ([place])  H35.3211       1.  OS vastly improved over the last 2 years.  Will repeat injection Eylea today at thirteen week interval.  Follow-up OS in 15 weeks  2.  OD 1 week post most recent injection, doing very well.  Maintain current scheduled evaluation again in 11 to 12 weeks from last visit  3.  Ophthalmic Meds ordered this visit:  Meds ordered this encounter  Medications   aflibercept (EYLEA) SOLN 2 mg       Return in about 15 weeks (around 11/10/2021) for dilate, OS, EYLEA OCT,, and follow-up OD as scheduled.  There are no Patient Instructions on file for this visit.   Explained the diagnoses, plan, and follow up with the patient and they expressed understanding.  Patient expressed understanding of the importance of proper follow up care.    Clent Demark Lorenza Shakir M.D. Diseases & Surgery of the Retina and Vitreous [facility] [place] 07/28/21     Abbreviations: M myopia (nearsighted); A astigmatism; H hyperopia (farsighted); P presbyopia; Mrx spectacle prescription;  CTL contact lenses; OD right eye; OS left eye; OU both eyes  XT exotropia; ET esotropia; PEK punctate epithelial keratitis; PEE punctate epithelial erosions; DES dry eye syndrome; MGD meibomian gland dysfunction; ATs artificial tears; PFAT's preservative free artificial tears; [place] nuclear sclerotic cataract; PSC posterior subcapsular cataract; ERM epi-retinal membrane; PVD posterior vitreous detachment; RD retinal detachment; DM diabetes mellitus; DR diabetic retinopathy; NPDR non-proliferative diabetic retinopathy; PDR proliferative diabetic retinopathy; CSME clinically significant macular edema; DME diabetic macular edema; dbh dot blot hemorrhages; CWS cotton wool spot; POAG primary open angle glaucoma; C/D cup-to-disc ratio; HVF humphrey visual field; GVF goldmann visual field; OCT optical coherence tomography; IOP intraocular pressure; BRVO Branch retinal vein occlusion; CRVO central retinal vein occlusion; CRAO central retinal artery occlusion; BRAO branch retinal artery occlusion; RT retinal tear; SB scleral buckle; PPV pars plana vitrectomy; VH Vitreous hemorrhage; PRP panretinal laser photocoagulation; IVK intravitreal kenalog; VMT vitreomacular traction; MH Macular hole;  NVD neovascularization of the disc; NVE neovascularization elsewhere; AREDS age related eye disease study; ARMD age related macular degeneration; POAG primary open angle glaucoma; EBMD epithelial/anterior basement membrane dystrophy; ACIOL anterior chamber intraocular lens; IOL intraocular lens; PCIOL posterior chamber intraocular lens; Phaco/IOL phacoemulsification with intraocular lens placement; PRK photorefractive keratectomy; LASIK laser assisted  in situ keratomileusis; HTN hypertension; DM  diabetes mellitus; COPD chronic obstructive pulmonary disease

## 2021-07-29 ENCOUNTER — Encounter (INDEPENDENT_AMBULATORY_CARE_PROVIDER_SITE_OTHER): Payer: PPO | Admitting: Ophthalmology

## 2021-07-31 ENCOUNTER — Telehealth: Payer: Self-pay | Admitting: Psychiatry

## 2021-07-31 DIAGNOSIS — F411 Generalized anxiety disorder: Secondary | ICD-10-CM

## 2021-07-31 DIAGNOSIS — F331 Major depressive disorder, recurrent, moderate: Secondary | ICD-10-CM

## 2021-07-31 MED ORDER — VENLAFAXINE HCL ER 37.5 MG PO CP24: 75.0000 mg | ORAL_CAPSULE | Freq: Every day | ORAL | 1 refills | Status: DC

## 2021-07-31 NOTE — Telephone Encounter (Signed)
Pt stated she was fine last week with taking 1 tab in the am but this week she seems to be more depressed and thinks she needs to increase the dose.

## 2021-07-31 NOTE — Telephone Encounter (Signed)
Ok to increase to two capsules in the morning. Please let her know to contact office if she will run out of medication before her next apt and script can be sent in for 75 mg capsule if she tolerates taking two of the 37.5 mg capsules.

## 2021-07-31 NOTE — Telephone Encounter (Signed)
Pt informed

## 2021-07-31 NOTE — Telephone Encounter (Signed)
Next appt is 08/06/21. Angie Liu has been taking 1 Venlafaxine in the AM and would like to know if she can take 2 in the AM? Her phone number is [phone].

## 2021-08-05 ENCOUNTER — Encounter (INDEPENDENT_AMBULATORY_CARE_PROVIDER_SITE_OTHER): Payer: PPO | Admitting: Ophthalmology

## 2021-08-06 ENCOUNTER — Ambulatory Visit: Payer: PPO | Admitting: Psychiatry

## 2021-08-06 ENCOUNTER — Encounter: Payer: Self-pay | Admitting: Psychiatry

## 2021-08-06 ENCOUNTER — Other Ambulatory Visit: Payer: Self-pay

## 2021-08-06 DIAGNOSIS — F411 Generalized anxiety disorder: Secondary | ICD-10-CM | POA: Diagnosis not present

## 2021-08-06 DIAGNOSIS — F5101 Primary insomnia: Secondary | ICD-10-CM

## 2021-08-06 DIAGNOSIS — F331 Major depressive disorder, recurrent, moderate: Secondary | ICD-10-CM | POA: Diagnosis not present

## 2021-08-06 MED ORDER — ALPRAZOLAM 0.5 MG PO TABS: ORAL_TABLET | ORAL | 1 refills | Status: DC

## 2021-08-06 MED ORDER — VENLAFAXINE HCL ER 37.5 MG PO CP24: 37.5000 mg | ORAL_CAPSULE | Freq: Every day | ORAL | 0 refills | Status: DC
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
## 2021-09-02 ENCOUNTER — Ambulatory Visit: Payer: PPO | Admitting: Neurology

## 2021-10-01 ENCOUNTER — Telehealth: Payer: Self-pay | Admitting: Psychiatry

## 2021-10-01 NOTE — Telephone Encounter (Signed)
Filled 10/6 for 90 days pt will not be out before appt

## 2021-10-01 NOTE — Telephone Encounter (Signed)
Pt LVM for Effexor Refill  Next appt 12/6

## 2021-10-02 ENCOUNTER — Other Ambulatory Visit: Payer: Self-pay

## 2021-10-02 DIAGNOSIS — F331 Major depressive disorder, recurrent, moderate: Secondary | ICD-10-CM

## 2021-10-02 DIAGNOSIS — F411 Generalized anxiety disorder: Secondary | ICD-10-CM

## 2021-10-02 MED ORDER — VENLAFAXINE HCL ER 37.5 MG PO CP24: 37.5000 mg | ORAL_CAPSULE | Freq: Two times a day (BID) | ORAL | 0 refills | Status: DC

## 2021-10-02 NOTE — Telephone Encounter (Signed)
Is this ok for her to take 2?If so I will send a new rx

## 2021-10-02 NOTE — Telephone Encounter (Signed)
Next visit is 10/06/21. Addendum to attached message. Angie Liu called asking about a refill for her Effexor and said she is out of it now. I told her it was filled on 10/6 for 90 days and she should not be out before appt. She then said she is out because Janett Billow told her that she can start at one and increase to 2. I don't see that in the note, only she needs to take 1 by mouth at breakfast. Could someone please call her at [phone] regarding the message.

## 2021-10-02 NOTE — Telephone Encounter (Signed)
Rx sent 

## 2021-10-06 ENCOUNTER — Ambulatory Visit: Payer: PPO | Admitting: Psychiatry

## 2021-10-06 ENCOUNTER — Other Ambulatory Visit: Payer: Self-pay

## 2021-10-06 ENCOUNTER — Encounter: Payer: Self-pay | Admitting: Psychiatry

## 2021-10-06 DIAGNOSIS — F331 Major depressive disorder, recurrent, moderate: Secondary | ICD-10-CM

## 2021-10-06 DIAGNOSIS — F5101 Primary insomnia: Secondary | ICD-10-CM

## 2021-10-06 MED ORDER — DOXEPIN HCL 100 MG PO CAPS: 100.0000 mg | ORAL_CAPSULE | Freq: Every day | ORAL | 1 refills | Status: DC

## 2021-10-06 MED ORDER — ZOLPIDEM TARTRATE ER 12.5 MG PO TBCR: 12.5000 mg | EXTENDED_RELEASE_TABLET | Freq: Every evening | ORAL | 1 refills | Status: DC | PRN

## 2021-10-06 NOTE — Progress Notes (Signed)
[name] [id number] [birth year] 75 y.o.  Subjective:   Patient ID:  Angie Liu is a 75 y.o. (DOB Jan 29, 1946) female.  Chief Complaint:  Chief Complaint  Patient presents with   Follow-up    Depression, anxiety, and insomnia    HPI Angie Liu presents to the office today for follow-up of depression, anxiety, and insomnia. "Most of the time I am ok." She reports that she had misplaced her Effexor and was without it for about 3 days and had crying episodes. She reports that she then found it with some of her supplements and "when I found the Effexor I was doing so much better." She reports that her mood and anxiety are improved with Effexor. She denies depression "as long as I am taking the Effexor." She reports some anxiety with driving in congested areas. She avoids interstate driving. Sleeping well with medication. Occ sleeps throughout the night. Energy and motivation are "ok." Exercising periodically. Appetite has been ok. Concentration has been good. She reports that she is playing music occasionally. Enjoys time with her husband. Denies SI.   Her best friend lives in [place] and they communicate regularly and are reading a book together on self-compassion.   Will be traveling to daughter's for Christmas. She reports that she will likely take Xanax prn at this time. Had Thanksgiving meal with neighbors.   Past Psychotropic Medication Trials: Lyrica Ambien Alprazolam  Doxepin- Has been ineffective at 25 mg. Noticed some slight improvement in sleep initiation and maintenance with increase to 50 mg po QHS. Started 25 mg on 09/26/17.  Hydroxyzine- Ineffective Xanax Ambien CR- Has been most helpful Ambien Sonata Rozerem- Ineffective Trileptal- Took for several years Remeron Trazodone Bupropion Lexapro- Helpful many years ago but did not respond more recently. Wellbutrin- Tremor Luvox- Took 50 mg po qd Prozac- took briefly and did not respond well Zoloft- "remember  I didn't like it." Cymbalta- May have caused tremors Has not taken Celexa, Buspar, Effexor Abilify, Lamictal, Seroquel, Olanzapine  PHQ2-9    [place] Office Visit from 03/06/2015 in Primary [facility] at [facility] Total Score 0        Review of Systems:  Review of Systems  Musculoskeletal:  Negative for gait problem.  Neurological:  Negative for tremors.  Psychiatric/Behavioral:         Please refer to HPI   Medications: I have reviewed the patient's current medications.  Current Outpatient Medications  Medication Sig Dispense Refill   ALPRAZolam (XANAX) 0.5 MG tablet take one tablet by mouth at bedtime and one tablet by mouth every day as needed for anxiety 180 tablet 1   b complex vitamins capsule Take 1 capsule by mouth daily.     Multiple Vitamin (MULTIVITAMIN) capsule Take 1 capsule by mouth daily.     Multiple Vitamins-Minerals (PRESERVISION AREDS 2 PO) Take by mouth.     OVER THE COUNTER MEDICATION 4 capsules daily. Calm CP, takes 1 breakfast, 1 at dinner and 2 at bedtime     OVER THE COUNTER MEDICATION Alpha gabba     Prenatal Vit-Fe Fumarate-FA (PRENATAL VITAMINS PO) Take by mouth.     progesterone (PROMETRIUM) 100 MG capsule Take 1 capsule (100 mg total) by mouth daily. 90 capsule 3   traMADol (ULTRAM) 50 MG tablet Take 50 mg by mouth every 6 (six) hours as needed.     venlafaxine XR (EFFEXOR XR) 37.5 MG 24 hr capsule Take 1 capsule (37.5 mg total) by mouth 2 (two) times  daily. (Patient taking differently: Take 37.5 mg by mouth daily with breakfast.) 180 capsule 0   doxepin (SINEQUAN) 100 MG capsule Take 1 capsule (100 mg total) by mouth at bedtime. 90 capsule 1   pregabalin (LYRICA) 25 MG capsule Take 1 capsule (25 mg total) by mouth 2 (two) times daily. (Patient not taking: Reported on 10/06/2021) 90 capsule 2   QUERCETIN PO Take by mouth.     [START ON 11/26/2021] zolpidem (AMBIEN CR) 12.5 MG CR tablet Take 1 tablet (12.5 mg total) by mouth at bedtime as needed for  sleep. 90 tablet 1   No current facility-administered medications for this visit.    Medication Side Effects: None  Allergies:  Allergies  Allergen Reactions   Dust Mite Extract    Latex Rash    Past Medical History:  Diagnosis Date   Anxiety    Depression    Hayfever     Past Medical History, Surgical history, Social history, and Family history were reviewed and updated as appropriate.   Please see review of systems for further details on the patient's review from today.   Objective:   Physical Exam:  There were no vitals taken for this visit.  Physical Exam Constitutional:      General: She is not in acute distress. Musculoskeletal:        General: No deformity.  Neurological:     Mental Status: She is alert and oriented to person, place, and time.     Coordination: Coordination normal.  Psychiatric:        Attention and Perception: Attention and perception normal. She does not perceive auditory or visual hallucinations.        Mood and Affect: Mood normal. Mood is not anxious or depressed. Affect is not labile, blunt, angry or inappropriate.        Speech: Speech normal.        Behavior: Behavior normal.        Thought Content: Thought content normal. Thought content is not paranoid or delusional. Thought content does not include homicidal or suicidal ideation. Thought content does not include homicidal or suicidal plan.        Cognition and Memory: Cognition and memory normal.        Judgment: Judgment normal.     Comments: Insight intact    Lab Review:     Component Value Date/Time   NA 143 05/25/2021 1517   K 5.0 05/25/2021 1517   CL 105 05/25/2021 1517   CO2 24 05/25/2021 1517   GLUCOSE 98 05/25/2021 1517   BUN 16 05/25/2021 1517   CREATININE 0.77 05/25/2021 1517   CALCIUM 9.3 05/25/2021 1517   PROT 6.7 05/25/2021 1517   ALBUMIN 4.8 (H) 05/25/2021 1517   AST 22 05/25/2021 1517   ALT 18 05/25/2021 1517   ALKPHOS 118 05/25/2021 1517   BILITOT 0.2  05/25/2021 1517       Component Value Date/Time   WBC 5.5 05/25/2021 1517   RBC 3.99 05/25/2021 1517   HGB 13.6 05/25/2021 1517   HCT 39.5 05/25/2021 1517   PLT 244 05/25/2021 1517   MCV 99 (H) 05/25/2021 1517   MCH 34.1 (H) 05/25/2021 1517   MCHC 34.4 05/25/2021 1517   RDW 14.6 05/25/2021 1517   LYMPHSABS 0.9 05/25/2021 1517   EOSABS 0.1 05/25/2021 1517   BASOSABS 0.0 05/25/2021 1517    No results found for: POCLITH, LITHIUM   No results found for: PHENYTOIN, PHENOBARB, VALPROATE, CBMZ   .res  Assessment: Plan:   Will continue current plan of care since target signs and symptoms are well controlled without any tolerability issues. Continue Effexor XR 37.5 mg po qd for depression and anxiety.  Continue Ambien CR 12.5 mg po QHS for insomnia.  Continue Doxepin 100 mg po QHS for insomnia and depression.  Continue Xanax 0.5 mg po QHS and prn anxiety.  Pt to follow-up in 6 months or sooner if clinically indicated.  Patient advised to contact office with any questions, adverse effects, or acute worsening in signs and symptoms.   Tenzin was seen today for follow-up.  Diagnoses and all orders for this visit:  Primary insomnia -     doxepin (SINEQUAN) 100 MG capsule; Take 1 capsule (100 mg total) by mouth at bedtime. -     zolpidem (AMBIEN CR) 12.5 MG CR tablet; Take 1 tablet (12.5 mg total) by mouth at bedtime as needed for sleep.  Moderate recurrent major depression (HCC) -     doxepin (SINEQUAN) 100 MG capsule; Take 1 capsule (100 mg total) by mouth at bedtime.    Please see After Visit Summary for patient specific instructions.  Future Appointments  Date Time Provider [place]  10/14/2021 10:15 AM Rankin, Clent Demark, MD [facility] None  11/10/2021  1:30 PM Rankin, Clent Demark, MD RDE-RDE None  01/21/2022  1:00 PM Copland, Gay Filler, MD [facility]  04/06/2022 11:00 AM Thayer Headings, PMHNP CP-CP None    No orders of the defined types were placed in this  encounter.   -------------------------------

## 2021-10-14 ENCOUNTER — Ambulatory Visit (INDEPENDENT_AMBULATORY_CARE_PROVIDER_SITE_OTHER): Payer: PPO | Admitting: Ophthalmology

## 2021-10-14 ENCOUNTER — Other Ambulatory Visit: Payer: Self-pay

## 2021-10-14 ENCOUNTER — Encounter (INDEPENDENT_AMBULATORY_CARE_PROVIDER_SITE_OTHER): Payer: Self-pay | Admitting: Ophthalmology

## 2021-10-14 DIAGNOSIS — H353221 Exudative age-related macular degeneration, left eye, with active choroidal neovascularization: Secondary | ICD-10-CM

## 2021-10-14 DIAGNOSIS — H353211 Exudative age-related macular degeneration, right eye, with active choroidal neovascularization: Secondary | ICD-10-CM

## 2021-10-14 MED ORDER — AFLIBERCEPT 2MG/0.05ML IZ SOLN FOR KALEIDOSCOPE
2.0000 mg | INTRAVITREAL | Status: AC | PRN
  Administered 2021-10-14: 11:00:00 2 mg via INTRAVITREAL

## 2021-10-14 NOTE — Assessment & Plan Note (Signed)
OS today at 11 weeks post injection most recent follow-up as scheduled

## 2021-10-14 NOTE — Assessment & Plan Note (Signed)
OD, at 40-month follow-up today postinjection antivegF

## 2021-10-14 NOTE — Progress Notes (Signed)
10/14/2021     CHIEF COMPLAINT Patient presents for  Chief Complaint  Patient presents with   Macular Degeneration      HISTORY OF PRESENT ILLNESS: Angie Liu is a 75 y.o. female who presents to the clinic today for:   HPI   Today and follow-up at 74-month interval post most recent injection antivegF for wet AMD.  The left eye at 11 weeks post most recent injection Last edited by Hurman Horn, MD on 10/14/2021 10:42 AM.      Referring physician: No referring provider defined for this encounter.  HISTORICAL INFORMATION:   Selected notes from the MEDICAL RECORD NUMBER       CURRENT MEDICATIONS: No current outpatient medications on file. (Ophthalmic Drugs)   No current facility-administered medications for this visit. (Ophthalmic Drugs)   Current Outpatient Medications (Other)  Medication Sig   ALPRAZolam (XANAX) 0.5 MG tablet take one tablet by mouth at bedtime and one tablet by mouth every day as needed for anxiety   b complex vitamins capsule Take 1 capsule by mouth daily.   doxepin (SINEQUAN) 100 MG capsule Take 1 capsule (100 mg total) by mouth at bedtime.   Multiple Vitamin (MULTIVITAMIN) capsule Take 1 capsule by mouth daily.   Multiple Vitamins-Minerals (PRESERVISION AREDS 2 PO) Take by mouth.   OVER THE COUNTER MEDICATION 4 capsules daily. Calm CP, takes 1 breakfast, 1 at dinner and 2 at bedtime   OVER THE COUNTER MEDICATION Alpha gabba   pregabalin (LYRICA) 25 MG capsule Take 1 capsule (25 mg total) by mouth 2 (two) times daily. (Patient not taking: Reported on 10/06/2021)   Prenatal Vit-Fe Fumarate-FA (PRENATAL VITAMINS PO) Take by mouth.   progesterone (PROMETRIUM) 100 MG capsule Take 1 capsule (100 mg total) by mouth daily.   QUERCETIN PO Take by mouth.   traMADol (ULTRAM) 50 MG tablet Take 50 mg by mouth every 6 (six) hours as needed.   venlafaxine XR (EFFEXOR XR) 37.5 MG 24 hr capsule Take 1 capsule (37.5 mg total) by mouth 2 (two) times daily.  (Patient taking differently: Take 37.5 mg by mouth daily with breakfast.)   [START ON 11/26/2021] zolpidem (AMBIEN CR) 12.5 MG CR tablet Take 1 tablet (12.5 mg total) by mouth at bedtime as needed for sleep.   No current facility-administered medications for this visit. (Other)      REVIEW OF SYSTEMS: ROS   Negative for: Constitutional, Gastrointestinal, Neurological, Skin, Genitourinary, Musculoskeletal, HENT, Endocrine, Cardiovascular, Eyes, Respiratory, Psychiatric, Allergic/Imm, Heme/Lymph Last edited by Hurman Horn, MD on 10/14/2021 10:42 AM.       ALLERGIES Allergies  Allergen Reactions   Dust Mite Extract    Latex Rash    PAST MEDICAL HISTORY Past Medical History:  Diagnosis Date   Anxiety    Depression    Hayfever    Past Surgical History:  Procedure Laterality Date   CATARACT EXTRACTION W/PHACO Bilateral 2018   TONSILLECTOMY      FAMILY HISTORY Family History  Problem Relation Age of Onset   Cancer Mother        colon   Depression Mother    Diabetes Father    Depression Father    Depression Son    Depression Paternal Uncle     SOCIAL HISTORY Social History   Tobacco Use   Smoking status: Never   Smokeless tobacco: Never  Substance Use Topics   Alcohol use: Yes    Alcohol/week: 2.0 standard drinks    Types:  2 Glasses of wine per week    Comment: 2 glass a day   Drug use: No         OPHTHALMIC EXAM:  Base Eye Exam     Visual Acuity (ETDRS)       Right Left   Dist cc 20/40 20/30 +1         Tonometry (Tonopen, 10:42 AM)       Right Left   Pressure 13 10         Pupils       Pupils APD   Right PERRL None   Left PERRL None         Visual Fields       Left Right    Full Full         Extraocular Movement       Right Left    Full, Ortho Full, Ortho         Neuro/Psych     Oriented x3: Yes   Mood/Affect: Normal         Dilation     Right eye: 1.0% Mydriacyl, 2.5% Phenylephrine @ 10:43 AM            Slit Lamp and Fundus Exam     External Exam       Right Left   External Normal Normal         Slit Lamp Exam       Right Left   Lids/Lashes Normal Normal   Conjunctiva/Sclera White and quiet White and quiet   Cornea Clear Clear   Anterior Chamber Deep and quiet Deep and quiet   Iris Round and reactive Round and reactive   Lens Posterior chamber intraocular lens Posterior chamber intraocular lens   Anterior Vitreous Normal Normal         Fundus Exam       Right Left   Posterior Vitreous Posterior vitreous detachment    Disc Normal    C/D Ratio 0.55    Macula no hemorrhage, no exudates, Retinal pigment epithelial mottling, no macular thickening, Retinal pigment epithelial detachment, Soft drusen    Vessels Normal    Periphery Normal             IMAGING AND PROCEDURES  Imaging and Procedures for 10/14/21  OCT, Retina - OU - Both Eyes       Right Eye Quality was good. Scan locations included subfoveal. Central Foveal Thickness: 275. Progression has been stable. Findings include pigment epithelial detachment, retinal drusen , no SRF, no IRF.   Left Eye Quality was good. Scan locations included subfoveal. Central Foveal Thickness: 256. Progression has improved. Findings include no IRF, no SRF.   Notes OS, now 11 weeks post therapy OS , and much smaller vascularized PED and a solution of subretinal fluid previously in the foveal location as compared to November 2020.   OD at 12 week post injection Eylea OD, repeat to prevent vascularized PED progression and keep condition stabilized     Intravitreal Injection, Pharmacologic Agent - OD - Right Eye       Time Out 10/14/2021. 11:21 AM. Confirmed correct patient, procedure, site, and patient consented.   Anesthesia Topical anesthesia was used. Anesthetic medications included Lidocaine 4%.   Procedure Preparation included Ofloxacin , Tobramycin 0.3%, 10% betadine to eyelids, 5% betadine to ocular  surface. A 30 gauge needle was used.   Injection: 2 mg aflibercept 2 MG/0.05ML   Route: Intravitreal, Site: Right Eye   NDC:  27035-009-38, Lot: [id number], Waste: 0 mL   Post-op Post injection exam found visual acuity of at least counting fingers. The patient tolerated the procedure well. There were no complications. The patient received written and verbal post procedure care education. Post injection medications included ocuflox.              ASSESSMENT/PLAN:  Exudative age-related macular degeneration of left eye with active choroidal neovascularization (HCC) OS today at 11 weeks post injection most recent follow-up as scheduled  Exudative age-related macular degeneration of right eye with active choroidal neovascularization ([place]) OD, at 29-month follow-up today postinjection antivegF     ICD-10-CM   1. Exudative age-related macular degeneration of right eye with active choroidal neovascularization (HCC)  H35.3211 OCT, Retina - OU - Both Eyes    Intravitreal Injection, Pharmacologic Agent - OD - Right Eye    aflibercept (EYLEA) SOLN 2 mg    2. Exudative age-related macular degeneration of left eye with active choroidal neovascularization ([place])  H35.3221       1.  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  Stabilized on 66-month follow-up and injection Eylea.  We will maintain maintenance dosing today with a repeat injection Eylea OD  2.  OS, follow-up as scheduled in approximately 1 month  3.  Ophthalmic Meds Ordered this visit:  Meds ordered this encounter  Medications   aflibercept (EYLEA) SOLN 2 mg       Return in about 3 months (around 01/12/2022) for dilate, OD, EYLEA OCT,, and follow-up OS as scheduled.  There are no Patient Instructions on file for this visit.   Explained the diagnoses, plan, and follow up with the patient and they expressed understanding.  Patient expressed understanding of the importance of proper follow up care.    Clent Demark Anglea Gordner M.D. Diseases & Surgery of the Retina and Vitreous [facility] [place] 10/14/21     Abbreviations: M myopia (nearsighted); A astigmatism; H hyperopia (farsighted); P presbyopia; Mrx spectacle prescription;  CTL contact lenses; OD right eye; OS left eye; OU both eyes  XT exotropia; ET esotropia; PEK punctate epithelial keratitis; PEE punctate epithelial erosions; DES dry eye syndrome; MGD meibomian gland dysfunction; ATs artificial tears; PFAT's preservative free artificial tears; [place] nuclear sclerotic cataract; PSC posterior subcapsular cataract; ERM epi-retinal membrane; PVD posterior vitreous detachment; RD retinal detachment; DM diabetes mellitus; DR diabetic retinopathy; NPDR non-proliferative diabetic retinopathy; PDR proliferative diabetic retinopathy; CSME clinically significant macular edema; DME diabetic macular edema; dbh dot blot hemorrhages; CWS cotton wool spot; POAG primary open angle glaucoma; C/D cup-to-disc ratio; HVF humphrey visual field; GVF goldmann visual field; OCT optical coherence tomography; IOP intraocular pressure; BRVO Branch retinal vein occlusion; CRVO central retinal vein occlusion; CRAO central retinal artery occlusion; BRAO branch retinal artery occlusion; RT retinal tear; SB scleral buckle; PPV pars plana vitrectomy; VH Vitreous hemorrhage; PRP panretinal laser photocoagulation; IVK intravitreal kenalog; VMT vitreomacular traction; MH Macular hole;  NVD neovascularization of the disc; NVE neovascularization elsewhere; AREDS age related eye disease study; ARMD age related macular degeneration; POAG primary open angle glaucoma; EBMD epithelial/anterior basement membrane dystrophy; ACIOL anterior chamber intraocular lens; IOL intraocular lens; PCIOL posterior chamber intraocular lens; Phaco/IOL phacoemulsification with intraocular lens placement; [place] photorefractive keratectomy; LASIK laser assisted in situ keratomileusis; HTN hypertension; DM  diabetes mellitus; COPD chronic obstructive pulmonary disease

## 2021-10-19 DIAGNOSIS — D3132 Benign neoplasm of left choroid: Secondary | ICD-10-CM | POA: Diagnosis not present

## 2021-10-19 DIAGNOSIS — H52203 Unspecified astigmatism, bilateral: Secondary | ICD-10-CM | POA: Diagnosis not present

## 2021-10-19 DIAGNOSIS — H5010 Unspecified exotropia: Secondary | ICD-10-CM | POA: Diagnosis not present

## 2021-10-19 DIAGNOSIS — Z961 Presence of intraocular lens: Secondary | ICD-10-CM | POA: Diagnosis not present

## 2021-11-06 ENCOUNTER — Encounter: Payer: Self-pay | Admitting: Psychiatry

## 2021-11-06 ENCOUNTER — Ambulatory Visit: Payer: PPO | Admitting: Psychiatry

## 2021-11-06 ENCOUNTER — Other Ambulatory Visit: Payer: Self-pay

## 2021-11-06 DIAGNOSIS — F1090 Alcohol use, unspecified, uncomplicated: Secondary | ICD-10-CM | POA: Diagnosis not present

## 2021-11-06 DIAGNOSIS — F411 Generalized anxiety disorder: Secondary | ICD-10-CM

## 2021-11-06 NOTE — Progress Notes (Signed)
Angie Liu [id number] 23-Sep-1946 76 y.o.  Subjective:   Patient ID:  Angie Liu is a 76 y.o. (DOB Nov 03, 1945) female.  Chief Complaint:  Chief Complaint  Patient presents with   Other    Recent increase in ETOH use    HPI Angie Liu presents to the office emergently today regarding her husband's concern about her ETOH use. She is accompanied by her husband. She reports that she was drinking 1-2 glasses of wine nightly and her husband expressed concern about ETOH use. She then started going to a restaurant for lunch and having wine without her husband's awareness. He recently became aware of this when he saw their bank/credit card statement with over $400 in food and alcohol expenses at a restaurant over the course of a month. He reports that she also purchased egg nog and a bottle of bourbon/whisky. They both agreed that it is unusual for them to purchase liquor to have at home. Husband reports that she may be drinking out of boredom. Husband thinks "there may be some anxiety" and alcohol "smoothes it over... I don't think it's to the point of changing medication." She reports that she typically notices anxiety only when riding in a car as a passenger. She reports that she has been drinking 2 glasses of wine at lunch and 1 at supper. She reports that this pattern started after she had shingles. She reports that she thinks she has been drinking "because it's fun." She reports that she enjoys wine and "it doesn't affect me." Denies any withdrawal s/s if she does not have alcohol or drinks less.   Denies SI.   Husband reports that she has been more forgetful. He reports that she has been misplacing things.   Past Psychotropic Medication Trials: Lyrica Ambien Alprazolam  Doxepin- Has been ineffective at 25 mg. Noticed some slight improvement in sleep initiation and maintenance with increase to 50 mg po QHS. Started 25 mg on 09/26/17.  Hydroxyzine- Ineffective Xanax Ambien CR- Has been  most helpful Ambien Sonata Rozerem- Ineffective Trileptal- Took for several years Remeron Trazodone Bupropion Lexapro- Helpful many years ago but did not respond more recently. Wellbutrin- Tremor Luvox- Took 50 mg po qd Prozac- took briefly and did not respond well Zoloft- remember I didn't like it. Cymbalta- May have caused tremors Has not taken Celexa, Buspar, Effexor Abilify, Lamictal, Seroquel, Olanzapine    PHQ2-9    Flowsheet Row Office Visit from 03/06/2015 in Primary Care at [facility] Total Score 0        Review of Systems:  Review of Systems  Musculoskeletal:  Negative for gait problem.  Neurological:  Negative for tremors.  Psychiatric/Behavioral:         Please refer to HPI   Medications: I have reviewed the patient's current medications.  Current Outpatient Medications  Medication Sig Dispense Refill   ALPRAZolam (XANAX) 0.5 MG tablet take one tablet by mouth at bedtime and one tablet by mouth every day as needed for anxiety 180 tablet 1   b complex vitamins capsule Take 1 capsule by mouth daily.     doxepin (SINEQUAN) 100 MG capsule Take 1 capsule (100 mg total) by mouth at bedtime. 90 capsule 1   Multiple Vitamin (MULTIVITAMIN) capsule Take 1 capsule by mouth daily.     Multiple Vitamins-Minerals (PRESERVISION AREDS 2 PO) Take by mouth.     OVER THE COUNTER MEDICATION 4 capsules daily. Calm CP, takes 1 breakfast, 1 at dinner and 2 at  bedtime     OVER THE COUNTER MEDICATION Alpha gabba     pregabalin (LYRICA) 25 MG capsule Take 1 capsule (25 mg total) by mouth 2 (two) times daily. (Patient not taking: Reported on 10/06/2021) 90 capsule 2   Prenatal Vit-Fe Fumarate-FA (PRENATAL VITAMINS PO) Take by mouth.     progesterone (PROMETRIUM) 100 MG capsule Take 1 capsule (100 mg total) by mouth daily. 90 capsule 3   QUERCETIN PO Take by mouth.     traMADol (ULTRAM) 50 MG tablet Take 50 mg by mouth every 6 (six) hours as needed.     venlafaxine XR (EFFEXOR XR)  37.5 MG 24 hr capsule Take 1 capsule (37.5 mg total) by mouth 2 (two) times daily. (Patient taking differently: Take 37.5 mg by mouth daily with breakfast.) 180 capsule 0   [START ON 11/26/2021] zolpidem (AMBIEN CR) 12.5 MG CR tablet Take 1 tablet (12.5 mg total) by mouth at bedtime as needed for sleep. 90 tablet 1   No current facility-administered medications for this visit.    Medication Side Effects: None  Allergies:  Allergies  Allergen Reactions   Dust Mite Extract    Latex Rash    Past Medical History:  Diagnosis Date   Anxiety    Depression    Hayfever     Past Medical History, Surgical history, Social history, and Family history were reviewed and updated as appropriate.   Please see review of systems for further details on the patient's review from today.   Objective:   Physical Exam:  There were no vitals taken for this visit.  Physical Exam Constitutional:      General: She is not in acute distress. Musculoskeletal:        General: No deformity.  Neurological:     Mental Status: She is alert and oriented to person, place, and time.     Coordination: Coordination normal.  Psychiatric:        Attention and Perception: Attention and perception normal. She does not perceive auditory or visual hallucinations.        Mood and Affect: Mood normal. Mood is not anxious or depressed. Affect is not labile, blunt, angry or inappropriate.        Speech: Speech normal.        Behavior: Behavior normal.        Thought Content: Thought content normal. Thought content is not paranoid or delusional. Thought content does not include homicidal or suicidal ideation. Thought content does not include homicidal or suicidal plan.        Cognition and Memory: Cognition and memory normal.        Judgment: Judgment normal.     Comments: Insight intact    Lab Review:     Component Value Date/Time   NA 143 05/25/2021 1517   K 5.0 05/25/2021 1517   CL 105 05/25/2021 1517   CO2 24  05/25/2021 1517   GLUCOSE 98 05/25/2021 1517   BUN 16 05/25/2021 1517   CREATININE 0.77 05/25/2021 1517   CALCIUM 9.3 05/25/2021 1517   PROT 6.7 05/25/2021 1517   ALBUMIN 4.8 (H) 05/25/2021 1517   AST 22 05/25/2021 1517   ALT 18 05/25/2021 1517   ALKPHOS 118 05/25/2021 1517   BILITOT 0.2 05/25/2021 1517       Component Value Date/Time   WBC 5.5 05/25/2021 1517   RBC 3.99 05/25/2021 1517   HGB 13.6 05/25/2021 1517   HCT 39.5 05/25/2021 1517   PLT 244 05/25/2021  1517   MCV 99 (H) 05/25/2021 1517   MCH 34.1 (H) 05/25/2021 1517   MCHC 34.4 05/25/2021 1517   RDW 14.6 05/25/2021 1517   LYMPHSABS 0.9 05/25/2021 1517   EOSABS 0.1 05/25/2021 1517   BASOSABS 0.0 05/25/2021 1517    No results found for: POCLITH, LITHIUM   No results found for: PHENYTOIN, PHENOBARB, VALPROATE, CBMZ   .res Assessment: Plan:    Patient seen for 45 minutes and time spent discussing alcohol use with patient and her husband.  Discussed that chronic, daily alcohol use is not recommended since it can negatively affect mood, anxiety, insomnia, and cognition. Discussed that that chronic use can interact with medications and interfere with medications being able to regulate mood and anxiety. Cautioned patient regarding CNS depression with combining alcohol with Xanax and/or Ambien.  Advised patient not to consume alcohol close to bedtime when she takes prescribed medications for sleep.  Discussed that feeling as if alcohol does not affect her may be an indication that she is building up a tolerance to alcohol.  Discussed that continued daily alcohol use increases risk for tolerance and dependence.  Discussed that risk of complicated alcohol withdrawal, such as delirium, increases with age.  Recommended avoiding abrupt discontinuation of alcohol since withdrawal may occur if she has been drinking 3 or more drinks daily for several months.  Discussed signs of alcohol withdrawal, to include increased heart rate and blood  pressure, tremor, sweats, or possibly confusion.  Recommended decreasing alcohol intake by 1 drink daily each week until she is no longer drinking daily.  Patient asks about how much alcohol would be okay to continue.  Discussed that 1-2 drinks once or twice a week is considered to be at the range of drinking amount since before being considered alcohol misuse.  Discussed occasional alcohol use of 1-2 drinks at least several hours before bedtime may be a more reasonable intake amount. Recommended considering seeing Angie Cooper, LCSW,LCAS again for therapy.  Discussed that Angie Liu is also a trying licensed clinical addiction specialist and would be able to provide help regarding alcohol use. Encouraged patient to identify the reasons she started to drink alcohol more regularly and what needed she believes alcohol has filled, and then considering alternative ways to meet this need or desire.   Both patient and her husband report that medications are currently effective and do not see a need for a medication change at this time.  Discussed that she is likely to experience a gradual, continued improvement in anxiety and sleep disturbance after stopping regular alcohol use. Patient advised to contact office with any questions, adverse effects, or acute worsening in signs and symptoms.   Angie Liu was seen today for other.  Diagnoses and all orders for this visit:  Alcohol use, unspecified, uncomplicated  Generalized anxiety disorder     Please see After Visit Summary for patient specific instructions.  Future Appointments  Date Time Provider [place]  11/10/2021  1:30 PM Rankin, Clent Demark, MD [facility] None  01/12/2022 10:15 AM Rankin, Clent Demark, MD [facility] None  01/21/2022  1:00 PM Copland, Gay Filler, MD [facility]  04/06/2022 11:00 AM Thayer Headings, PMHNP CP-CP None    No orders of the defined types were placed in this encounter.   -------------------------------

## 2021-11-10 ENCOUNTER — Encounter (INDEPENDENT_AMBULATORY_CARE_PROVIDER_SITE_OTHER): Payer: Self-pay | Admitting: Ophthalmology

## 2021-11-10 ENCOUNTER — Other Ambulatory Visit: Payer: Self-pay

## 2021-11-10 ENCOUNTER — Ambulatory Visit (INDEPENDENT_AMBULATORY_CARE_PROVIDER_SITE_OTHER): Payer: PPO | Admitting: Ophthalmology

## 2021-11-10 DIAGNOSIS — H353211 Exudative age-related macular degeneration, right eye, with active choroidal neovascularization: Secondary | ICD-10-CM | POA: Diagnosis not present

## 2021-11-10 DIAGNOSIS — H353221 Exudative age-related macular degeneration, left eye, with active choroidal neovascularization: Secondary | ICD-10-CM | POA: Diagnosis not present

## 2021-11-10 MED ORDER — AFLIBERCEPT 2MG/0.05ML IZ SOLN FOR KALEIDOSCOPE
2.0000 mg | INTRAVITREAL | Status: AC | PRN
  Administered 2021-11-10: 2 mg via INTRAVITREAL

## 2021-11-10 MED ORDER — BEVACIZUMAB 2.5 MG/0.1ML IZ SOSY: 2.5000 mg | PREFILLED_SYRINGE | INTRAVITREAL | Status: DC | PRN

## 2021-11-10 NOTE — Assessment & Plan Note (Signed)
OS today at 4 months, 15 weeks post most recent injection, small area of fluids temporal to fovea, repeat injection antivegF OS now on examination next in 14 weeks

## 2021-11-10 NOTE — Progress Notes (Signed)
11/10/2021     CHIEF COMPLAINT Patient presents for  Chief Complaint  Patient presents with   Retina Follow Up      HISTORY OF PRESENT ILLNESS: Angie Liu is a 76 y.o. female who presents to the clinic today for:   HPI     Retina Follow Up           Diagnosis: Wet AMD   Laterality: left eye   Onset: 3 months ago   Severity: mild   Duration: 3 months   Course: stable         Comments   3 month fu OS and OCT, Eylea OS- exudative ARMD OS- Last injection was  07/28/2021  Pt states VA OU stable since last visit. Pt denies FOL, floaters, or ocular pain OU.           Last edited by Kendra Opitz, COA on 11/10/2021  1:32 PM.      Referring physician: Darreld Mclean, MD [address] [place],  [place] [address]  HISTORICAL INFORMATION:   Selected notes from the MEDICAL RECORD NUMBER       CURRENT MEDICATIONS: No current outpatient medications on file. (Ophthalmic Drugs)   No current facility-administered medications for this visit. (Ophthalmic Drugs)   Current Outpatient Medications (Other)  Medication Sig   ALPRAZolam (XANAX) 0.5 MG tablet take one tablet by mouth at bedtime and one tablet by mouth every day as needed for anxiety   b complex vitamins capsule Take 1 capsule by mouth daily.   doxepin (SINEQUAN) 100 MG capsule Take 1 capsule (100 mg total) by mouth at bedtime.   Multiple Vitamin (MULTIVITAMIN) capsule Take 1 capsule by mouth daily.   Multiple Vitamins-Minerals (PRESERVISION AREDS 2 PO) Take by mouth.   OVER THE COUNTER MEDICATION 4 capsules daily. Calm CP, takes 1 breakfast, 1 at dinner and 2 at bedtime   OVER THE COUNTER MEDICATION Alpha gabba   pregabalin (LYRICA) 25 MG capsule Take 1 capsule (25 mg total) by mouth 2 (two) times daily. (Patient not taking: Reported on 10/06/2021)   Prenatal Vit-Fe Fumarate-FA (PRENATAL VITAMINS PO) Take by mouth.   progesterone (PROMETRIUM) 100 MG capsule Take 1 capsule (100 mg total)  by mouth daily.   QUERCETIN PO Take by mouth.   traMADol (ULTRAM) 50 MG tablet Take 50 mg by mouth every 6 (six) hours as needed.   venlafaxine XR (EFFEXOR XR) 37.5 MG 24 hr capsule Take 1 capsule (37.5 mg total) by mouth 2 (two) times daily. (Patient taking differently: Take 37.5 mg by mouth daily with breakfast.)   [START ON 11/26/2021] zolpidem (AMBIEN CR) 12.5 MG CR tablet Take 1 tablet (12.5 mg total) by mouth at bedtime as needed for sleep.   No current facility-administered medications for this visit. (Other)      REVIEW OF SYSTEMS:    ALLERGIES Allergies  Allergen Reactions   Dust Mite Extract    Latex Rash    PAST MEDICAL HISTORY Past Medical History:  Diagnosis Date   Anxiety    Depression    Hayfever    Past Surgical History:  Procedure Laterality Date   CATARACT EXTRACTION W/PHACO Bilateral 2018   TONSILLECTOMY      FAMILY HISTORY Family History  Problem Relation Age of Onset   Cancer Mother        colon   Depression Mother    Diabetes Father    Depression Father    Depression Son  Depression Paternal Uncle     SOCIAL HISTORY Social History   Tobacco Use   Smoking status: Never   Smokeless tobacco: Never  Substance Use Topics   Alcohol use: Yes    Alcohol/week: 2.0 standard drinks    Types: 2 Glasses of wine per week    Comment: 2 glass a day   Drug use: No         OPHTHALMIC EXAM:  Base Eye Exam     Visual Acuity (ETDRS)       Right Left   Dist cc 20/60 20/40 -1   Dist ph cc 20/40 NI    Correction: Glasses         Tonometry (Tonopen, 1:37 PM)       Right Left   Pressure 10 10         Pupils       Pupils Dark Light Shape React APD   Right PERRL 4 3 Round Brisk None   Left PERRL 4 3 Round Brisk None         Visual Fields (Counting fingers)       Left Right    Full Full         Extraocular Movement       Right Left    Full, Ortho Full, Ortho         Neuro/Psych     Oriented x3: Yes    Mood/Affect: Normal         Dilation     Left eye: 1.0% Mydriacyl, 2.5% Phenylephrine @ 1:37 PM           Slit Lamp and Fundus Exam     External Exam       Right Left   External Normal Normal         Slit Lamp Exam       Right Left   Lids/Lashes Normal Normal   Conjunctiva/Sclera White and quiet White and quiet   Cornea Clear Clear   Anterior Chamber Deep and quiet Deep and quiet   Iris Round and reactive Round and reactive   Lens Posterior chamber intraocular lens Posterior chamber intraocular lens   Anterior Vitreous Normal Normal         Fundus Exam       Right Left   Posterior Vitreous  Posterior vitreous detachment   Disc  Normal   C/D Ratio  0.6   Macula  Atrophy, Retinal pigment epithelial detachment, Drusen, no exudates, no hemorrhage, no macular thickening, Soft drusen, Intermediate age related macular degeneration, Pigmented atrophy   Vessels  Normal   Periphery  Normal            IMAGING AND PROCEDURES  Imaging and Procedures for 11/10/21  Intravitreal Injection, Pharmacologic Agent - OS - Left Eye       Time Out 11/10/2021. 2:35 PM. Confirmed correct patient, procedure, site, and patient consented.   Anesthesia Topical anesthesia was used. Anesthetic medications included Akten 3.5%.   Procedure Preparation included 10% betadine to eyelids, 5% betadine to ocular surface, Tobramycin 0.3%. A 30 gauge needle was used.   Injection: 2.5 mg bevacizumab 2.5 MG/0.1ML   Route: Intravitreal, Site: Left Eye   NDC: [phone], Lot: [id number]   Post-op Post injection exam found visual acuity of at least counting fingers. The patient tolerated the procedure well. There were no complications. The patient received written and verbal post procedure care education. Post injection medications included ocuflox.      OCT, Retina -  OU - Both Eyes       Right Eye Quality was good. Scan locations included subfoveal. Central Foveal Thickness: 266.  Progression has been stable. Findings include pigment epithelial detachment, retinal drusen , no SRF, no IRF.   Left Eye Quality was good. Scan locations included subfoveal. Central Foveal Thickness: 250. Progression has improved. Findings include no IRF, subretinal fluid.   Notes OS, now 15 weeks post therapy OS , and much smaller vascularized PED and a small regional of subretinal fluid previously in the foveal location as compared to November 2020.  Still with residual subretinal fluid OD at 3 week post injection Eylea OD, repeat examination as scheduled in future to prevent vascularized PED progression and keep condition stabilized             ASSESSMENT/PLAN:  Exudative age-related macular degeneration of right eye with active choroidal neovascularization (HCC) OD now at 3 weeks 6 days post most recent injection follow-up as scheduled  Exudative age-related macular degeneration of left eye with active choroidal neovascularization ([place]) OS today at 4 months, 15 weeks post most recent injection, small area of fluids temporal to fovea, repeat injection antivegF OS now on examination next in 14 weeks     ICD-10-CM   1. Exudative age-related macular degeneration of left eye with active choroidal neovascularization (HCC)  H35.3221 Intravitreal Injection, Pharmacologic Agent - OS - Left Eye    OCT, Retina - OU - Both Eyes    bevacizumab (AVASTIN) SOSY 2.5 mg    2. Exudative age-related macular degeneration of right eye with active choroidal neovascularization ([place])  H35.3211       1.  OS, vastly improved overall since onset of disease November 2020 yet still with small area of active subretinal fluid along with vascularized PED inferotemporal to [place].  At 15-week interval today.  We will repeat injection today and maintain 14-week follow-up OS next  2.  OD at 3 weeks post most recent injection follow-up as scheduled  3.  Ophthalmic Meds Ordered this visit:  Meds ordered this  encounter  Medications   bevacizumab (AVASTIN) SOSY 2.5 mg       Return in about 14 weeks (around 02/16/2022) for dilate, OS, EYLEA OCT.  There are no Patient Instructions on file for this visit.   Explained the diagnoses, plan, and follow up with the patient and they expressed understanding.  Patient expressed understanding of the importance of proper follow up care.   Clent Demark Chelbie Jarnagin M.D. Diseases & Surgery of the Retina and Vitreous [facility] [place] 11/10/21     Abbreviations: M myopia (nearsighted); A astigmatism; H hyperopia (farsighted); P presbyopia; Mrx spectacle prescription;  CTL contact lenses; OD right eye; OS left eye; OU both eyes  XT exotropia; ET esotropia; PEK punctate epithelial keratitis; PEE punctate epithelial erosions; DES dry eye syndrome; MGD meibomian gland dysfunction; ATs artificial tears; PFAT's preservative free artificial tears; [place] nuclear sclerotic cataract; PSC posterior subcapsular cataract; ERM epi-retinal membrane; PVD posterior vitreous detachment; RD retinal detachment; DM diabetes mellitus; DR diabetic retinopathy; NPDR non-proliferative diabetic retinopathy; PDR proliferative diabetic retinopathy; CSME clinically significant macular edema; DME diabetic macular edema; dbh dot blot hemorrhages; CWS cotton wool spot; POAG primary open angle glaucoma; C/D cup-to-disc ratio; HVF humphrey visual field; GVF goldmann visual field; OCT optical coherence tomography; IOP intraocular pressure; BRVO Branch retinal vein occlusion; CRVO central retinal vein occlusion; CRAO central retinal artery occlusion; BRAO branch retinal artery occlusion; RT retinal tear; SB scleral buckle; PPV pars  plana vitrectomy; VH Vitreous hemorrhage; PRP panretinal laser photocoagulation; IVK intravitreal kenalog; VMT vitreomacular traction; MH Macular hole;  NVD neovascularization of the disc; NVE neovascularization elsewhere; AREDS age related eye disease study; ARMD age related  macular degeneration; POAG primary open angle glaucoma; EBMD epithelial/anterior basement membrane dystrophy; ACIOL anterior chamber intraocular lens; IOL intraocular lens; PCIOL posterior chamber intraocular lens; Phaco/IOL phacoemulsification with intraocular lens placement; [place] photorefractive keratectomy; LASIK laser assisted in situ keratomileusis; HTN hypertension; DM diabetes mellitus; COPD chronic obstructive pulmonary disease

## 2021-11-10 NOTE — Assessment & Plan Note (Signed)
OD now at 3 weeks 6 days post most recent injection follow-up as scheduled

## 2021-12-02 DIAGNOSIS — H6121 Impacted cerumen, right ear: Secondary | ICD-10-CM | POA: Diagnosis not present

## 2021-12-30 DIAGNOSIS — Z85828 Personal history of other malignant neoplasm of skin: Secondary | ICD-10-CM | POA: Diagnosis not present

## 2021-12-30 DIAGNOSIS — L309 Dermatitis, unspecified: Secondary | ICD-10-CM | POA: Diagnosis not present

## 2021-12-30 DIAGNOSIS — L570 Actinic keratosis: Secondary | ICD-10-CM | POA: Diagnosis not present

## 2021-12-30 DIAGNOSIS — D225 Melanocytic nevi of trunk: Secondary | ICD-10-CM | POA: Diagnosis not present

## 2021-12-30 DIAGNOSIS — D2271 Melanocytic nevi of right lower limb, including hip: Secondary | ICD-10-CM | POA: Diagnosis not present

## 2021-12-30 DIAGNOSIS — L821 Other seborrheic keratosis: Secondary | ICD-10-CM | POA: Diagnosis not present

## 2021-12-30 DIAGNOSIS — L814 Other melanin hyperpigmentation: Secondary | ICD-10-CM | POA: Diagnosis not present

## 2021-12-30 DIAGNOSIS — D2272 Melanocytic nevi of left lower limb, including hip: Secondary | ICD-10-CM | POA: Diagnosis not present

## 2021-12-30 DIAGNOSIS — D1801 Hemangioma of skin and subcutaneous tissue: Secondary | ICD-10-CM | POA: Diagnosis not present

## 2022-01-12 ENCOUNTER — Ambulatory Visit (INDEPENDENT_AMBULATORY_CARE_PROVIDER_SITE_OTHER): Payer: PPO | Admitting: Ophthalmology

## 2022-01-12 ENCOUNTER — Encounter: Payer: Self-pay | Admitting: Family Medicine

## 2022-01-12 ENCOUNTER — Encounter (INDEPENDENT_AMBULATORY_CARE_PROVIDER_SITE_OTHER): Payer: Self-pay | Admitting: Ophthalmology

## 2022-01-12 ENCOUNTER — Other Ambulatory Visit: Payer: Self-pay

## 2022-01-12 ENCOUNTER — Encounter (INDEPENDENT_AMBULATORY_CARE_PROVIDER_SITE_OTHER): Payer: PPO | Admitting: Ophthalmology

## 2022-01-12 DIAGNOSIS — H353211 Exudative age-related macular degeneration, right eye, with active choroidal neovascularization: Secondary | ICD-10-CM | POA: Diagnosis not present

## 2022-01-12 DIAGNOSIS — H353221 Exudative age-related macular degeneration, left eye, with active choroidal neovascularization: Secondary | ICD-10-CM

## 2022-01-12 MED ORDER — AFLIBERCEPT 2MG/0.05ML IZ SOLN FOR KALEIDOSCOPE
2.0000 mg | INTRAVITREAL | Status: AC | PRN
  Administered 2022-01-12: 2 mg via INTRAVITREAL

## 2022-01-12 NOTE — Progress Notes (Signed)
? ? ?01/12/2022 ? ?  ? ?CHIEF COMPLAINT ?Patient presents for  ?Chief Complaint  ?Patient presents with  ? Macular Degeneration  ? ? ? ? ?HISTORY OF PRESENT ILLNESS: ?Angie Liu is a 76 y.o. female who presents to the clinic today for:  ? ?HPI   ?3 mos dilate OD, Eylea OD, OCT. ?Patient states vision is stable and unchanged since last visit. Denies any new floaters or FOL. ?Pt takes Preservision BID.  ?Last edited by Laurin Coder on 01/12/2022  3:10 PM.  ?  ? ? ?Referring physician: ?Copland, Gay Filler, MD ?[address] ?[place],  [place] [id number] ? ?HISTORICAL INFORMATION:  ? ?Selected notes from the [facility] ?  ?   ? ?CURRENT MEDICATIONS: ?No current outpatient medications on file. (Ophthalmic Drugs)  ? ?No current facility-administered medications for this visit. (Ophthalmic Drugs)  ? ?Current Outpatient Medications (Other)  ?Medication Sig  ? ALPRAZolam (XANAX) 0.5 MG tablet take one tablet by mouth at bedtime and one tablet by mouth every day as needed for anxiety  ? b complex vitamins capsule Take 1 capsule by mouth daily.  ? doxepin (SINEQUAN) 100 MG capsule Take 1 capsule (100 mg total) by mouth at bedtime.  ? Multiple Vitamin (MULTIVITAMIN) capsule Take 1 capsule by mouth daily.  ? Multiple Vitamins-Minerals (PRESERVISION AREDS 2 PO) Take by mouth.  ? OVER THE COUNTER MEDICATION 4 capsules daily. Calm CP, takes 1 breakfast, 1 at dinner and 2 at bedtime  ? OVER THE COUNTER MEDICATION Alpha gabba  ? pregabalin (LYRICA) 25 MG capsule Take 1 capsule (25 mg total) by mouth 2 (two) times daily. (Patient not taking: Reported on 10/06/2021)  ? Prenatal Vit-Fe Fumarate-FA (PRENATAL VITAMINS PO) Take by mouth.  ? progesterone (PROMETRIUM) 100 MG capsule Take 1 capsule (100 mg total) by mouth daily.  ? QUERCETIN PO Take by mouth.  ? traMADol (ULTRAM) 50 MG tablet Take 50 mg by mouth every 6 (six) hours as needed.  ? venlafaxine XR (EFFEXOR XR) 37.5 MG 24 hr capsule Take 1 capsule (37.5 mg  total) by mouth 2 (two) times daily. (Patient taking differently: Take 37.5 mg by mouth daily with breakfast.)  ? zolpidem (AMBIEN CR) 12.5 MG CR tablet Take 1 tablet (12.5 mg total) by mouth at bedtime as needed for sleep.  ? ?No current facility-administered medications for this visit. (Other)  ? ? ? ? ?REVIEW OF SYSTEMS: ? ? ? ?ALLERGIES ?Allergies  ?Allergen Reactions  ? Dust Mite Extract   ? Latex Rash  ? ? ?PAST MEDICAL HISTORY ?Past Medical History:  ?Diagnosis Date  ? Anxiety   ? Depression   ? Hayfever   ? ?Past Surgical History:  ?Procedure Laterality Date  ? CATARACT EXTRACTION W/PHACO Bilateral 2018  ? TONSILLECTOMY    ? ? ?FAMILY HISTORY ?Family History  ?Problem Relation Age of Onset  ? Cancer Mother   ?     colon  ? Depression Mother   ? Diabetes Father   ? Depression Father   ? Depression Son   ? Depression Paternal Uncle   ? ? ?SOCIAL HISTORY ?Social History  ? ?Tobacco Use  ? Smoking status: Never  ? Smokeless tobacco: Never  ?Substance Use Topics  ? Alcohol use: Yes  ?  Alcohol/week: 2.0 standard drinks  ?  Types: 2 Glasses of wine per week  ?  Comment: 2 glass a day  ? Drug use: No  ? ?  ? ?  ? ?  OPHTHALMIC EXAM: ? ?Base Eye Exam   ? ? Visual Acuity (ETDRS)   ? ?   Right Left  ? Dist cc 20/40 +1 20/30 -1+2  ? Dist ph cc NI   ? ? Correction: Glasses  ? ?  ?  ? ? Tonometry (Tonopen, 3:13 PM)   ? ?   Right Left  ? Pressure 15 22  ? ?  ?  ? ? Pupils   ? ?   Pupils Dark Light APD  ? Right PERRL 4 3 None  ? Left PERRL 4 3 None  ? ?  ?  ? ? Extraocular Movement   ? ?   Right Left  ?  Full Full  ? ?  ?  ? ? Neuro/Psych   ? ? Oriented x3: Yes  ? Mood/Affect: Normal  ? ?  ?  ? ? Dilation   ? ? Right eye: 1.0% Mydriacyl, 2.5% Phenylephrine @ 3:13 PM  ? ?  ?  ? ?  ? ?Slit Lamp and Fundus Exam   ? ? External Exam   ? ?   Right Left  ? External Normal Normal  ? ?  ?  ? ? Slit Lamp Exam   ? ?   Right Left  ? Lids/Lashes Normal Normal  ? Conjunctiva/Sclera White and quiet White and quiet  ? Cornea Clear Clear  ?  Anterior Chamber Deep and quiet Deep and quiet  ? Iris Round and reactive Round and reactive  ? Lens Posterior chamber intraocular lens Posterior chamber intraocular lens  ? Anterior Vitreous Normal Normal  ? ?  ?  ? ? Fundus Exam   ? ?   Right Left  ? Posterior Vitreous Posterior vitreous detachment   ? Disc Normal   ? C/D Ratio 0.55   ? Macula no hemorrhage, no exudates, Retinal pigment epithelial mottling, no macular thickening, Retinal pigment epithelial detachment, Soft drusen   ? Vessels Normal   ? Periphery Normal   ? ?  ?  ? ?  ? ? ?IMAGING AND PROCEDURES  ?Imaging and Procedures for 01/12/22 ? ?Intravitreal Injection, Pharmacologic Agent - OD - Right Eye   ? ?   ?Time Out ?01/12/2022. 4:09 PM. Confirmed correct patient, procedure, site, and patient consented.  ? ?Anesthesia ?Topical anesthesia was used. Anesthetic medications included Lidocaine 4%.  ? ?Procedure ?Preparation included Ofloxacin , Tobramycin 0.3%, 10% betadine to eyelids, 5% betadine to ocular surface. A 30 gauge needle was used.  ? ?Injection: ?2 mg aflibercept 2 MG/0.05ML ?  Route: Intravitreal, Site: Right Eye ?  [place]: [id number], Lot: [id number], Waste: 0 mL  ? ?Post-op ?Post injection exam found visual acuity of at least counting fingers. The patient tolerated the procedure well. There were no complications. The patient received written and verbal post procedure care education. Post injection medications included ocuflox.  ? ?  ? ?OCT, Retina - OU - Both Eyes   ? ?   ?Right Eye ?Quality was good. Scan locations included subfoveal. Central Foveal Thickness: 292. Progression has been stable. Findings include pigment epithelial detachment, retinal drusen , no SRF, no IRF.  ? ?Left Eye ?Quality was good. Scan locations included subfoveal. Central Foveal Thickness: 264. Progression has improved. Findings include no IRF, subretinal fluid.  ? ?Notes ?OS, now 9 weeks weeks post therapy OS , with slight increase in fluid subfoveal, will need to  repeat evaluation formally OS next week and likely injection OS next week ? ?OD at 3 months post  injection Eylea OD, repeat injection today for large subfoveal PED risk for wet AMD ? ?  ? ? ?  ?  ? ?  ?ASSESSMENT/PLAN: ? ?Exudative age-related macular degeneration of right eye with active choroidal neovascularization ([place]) ?OD, with subfoveal vascularized PED, large multilobular, stable at 6-monthfollow-up interval post Eylea repeat injection today and maintain 347-monthollow-up ? ?Exudative age-related macular degeneration of left eye with active choroidal neovascularization ([facility]?OS, at current 9-week follow-up at a planned 15 or 16-week follow-up, reactivation of disease with subretinal fluid, will repeat formal evaluation next week with intravitreal Eylea next week  ? ?  ICD-10-CM   ?1. Exudative age-related macular degeneration of right eye with active choroidal neovascularization (HCC)  H35.3211 Intravitreal Injection, Pharmacologic Agent - OD - Right Eye  ?  OCT, Retina - OU - Both Eyes  ?  aflibercept (EYLEA) SOLN 2 mg  ?  ?2. Exudative age-related macular degeneration of left eye with active choroidal neovascularization ([facility] H35.3221   ?  ? ? ?1.  OD chronic stable subfoveal large vascularized PED, no increase in size or extent, stable acuity repeat injection today at 3-62-monthllow-up and maintain 3-m59-monthlow-up OD ? ?2.  Dilate OU S next week likely Eylea injection OS next week ? ?3. ? ?Ophthalmic Meds Ordered this visit:  ?Meds ordered this encounter  ?Medications  ? aflibercept (EYLEA) SOLN 2 mg  ? ? ?  ? ?Return in about 3 months (around 04/14/2022) for dilate, OD, EYLEA OCT,,, and OS Eylea OCT next week. ? ?There are no Patient Instructions on file for this visit. ? ? ?Explained the diagnoses, plan, and follow up with the patient and they expressed understanding.  Patient expressed understanding of the importance of proper follow up care.  ? ?GaryClent Demarknkin M.D. ?Diseases & Surgery of the Retina  and Vitreous ?[place]/14/23 ? ? ? ? ?Abbreviations: ?M myopia (nearsighted); A astigmatism; H hyperopia (farsighted); P presbyopia; Mrx spectacle prescription;  CTL contact len

## 2022-01-12 NOTE — Assessment & Plan Note (Signed)
OD, with subfoveal vascularized PED, large multilobular, stable at 41-monthfollow-up interval post Eylea repeat injection today and maintain 311-monthollow-up ?

## 2022-01-12 NOTE — Assessment & Plan Note (Signed)
OS, at current 9-week follow-up at a planned 15 or 16-week follow-up, reactivation of disease with subretinal fluid, will repeat formal evaluation next week with intravitreal Eylea next week ?

## 2022-01-17 NOTE — Progress Notes (Addendum)
[facility] [address], [address] [place], [place] [address] [phone]  Date:  01/21/2022   Name:  Angie Liu   DOB:  1946/03/28   MRN:  [id number]  PCP:  Pearline Cables, MD    Chief Complaint: 6 month follow up (Concerns/ questions: balance, she has had a couple of falls)   History of Present Illness:  Angie Liu is a 76 y.o. very pleasant female patient who presents with the following:  Pt seen today for a periodic follow-up visit Last seen by myself about 6 months ago   Get's colon every 5 years per Dr Ewing Schlein  Her mental health care provider is Corie Chiquito She uses progesterone for insomnia   Covid series- done  Hep C screening Shingrix- done  Pneumonia - will give today  Mammo one year ago   Labs done in July   Pt notes she fell getting out of her car about a month ago- did not get hurt However last week she fell again- she got up to use the bathroom early in the morning and fell onto her left elbow  She did not hit her head but she did sustain a skin tear of her left elbow Tetanus is up-to-date  Wt Readings from Last 3 Encounters:  01/21/22 173 lb 12.8 oz (78.8 kg)  07/23/21 163 lb (73.9 kg)  06/11/21 163 lb 8 oz (74.2 kg)   She notes she is gaining weight- this worries her husband more than it does her   Her retinal specialist is Dr Charlesetta Garibaldi follows up with him regularly  Pt notes she is drinking 1-1.5 glasses wine per night which she does enjoy - however she wonders if this is safe.  I advised her I would recommend not combining alcohol with Ambien and alprazolam-this combination may be dangerous She is fasting today for labs   She would like a HD placard to have in case she needs it-this is okay, but I encouraged her to try to walk when she can in order to keep up her strength Patient admits she is not getting a lot of exercise, she has a difficult time walking a mile.  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   Exudative age-related macular degeneration of right eye with active choroidal neovascularization (HCC) 06/04/2020   Exudative age-related macular degeneration of left eye with active choroidal neovascularization (HCC) 02/07/2020   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   Posterior vitreous detachment of left eye 02/07/2020   Intermediate stage nonexudative age-related macular degeneration of left eye 02/07/2020   Intermediate stage nonexudative age-related macular degeneration of right eye 02/07/2020   GAD (generalized anxiety disorder) 11/28/2018   Recurrent major depression in partial remission (HCC) 11/28/2018   Chronic insomnia 02/02/2018   Depression 02/02/2018   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   Excessive cerumen in both ear canals 10/17/2017   Sensorineural hearing loss (  SNHL), 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   Tinnitus of both ears 10/17/2017   Bilateral impacted cerumen 08/05/2016   Visit for screening mammogram 07/18/2014   Allergic rhinitis, seasonal 10/23/2012   Depression with anxiety 10/23/2012    Past Medical History:  Diagnosis Date   Anxiety    Depression    Hayfever     Past Surgical History:  Procedure Laterality Date   CATARACT EXTRACTION W/PHACO Bilateral 2018   TONSILLECTOMY      Social History   Tobacco Use   Smoking status: Never   Smokeless tobacco: Never  Substance Use Topics    Alcohol use: Yes    Alcohol/week: 2.0 standard drinks    Types: 2 Glasses of wine per week    Comment: 2 glass a day   Drug use: No    Family History  Problem Relation Age of Onset   Cancer Mother        colon   Depression Mother    Diabetes Father    Depression Father    Depression Son    Depression Paternal Uncle     Allergies  Allergen Reactions   Dust Mite Extract    Latex Rash    Medication list has been reviewed and updated.  Current Outpatient Medications on File Prior to Visit  Medication Sig Dispense Refill   ALPRAZolam (XANAX) 0.5 MG tablet take one tablet by mouth at bedtime and one tablet by mouth every day as needed for anxiety 180 tablet 1   b complex vitamins capsule Take 1 capsule by mouth daily.     Multiple Vitamin (MULTIVITAMIN) capsule Take 1 capsule by mouth daily.     Multiple Vitamins-Minerals (PRESERVISION AREDS 2 PO) Take by mouth.     OVER THE COUNTER MEDICATION 4 capsules daily. Calm CP, takes 1 breakfast, 1 at dinner and 2 at bedtime     OVER THE COUNTER MEDICATION Alpha gabba     Prenatal Vit-Fe Fumarate-FA (PRENATAL VITAMINS PO) Take by mouth.     progesterone (PROMETRIUM) 100 MG capsule Take 1 capsule (100 mg total) by mouth daily. 90 capsule 3   QUERCETIN PO Take by mouth.     traMADol (ULTRAM) 50 MG tablet Take 50 mg by mouth every 6 (six) hours as needed.     zolpidem (AMBIEN CR) 12.5 MG CR tablet Take 1 tablet (12.5 mg total) by mouth at bedtime as needed for sleep. 90 tablet 1   doxepin (SINEQUAN) 100 MG capsule Take 1 capsule (100 mg total) by mouth at bedtime. 90 capsule 1   venlafaxine XR (EFFEXOR XR) 37.5 MG 24 hr capsule Take 1 capsule (37.5 mg total) by mouth 2 (two) times daily. (Patient taking differently: Take 37.5 mg by mouth daily with breakfast.) 180 capsule 0   No current facility-administered medications on file prior to visit.    Review of Systems:  As per HPI- otherwise negative.   Physical Examination: Vitals:    01/21/22 1302 01/21/22 1303  BP:  110/60  Pulse:  86  Resp: 18 18  Temp:  98.1 F (36.7 C)  SpO2:  98%   Vitals:   01/21/22 1303  Weight: 173 lb 12.8 oz (78.8 kg)   Body mass index is 28.05 kg/m. Ideal Body Weight:    GEN: no acute distress.  Overweight, otherwise looks well HEENT: Atraumatic, Normocephalic.  Ears and Nose: No external deformity. CV: RRR, No M/G/R. No JVD. No thrill. No extra heart sounds. PULM: CTA B, no wheezes, crackles, rhonchi. No retractions.  No resp. distress. No accessory muscle use. ABD: S, NT, ND, +BS. No rebound. No HSM. EXTR: No c/c/e PSYCH: Normally interactive. Conversant.  Skin tear over left elbow.  This appears to be healing well without evidence of infection.  No pain with elbow range of motion in all aspects, no tenderness over the radial head  Assessment and Plan: Screening for deficiency anemia - Plan: CBC  Screening for diabetes mellitus - Plan: Comprehensive metabolic panel, Hemoglobin A1c  Screening for hyperlipidemia - Plan: Lipid panel  Screening for thyroid disorder - Plan: TSH  Immunization due - Plan: Pneumococcal conjugate vaccine 20-valent (Prevnar 20)  Estrogen deficiency - Plan: DG Bone Density  Physical deconditioning  Patient seen today for follow-up She gets her bone density at [facility] for her today Gave pneumonia vaccine Encouraged her to work on exercise, she may be able to do physical therapy for strength and balance at her gym [employer].  I am glad to provide an order for this if needed  Will plan further follow- up pending labs.   Signed Abbe Amsterdam, MD  Addendum 3/28, received labs as follows Message to patient  Results for orders placed or performed in visit on 01/21/22  CBC  Result Value Ref Range   WBC 5.6 4.0 - 10.5 K/uL   RBC 4.06 3.87 - 5.11 Mil/uL   Platelets 244.0 150.0 - 400.0 K/uL   Hemoglobin 13.8 12.0 - 15.0 g/dL   HCT 40.9 81.1 - 91.4 %   MCV 101.6 (H) 78.0 - 100.0 fl    MCHC 33.4 30.0 - 36.0 g/dL   RDW 78.2 95.6 - 21.3 %  Comprehensive metabolic panel  Result Value Ref Range   Sodium 138 135 - 145 mEq/L   Potassium 4.5 3.5 - 5.1 mEq/L   Chloride 105 96 - 112 mEq/L   CO2 27 19 - 32 mEq/L   Glucose, Bld 108 (H) 70 - 99 mg/dL   BUN 19 6 - 23 mg/dL   Creatinine, Ser 0.86 0.40 - 1.20 mg/dL   Total Bilirubin 0.5 0.2 - 1.2 mg/dL   Alkaline Phosphatase 98 39 - 117 U/L   AST 22 0 - 37 U/L   ALT 21 0 - 35 U/L   Total Protein 6.5 6.0 - 8.3 g/dL   Albumin 4.8 3.5 - 5.2 g/dL   GFR 57.84 >69.62 mL/min   Calcium 9.0 8.4 - 10.5 mg/dL  Hemoglobin X5M  Result Value Ref Range   Hgb A1c MFr Bld 5.8 4.6 - 6.5 %  Lipid panel  Result Value Ref Range   Cholesterol 227 (H) 0 - 200 mg/dL   Triglycerides 84.1 0.0 - 149.0 mg/dL   HDL 32.44 >01.02 mg/dL   VLDL 72.5 0.0 - 36.6 mg/dL   LDL Cholesterol 440 (H) 0 - 99 mg/dL   Total CHOL/HDL Ratio 3    NonHDL 136.59   TSH  Result Value Ref Range   TSH 2.32 0.35 - 5.50 uIU/mL

## 2022-01-19 ENCOUNTER — Other Ambulatory Visit: Payer: Self-pay

## 2022-01-19 ENCOUNTER — Ambulatory Visit (INDEPENDENT_AMBULATORY_CARE_PROVIDER_SITE_OTHER): Payer: PPO | Admitting: Ophthalmology

## 2022-01-19 DIAGNOSIS — H353221 Exudative age-related macular degeneration, left eye, with active choroidal neovascularization: Secondary | ICD-10-CM

## 2022-01-19 DIAGNOSIS — H353211 Exudative age-related macular degeneration, right eye, with active choroidal neovascularization: Secondary | ICD-10-CM

## 2022-01-19 MED ORDER — AFLIBERCEPT 2MG/0.05ML IZ SOLN FOR KALEIDOSCOPE
2.0000 mg | INTRAVITREAL | Status: AC | PRN
  Administered 2022-01-19: 2 mg via INTRAVITREAL

## 2022-01-19 NOTE — Progress Notes (Signed)
? ? ?01/19/2022 ? ?  ? ?CHIEF COMPLAINT ?Patient presents for  ?Chief Complaint  ?Patient presents with  ? Macular Degeneration  ? ? ? ? ?HISTORY OF PRESENT ILLNESS: ?Angie Liu is a 76 y.o. female who presents to the clinic today for:  ? ?HPI   ?OS today, 10 weeks post most recent examination and evaluation with delivery of antivegF.  Likely repeat of Eylea today and depending on findings. ? ?OD 1 week post most recent examination and delivery of therapy ? ?No change in medical conditions or medicines over the last interval ?Last edited by Hurman Horn, MD on 01/19/2022  2:00 PM.  ?  ? ? ?Referring physician: ?Copland, Gay Filler, MD ?[address] ?[address] ?[place],  [place] [id number] ? ?HISTORICAL INFORMATION:  ? ?Selected notes from the [facility] ?  ?   ? ?CURRENT MEDICATIONS: ?No current outpatient medications on file. (Ophthalmic Drugs)  ? ?No current facility-administered medications for this visit. (Ophthalmic Drugs)  ? ?Current Outpatient Medications (Other)  ?Medication Sig  ? ALPRAZolam (XANAX) 0.5 MG tablet take one tablet by mouth at bedtime and one tablet by mouth every day as needed for anxiety  ? b complex vitamins capsule Take 1 capsule by mouth daily.  ? doxepin (SINEQUAN) 100 MG capsule Take 1 capsule (100 mg total) by mouth at bedtime.  ? Multiple Vitamin (MULTIVITAMIN) capsule Take 1 capsule by mouth daily.  ? Multiple Vitamins-Minerals (PRESERVISION AREDS 2 PO) Take by mouth.  ? OVER THE COUNTER MEDICATION 4 capsules daily. Calm CP, takes 1 breakfast, 1 at dinner and 2 at bedtime  ? OVER THE COUNTER MEDICATION Alpha gabba  ? pregabalin (LYRICA) 25 MG capsule Take 1 capsule (25 mg total) by mouth 2 (two) times daily. (Patient not taking: Reported on 10/06/2021)  ? Prenatal Vit-Fe Fumarate-FA (PRENATAL VITAMINS PO) Take by mouth.  ? progesterone (PROMETRIUM) 100 MG capsule Take 1 capsule (100 mg total) by mouth daily.  ? QUERCETIN PO Take by mouth.  ? traMADol (ULTRAM) 50 MG tablet  Take 50 mg by mouth every 6 (six) hours as needed.  ? venlafaxine XR (EFFEXOR XR) 37.5 MG 24 hr capsule Take 1 capsule (37.5 mg total) by mouth 2 (two) times daily. (Patient taking differently: Take 37.5 mg by mouth daily with breakfast.)  ? zolpidem (AMBIEN CR) 12.5 MG CR tablet Take 1 tablet (12.5 mg total) by mouth at bedtime as needed for sleep.  ? ?No current facility-administered medications for this visit. (Other)  ? ? ? ? ?REVIEW OF SYSTEMS: ?ROS   ?Negative for: Constitutional, Gastrointestinal, Neurological, Skin, Genitourinary, Musculoskeletal, HENT, Endocrine, Cardiovascular, Eyes, Respiratory, Psychiatric, Allergic/Imm, Heme/Lymph ?Last edited by Hurman Horn, MD on 01/19/2022  2:00 PM.  ?  ? ? ? ?ALLERGIES ?Allergies  ?Allergen Reactions  ? Dust Mite Extract   ? Latex Rash  ? ? ?PAST MEDICAL HISTORY ?Past Medical History:  ?Diagnosis Date  ? Anxiety   ? Depression   ? Hayfever   ? ?Past Surgical History:  ?Procedure Laterality Date  ? CATARACT EXTRACTION W/PHACO Bilateral 2018  ? TONSILLECTOMY    ? ? ?FAMILY HISTORY ?Family History  ?Problem Relation Age of Onset  ? Cancer Mother   ?     colon  ? Depression Mother   ? Diabetes Father   ? Depression Father   ? Depression Son   ? Depression Paternal Uncle   ? ? ?SOCIAL HISTORY ?Social History  ? ?Tobacco Use  ?  Smoking status: Never  ? Smokeless tobacco: Never  ?Substance Use Topics  ? Alcohol use: Yes  ?  Alcohol/week: 2.0 standard drinks  ?  Types: 2 Glasses of wine per week  ?  Comment: 2 glass a day  ? Drug use: No  ? ?  ? ?  ? ?OPHTHALMIC EXAM: ? ?Base Eye Exam   ? ? Visual Acuity (ETDRS)   ? ?   Right Left  ? Dist cc 20/40 20/30  ? ?  ?  ? ? Tonometry (Tonopen, 1:41 PM)   ? ?   Right Left  ? Pressure 10 10  ? ?  ?  ? ? Pupils   ? ?   Pupils APD  ? Right PERRL None  ? Left PERRL None  ? ?  ?  ? ? Visual Fields   ? ?   Left Right  ?  Full Full  ? ?  ?  ? ? Extraocular Movement   ? ?   Right Left  ?  Full, Ortho Full, Ortho  ? ?  ?  ? ? Neuro/Psych    ? ? Oriented x3: Yes  ? Mood/Affect: Normal  ? ?  ?  ? ? Dilation   ? ? Left eye: 1.0% Mydriacyl, 2.5% Phenylephrine @ 1:42 PM  ? ?  ?  ? ?  ? ?Slit Lamp and Fundus Exam   ? ? External Exam   ? ?   Right Left  ? External Normal Normal  ? ?  ?  ? ? Slit Lamp Exam   ? ?   Right Left  ? Lids/Lashes Normal Normal  ? Conjunctiva/Sclera White and quiet White and quiet  ? Cornea Clear Clear  ? Anterior Chamber Deep and quiet Deep and quiet  ? Iris Round and reactive Round and reactive  ? Lens Posterior chamber intraocular lens Posterior chamber intraocular lens  ? Anterior Vitreous Normal Normal  ? ?  ?  ? ? Fundus Exam   ? ?   Right Left  ? Posterior Vitreous Posterior vitreous detachment   ? Disc Normal   ? C/D Ratio 0.55   ? Macula no hemorrhage, no exudates, Retinal pigment epithelial mottling, no macular thickening, Retinal pigment epithelial detachment, Soft drusen   ? Vessels Normal   ? Periphery Normal   ? ?  ?  ? ?  ? ? ?IMAGING AND PROCEDURES  ?Imaging and Procedures for 01/19/22 ? ?OCT, Retina - OU - Both Eyes   ? ?   ?Right Eye ?Quality was good. Scan locations included subfoveal. Central Foveal Thickness: 292. Progression has been stable. Findings include pigment epithelial detachment, retinal drusen , no SRF, no IRF.  ? ?Left Eye ?Quality was good. Scan locations included subfoveal. Progression has improved. Findings include no IRF, subretinal fluid.  ? ?Notes ?OS, now 10 weeks weeks post therapy OS , with slight increase in fluid subfoveal, will need to repeat evaluation formally OS in 7 weeks after repeat injection Eylea OS today for increasing fluid ? ? ?OD at 1 week post injection Eylea OD, and follow-up next as scheduled OD ? ?  ? ?Intravitreal Injection, Pharmacologic Agent - OS - Left Eye   ? ?   ?Time Out ?01/19/2022. 2:01 PM. Confirmed correct patient, procedure, site, and patient consented.  ? ?Anesthesia ?Topical anesthesia was used. Anesthetic medications included Lidocaine 4%.   ? ?Procedure ?Preparation included 10% betadine to eyelids, 5% betadine to ocular surface, Tobramycin 0.3%. A 30  gauge needle was used.  ? ?Injection: ?2 mg aflibercept 2 MG/0.05ML ?  Route: Intravitreal, Site: Left Eye ?  NDC: 70177-939-03, Waste: 0 mL  ? ?Post-op ?Post injection exam found visual acuity of at least counting fingers. The patient tolerated the procedure well. There were no complications. The patient received written and verbal post procedure care education. Post injection medications included ocuflox.  ? ?  ? ? ?  ?  ? ?  ?ASSESSMENT/PLAN: ? ?Exudative age-related macular degeneration of right eye with active choroidal neovascularization ([place]) ?OD stable 1 week post most recent injection, follow-up as scheduled ? ?Exudative age-related macular degeneration of left eye with active choroidal neovascularization ([place]) ?OS increase in subretinal fluid at 10-week follow-up interval today, will need to repeat injection today and follow-up next in 7 weeks  ? ?  ICD-10-CM   ?1. Exudative age-related macular degeneration of left eye with active choroidal neovascularization ([facility])  H35.3221 OCT, Retina - OU - Both Eyes  ?  Intravitreal Injection, Pharmacologic Agent - OS - Left Eye  ?  aflibercept (EYLEA) SOLN 2 mg  ?  ?2. Exudative age-related macular degeneration of right eye with active choroidal neovascularization ([place])  H35.3211   ?  ? ? ?1.  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 ? ?2.  OD follow-up as scheduled 1 week today post 3 most recent injection ? ?3. ? ?Ophthalmic Meds Ordered this visit:  ?Meds ordered this encounter  ?Medications  ? aflibercept (EYLEA) SOLN 2 mg  ? ? ?  ? ?Return in about 7 weeks (around 03/09/2022) for dilate, OS, EYLEA OCT,,, and follow-up OD as scheduled. ? ?There are no Patient Instructions on file for this visit. ? ? ?Explained  the diagnoses, plan, and follow up with the patient and they expressed understanding.  Patient expressed understanding of the importance of proper follow up care.  ? ?Clent Demark. Jaimon Bugaj M.D. ?Diseases & Surgery of the Retina [facility]

## 2022-01-19 NOTE — Assessment & Plan Note (Signed)
OD stable 1 week post most recent injection, follow-up as scheduled ?

## 2022-01-19 NOTE — Assessment & Plan Note (Signed)
OS increase in subretinal fluid at 10-week follow-up interval today, will need to repeat injection today and follow-up next in 7 weeks ?

## 2022-01-21 ENCOUNTER — Ambulatory Visit (INDEPENDENT_AMBULATORY_CARE_PROVIDER_SITE_OTHER): Payer: PPO | Admitting: Family Medicine

## 2022-01-21 ENCOUNTER — Encounter (INDEPENDENT_AMBULATORY_CARE_PROVIDER_SITE_OTHER): Payer: PPO | Admitting: Ophthalmology

## 2022-01-21 VITALS — BP 110/60 | HR 86 | Temp 98.1°F | Resp 18 | Wt 173.8 lb

## 2022-01-21 DIAGNOSIS — Z23 Encounter for immunization: Secondary | ICD-10-CM | POA: Diagnosis not present

## 2022-01-21 DIAGNOSIS — Z130 Encounter for screening for diseases of the blood and blood-forming organs and certain disorders involving the immune mechanism: Secondary | ICD-10-CM

## 2022-01-21 DIAGNOSIS — Z13220 Encounter for screening for lipoid disorders: Secondary | ICD-10-CM | POA: Diagnosis not present

## 2022-01-21 DIAGNOSIS — E2839 Other primary ovarian failure: Secondary | ICD-10-CM

## 2022-01-21 DIAGNOSIS — Z131 Encounter for screening for diabetes mellitus: Secondary | ICD-10-CM | POA: Diagnosis not present

## 2022-01-21 DIAGNOSIS — R5381 Other malaise: Secondary | ICD-10-CM

## 2022-01-21 DIAGNOSIS — Z1329 Encounter for screening for other suspected endocrine disorder: Secondary | ICD-10-CM

## 2022-01-21 NOTE — Patient Instructions (Signed)
It was great to see you again today, I will be in touch with your labs ?Bone density is ordered, please give [facility] a call to schedule ? ?Address: [address], [place], [place] [address] ?Phone: [phone] ? ?You got your pneumonia vaccine today ? ?I would recommend doing physical therapy-through your gym is perfectly fine, if they need an order from a please just let me know ?Getting more regular exercise and working on strength may help you avoid having any further falls. ? ?Also, I would not recommend drinking alcohol if you are using Ambien especially!  This can cause dangerous sedation  ?

## 2022-01-22 LAB — CBC
HCT: 41.3 % (ref 36.0–46.0)
Hemoglobin: 13.8 g/dL (ref 12.0–15.0)
MCHC: 33.4 g/dL (ref 30.0–36.0)
MCV: 101.6 fl — ABNORMAL HIGH (ref 78.0–100.0)
Platelets: 244 10*3/uL (ref 150.0–400.0)
RBC: 4.06 Mil/uL (ref 3.87–5.11)
RDW: 13.2 % (ref 11.5–15.5)
WBC: 5.6 10*3/uL (ref 4.0–10.5)

## 2022-01-22 LAB — LIPID PANEL
Cholesterol: 227 mg/dL — ABNORMAL HIGH (ref 0–200)
HDL: 90 mg/dL (ref >39.00)
LDL Cholesterol: 122 mg/dL — ABNORMAL HIGH (ref 0–99)
NonHDL: 136.59
Total CHOL/HDL Ratio: 3
Triglycerides: 73 mg/dL (ref 0.0–149.0)
VLDL: 14.6 mg/dL (ref 0.0–40.0)

## 2022-01-22 LAB — COMPREHENSIVE METABOLIC PANEL
ALT: 21 U/L (ref 0–35)
AST: 22 U/L (ref 0–37)
Albumin: 4.8 g/dL (ref 3.5–5.2)
Alkaline Phosphatase: 98 U/L (ref 39–117)
BUN: 19 mg/dL (ref 6–23)
CO2: 27 mEq/L (ref 19–32)
Calcium: 9 mg/dL (ref 8.4–10.5)
Chloride: 105 mEq/L (ref 96–112)
Creatinine, Ser: 0.73 mg/dL (ref 0.40–1.20)
GFR: 79.3 mL/min (ref >60.00)
Glucose, Bld: 108 mg/dL — ABNORMAL HIGH (ref 70–99)
Potassium: 4.5 mEq/L (ref 3.5–5.1)
Sodium: 138 mEq/L (ref 135–145)
Total Bilirubin: 0.5 mg/dL (ref 0.2–1.2)
Total Protein: 6.5 g/dL (ref 6.0–8.3)

## 2022-01-22 LAB — HEMOGLOBIN A1C: Hgb A1c MFr Bld: 5.8 % (ref 4.6–6.5)

## 2022-01-26 ENCOUNTER — Encounter: Payer: Self-pay | Admitting: Family Medicine

## 2022-01-26 LAB — TSH: TSH: 2.32 u[IU]/mL (ref 0.35–5.50)

## 2022-01-27 ENCOUNTER — Other Ambulatory Visit: Payer: Self-pay | Admitting: Psychiatry

## 2022-01-27 DIAGNOSIS — F411 Generalized anxiety disorder: Secondary | ICD-10-CM

## 2022-01-27 DIAGNOSIS — F5101 Primary insomnia: Secondary | ICD-10-CM

## 2022-01-27 NOTE — Telephone Encounter (Signed)
Alprazolam RF ?

## 2022-02-08 DIAGNOSIS — R2681 Unsteadiness on feet: Secondary | ICD-10-CM | POA: Diagnosis not present

## 2022-02-08 DIAGNOSIS — R531 Weakness: Secondary | ICD-10-CM | POA: Diagnosis not present

## 2022-02-11 DIAGNOSIS — R531 Weakness: Secondary | ICD-10-CM | POA: Diagnosis not present

## 2022-02-11 DIAGNOSIS — R2681 Unsteadiness on feet: Secondary | ICD-10-CM | POA: Diagnosis not present

## 2022-02-16 ENCOUNTER — Encounter (INDEPENDENT_AMBULATORY_CARE_PROVIDER_SITE_OTHER): Payer: PPO | Admitting: Ophthalmology

## 2022-02-17 DIAGNOSIS — R531 Weakness: Secondary | ICD-10-CM | POA: Diagnosis not present

## 2022-02-17 DIAGNOSIS — R2681 Unsteadiness on feet: Secondary | ICD-10-CM | POA: Diagnosis not present

## 2022-02-19 DIAGNOSIS — H9313 Tinnitus, bilateral: Secondary | ICD-10-CM | POA: Diagnosis not present

## 2022-02-19 DIAGNOSIS — R531 Weakness: Secondary | ICD-10-CM | POA: Diagnosis not present

## 2022-02-19 DIAGNOSIS — R2681 Unsteadiness on feet: Secondary | ICD-10-CM | POA: Diagnosis not present

## 2022-02-19 DIAGNOSIS — H903 Sensorineural hearing loss, bilateral: Secondary | ICD-10-CM | POA: Diagnosis not present

## 2022-02-22 DIAGNOSIS — R531 Weakness: Secondary | ICD-10-CM | POA: Diagnosis not present

## 2022-02-22 DIAGNOSIS — R2681 Unsteadiness on feet: Secondary | ICD-10-CM | POA: Diagnosis not present

## 2022-02-24 DIAGNOSIS — R531 Weakness: Secondary | ICD-10-CM | POA: Diagnosis not present

## 2022-02-24 DIAGNOSIS — R2681 Unsteadiness on feet: Secondary | ICD-10-CM | POA: Diagnosis not present

## 2022-03-03 ENCOUNTER — Telehealth: Payer: Self-pay | Admitting: Family Medicine

## 2022-03-03 DIAGNOSIS — R2681 Unsteadiness on feet: Secondary | ICD-10-CM | POA: Diagnosis not present

## 2022-03-03 DIAGNOSIS — R531 Weakness: Secondary | ICD-10-CM | POA: Diagnosis not present

## 2022-03-03 NOTE — Telephone Encounter (Signed)
Left message for patient to call back and schedule Medicare Annual Wellness Visit (AWV).  ? ?Please offer to do virtually or by telephone.  Left office number and my jabber [phone]. ? ?Last AWV:03/06/2015 ? ?Please schedule at anytime with Nurse Health Advisor. ?  ?

## 2022-03-04 ENCOUNTER — Ambulatory Visit (INDEPENDENT_AMBULATORY_CARE_PROVIDER_SITE_OTHER): Payer: PPO | Admitting: Ophthalmology

## 2022-03-04 ENCOUNTER — Encounter (INDEPENDENT_AMBULATORY_CARE_PROVIDER_SITE_OTHER): Payer: Self-pay | Admitting: Ophthalmology

## 2022-03-04 DIAGNOSIS — H353211 Exudative age-related macular degeneration, right eye, with active choroidal neovascularization: Secondary | ICD-10-CM | POA: Diagnosis not present

## 2022-03-04 DIAGNOSIS — H353221 Exudative age-related macular degeneration, left eye, with active choroidal neovascularization: Secondary | ICD-10-CM | POA: Diagnosis not present

## 2022-03-04 MED ORDER — AFLIBERCEPT 2MG/0.05ML IZ SOLN FOR KALEIDOSCOPE
2.0000 mg | INTRAVITREAL | Status: AC | PRN
  Administered 2022-03-04: 2 mg via INTRAVITREAL

## 2022-03-04 NOTE — Assessment & Plan Note (Signed)
The nature of wet macular degeneration was discussed with the patient.  Forms of therapy reviewed include the use of Anti-VEGF medications injected painlessly into the eye, as well as other possible treatment modalities, including thermal laser therapy. Fellow eye involvement and risks were discussed with the patient. Upon the finding of wet age related macular degeneration, treatment will be offered. The treatment regimen is on a treat as needed basis with the intent to treat if necessary and extend interval of exams when possible. On average 1 out of 6 patients do not need lifetime therapy. However, the risk of recurrent disease is high for a lifetime.  Initially monthly, then periodic, examinations and evaluations will determine whether the next treatment is required on the day of the examination. ? ?OS today at 6-week follow-up interval with less subretinal fluid on intravitreal Eylea.  We will repeat injection today and maintain 6-week interval until such fluid resolves ?

## 2022-03-04 NOTE — Assessment & Plan Note (Signed)
OD, with subfoveal PED.  Improved overall as compared to onset of disease August 2021 ?

## 2022-03-04 NOTE — Progress Notes (Signed)
? ? ?03/04/2022 ? ?  ? ?CHIEF COMPLAINT ?Patient presents for  ?Chief Complaint  ?Patient presents with  ? Macular Degeneration  ? ? ? ? ?HISTORY OF PRESENT ILLNESS: ?Angie Liu is a 76 y.o. female who presents to the clinic today for:  ? ?HPI   ?7 weeks dilate OS, Eylea OS, OCT. ?Patient states vision is stable and unchanged since last visit. Denies any new floaters or FOL. ?"I notice an occasional color. I was looking at my bath mat and I saw some orange color. When things are straight, like the door frame, it has a lot of waves to it. It isn't new, I just am noticing them now." ?Patient takes Preservision AREDS twice daily. ?Last edited by Laurin Coder on 03/04/2022  1:35 PM.  ?  ? ? ?Referring physician: ?Copland, Gay Filler, MD ?[address] ?[address] ?[place],  [place] [address] ? ?HISTORICAL INFORMATION:  ? ?Selected notes from the [facility] ?  ? ?Lab Results  ?Component Value Date  ? HGBA1C 5.8 01/21/2022  ?  ? ?CURRENT MEDICATIONS: ?No current outpatient medications on file. (Ophthalmic Drugs)  ? ?No current facility-administered medications for this visit. (Ophthalmic Drugs)  ? ?Current Outpatient Medications (Other)  ?Medication Sig  ? ALPRAZolam (XANAX) 0.5 MG tablet TAKE 1 TABLET BY MOUTH AT BEDTIME AND 1 TABLET BY MOUTH EVERY DAY AS NEEDED FOR ANXIETY  ? b complex vitamins capsule Take 1 capsule by mouth daily.  ? doxepin (SINEQUAN) 100 MG capsule Take 1 capsule (100 mg total) by mouth at bedtime.  ? Multiple Vitamin (MULTIVITAMIN) capsule Take 1 capsule by mouth daily.  ? Multiple Vitamins-Minerals (PRESERVISION AREDS 2 PO) Take by mouth.  ? OVER THE COUNTER MEDICATION 4 capsules daily. Calm CP, takes 1 breakfast, 1 at dinner and 2 at bedtime  ? OVER THE COUNTER MEDICATION Alpha gabba  ? Prenatal Vit-Fe Fumarate-FA (PRENATAL VITAMINS PO) Take by mouth.  ? progesterone (PROMETRIUM) 100 MG capsule Take 1 capsule (100 mg total) by mouth daily.  ? QUERCETIN PO Take by mouth.  ? traMADol  (ULTRAM) 50 MG tablet Take 50 mg by mouth every 6 (six) hours as needed.  ? venlafaxine XR (EFFEXOR XR) 37.5 MG 24 hr capsule Take 1 capsule (37.5 mg total) by mouth 2 (two) times daily. (Patient taking differently: Take 37.5 mg by mouth daily with breakfast.)  ? zolpidem (AMBIEN CR) 12.5 MG CR tablet Take 1 tablet (12.5 mg total) by mouth at bedtime as needed for sleep.  ? ?No current facility-administered medications for this visit. (Other)  ? ? ? ? ?REVIEW OF SYSTEMS: ?ROS   ?Negative for: Constitutional, Gastrointestinal, Skin, Genitourinary, Musculoskeletal, HENT, Endocrine, Cardiovascular, Eyes, Respiratory, Psychiatric, Allergic/Imm, Heme/Lymph ?Last edited by Hurman Horn, MD on 03/04/2022  2:31 PM.  ?  ? ? ? ?ALLERGIES ?Allergies  ?Allergen Reactions  ? Dust Mite Extract   ? Latex Rash  ? ? ?PAST MEDICAL HISTORY ?Past Medical History:  ?Diagnosis Date  ? Anxiety   ? Depression   ? Hayfever   ? ?Past Surgical History:  ?Procedure Laterality Date  ? CATARACT EXTRACTION W/PHACO Bilateral 2018  ? TONSILLECTOMY    ? ? ?FAMILY HISTORY ?Family History  ?Problem Relation Age of Onset  ? Cancer Mother   ?     colon  ? Depression Mother   ? Diabetes Father   ? Depression Father   ? Depression Son   ? Depression Paternal Uncle   ? ? ?  SOCIAL HISTORY ?Social History  ? ?Tobacco Use  ? Smoking status: Never  ? Smokeless tobacco: Never  ?Substance Use Topics  ? Alcohol use: Yes  ?  Alcohol/week: 2.0 standard drinks  ?  Types: 2 Glasses of wine per week  ?  Comment: 2 glass a day  ? Drug use: No  ? ?  ? ?  ? ?OPHTHALMIC EXAM: ? ?Base Eye Exam   ? ? Visual Acuity (ETDRS)   ? ?   Right Left  ? Dist cc 20/30 20/25 -2  ? ? Correction: Glasses  ? ?  ?  ? ? Tonometry (Tonopen, 1:38 PM)   ? ?   Right Left  ? Pressure 8 7  ? ?  ?  ? ? Pupils   ? ?   Pupils Dark Light APD  ? Right PERRL 4 3 None  ? Left PERRL 4 3 None  ? ?  ?  ? ? Extraocular Movement   ? ?   Right Left  ?  Full Full  ? ?  ?  ? ? Neuro/Psych   ? ? Oriented x3: Yes   ? Mood/Affect: Normal  ? ?  ?  ? ? Dilation   ? ? Left eye: 1.0% Mydriacyl, 2.5% Phenylephrine @ 1:38 PM  ? ?  ?  ? ?  ? ?Slit Lamp and Fundus Exam   ? ? External Exam   ? ?   Right Left  ? External Normal Normal  ? ?  ?  ? ? Slit Lamp Exam   ? ?   Right Left  ? Lids/Lashes Normal Normal  ? Conjunctiva/Sclera White and quiet White and quiet  ? Cornea Clear Clear  ? Anterior Chamber Deep and quiet Deep and quiet  ? Iris Round and reactive Round and reactive  ? Lens Posterior chamber intraocular lens Posterior chamber intraocular lens  ? Anterior Vitreous Normal Normal  ? ?  ?  ? ? Fundus Exam   ? ?   Right Left  ? Posterior Vitreous  Posterior vitreous detachment  ? Disc  Normal  ? C/D Ratio  0.6  ? Macula  Atrophy, Retinal pigment epithelial detachment, Drusen, no exudates, no hemorrhage, no macular thickening, Soft drusen, Intermediate age related macular degeneration, Pigmented atrophy  ? Vessels  Normal  ? Periphery  Normal  ? ?  ?  ? ?  ? ? ?IMAGING AND PROCEDURES  ?Imaging and Procedures for 03/04/22 ? ?Intravitreal Injection, Pharmacologic Agent - OS - Left Eye   ? ?   ?Time Out ?03/04/2022. 2:37 PM. Confirmed correct patient, procedure, site, and patient consented.  ? ?Anesthesia ?Topical anesthesia was used. Anesthetic medications included Lidocaine 4%.  ? ?Procedure ?Preparation included 10% betadine to eyelids, 5% betadine to ocular surface, Tobramycin 0.3%. A 30 gauge needle was used.  ? ?Injection: ?2 mg aflibercept 2 MG/0.05ML ?  Route: Intravitreal, Site: Left Eye ?  [place]: [id number], Lot: [id number], Waste: 0 mL  ? ?Post-op ?Post injection exam found visual acuity of at least counting fingers. The patient tolerated the procedure well. There were no complications. The patient received written and verbal post procedure care education. Post injection medications included ocuflox.  ? ?  ? ?OCT, Retina - OU - Both Eyes   ? ?   ?Right Eye ?Quality was good. Scan locations included subfoveal. Central Foveal  Thickness: 269. Progression has been stable. Findings include pigment epithelial detachment, retinal drusen , no SRF, no IRF.  ? ?Left  Eye ?Quality was good. Scan locations included subfoveal. Central Foveal Thickness: 247. Progression has improved. Findings include no IRF, subretinal fluid.  ? ?Notes ?OS, now 6 weeks weeks post therapy OS , with slight decrease in fluid subfoveal, will need to repeat evaluation formally OS in 6 weeks after repeat injection Eylea OS today for increasing fluid ? ? ?OD at 7 week post injection Eylea OD, and follow-up next as scheduled OD ? ?  ? ? ?  ?  ? ?  ?ASSESSMENT/PLAN: ? ?Exudative age-related macular degeneration of left eye with active choroidal neovascularization ([place]) ?The nature of wet macular degeneration was discussed with the patient.  Forms of therapy reviewed include the use of Anti-VEGF medications injected painlessly into the eye, as well as other possible treatment modalities, including thermal laser therapy. Fellow eye involvement and risks were discussed with the patient. Upon the finding of wet age related macular degeneration, treatment will be offered. The treatment regimen is on a treat as needed basis with the intent to treat if necessary and extend interval of exams when possible. On average 1 out of 6 patients do not need lifetime therapy. However, the risk of recurrent disease is high for a lifetime.  Initially monthly, then periodic, examinations and evaluations will determine whether the next treatment is required on the day of the examination. ? ?OS today at 6-week follow-up interval with less subretinal fluid on intravitreal Eylea.  We will repeat injection today and maintain 6-week interval until such fluid resolves ? ?Exudative age-related macular degeneration of right eye with active choroidal neovascularization ([place]) ?OD, with subfoveal PED.  Improved overall as compared to onset of disease August 2021  ? ?  ICD-10-CM   ?1. Exudative age-related  macular degeneration of left eye with active choroidal neovascularization ([facility])  H35.3221 Intravitreal Injection, Pharmacologic Agent - OS - Left Eye  ?  OCT, Retina - OU - Both Eyes  ?  aflibercept (EYLEA)

## 2022-03-05 ENCOUNTER — Ambulatory Visit (INDEPENDENT_AMBULATORY_CARE_PROVIDER_SITE_OTHER): Payer: PPO

## 2022-03-05 VITALS — Ht 66.0 in | Wt 173.0 lb

## 2022-03-05 DIAGNOSIS — R2681 Unsteadiness on feet: Secondary | ICD-10-CM | POA: Diagnosis not present

## 2022-03-05 DIAGNOSIS — Z0000 Encounter for general adult medical examination without abnormal findings: Secondary | ICD-10-CM

## 2022-03-05 DIAGNOSIS — R531 Weakness: Secondary | ICD-10-CM | POA: Diagnosis not present

## 2022-03-05 NOTE — Progress Notes (Signed)
? ?Subjective:  ? Angie Liu is a 76 y.o. female who presents for Medicare Annual (Subsequent) preventive examination. ?Virtual Visit via Telephone Note ? ?I connected with  Angie Liu on 03/05/22 at  8:15 AM EDT by telephone and verified that I am speaking with the correct person using two identifiers. ? ?Location: ?Patient: HOME ?Provider: [facility] ?Persons participating in the virtual visit: patient/Nurse Health Advisor ?  ?I discussed the limitations, risks, security and privacy concerns of performing an evaluation and management service by telephone and the availability of in person appointments. The patient expressed understanding and agreed to proceed. ? ?Interactive audio and video telecommunications were attempted between this nurse and patient, however failed, due to patient having technical difficulties OR patient did not have access to video capability.  We continued and completed visit with audio only. ? ?Some vital signs may be absent or patient reported.  ? ?Chriss Driver, LPN ? ?Review of Systems    ? ?Cardiac Risk Factors include: advanced age (>54mn, >>30women);sedentary lifestyle;Other (see comment), Risk factor comments: MACULAR DEGENERATION ? ?   ?Objective:  ?  ?Today's Vitals  ? 03/05/22 0815  ?Weight: 173 lb (78.5 kg)  ?Height: '5\' 6"'$  (1.676 m)  ? ?Body mass index is 27.92 kg/m?. ? ? ?  03/05/2022  ?  8:33 AM  ?Advanced Directives  ?Does Patient Have a Medical Advance Directive? Yes  ?Type of AParamedicof AFidelityLiving will  ?Copy of HPagein Chart? No - copy requested  ?Would patient like information on creating a medical advance directive? No - Patient declined  ? ? ?Current Medications (verified) ?Outpatient Encounter Medications as of 03/05/2022  ?Medication Sig  ? ALPRAZolam (XANAX) 1 MG tablet Take by mouth.  ? b complex vitamins capsule Take 1 capsule by mouth daily.  ? cholecalciferol (VITAMIN D) 25 MCG (1000 UNIT) tablet  SMARTSIG:1 By Mouth  ? doxepin (SINEQUAN) 100 MG capsule Take 1 capsule (100 mg total) by mouth at bedtime.  ? gabapentin (NEURONTIN) 300 MG capsule Take 300 mg by mouth 2 (two) times daily.  ? Multiple Vitamin (MULTIVITAMIN) capsule Take 1 capsule by mouth daily.  ? Multiple Vitamins-Minerals (PRESERVISION AREDS 2 PO) Take by mouth.  ? mupirocin ointment (BACTROBAN) 2 % Apply 1 application. topically daily.  ? OVER THE COUNTER MEDICATION 4 capsules daily. Calm CP, takes 1 breakfast, 1 at dinner and 2 at bedtime  ? OVER THE COUNTER MEDICATION Alpha gabba  ? Prenatal Vit-Fe Fumarate-FA (PRENATAL VITAMINS PO) Take by mouth.  ? progesterone (PROMETRIUM) 100 MG capsule Take 1 capsule (100 mg total) by mouth daily.  ? QUERCETIN PO Take by mouth.  ? SHINGRIX injection   ? traMADol (ULTRAM) 50 MG tablet Take 50 mg by mouth every 6 (six) hours as needed.  ? triamcinolone cream (KENALOG) 0.1 % Apply 1 application. topically 4 (four) times daily.  ? valACYclovir (VALTREX) 1000 MG tablet Take 1,000 mg by mouth 3 (three) times daily.  ? venlafaxine XR (EFFEXOR XR) 37.5 MG 24 hr capsule Take 1 capsule (37.5 mg total) by mouth 2 (two) times daily. (Patient taking differently: Take 37.5 mg by mouth daily with breakfast.)  ? zolpidem (AMBIEN CR) 12.5 MG CR tablet Take 1 tablet (12.5 mg total) by mouth at bedtime as needed for sleep.  ? [DISCONTINUED] ALPRAZolam (XANAX) 0.5 MG tablet TAKE 1 TABLET BY MOUTH AT BEDTIME AND 1 TABLET BY MOUTH EVERY DAY AS NEEDED FOR ANXIETY  ? ?  No facility-administered encounter medications on file as of 03/05/2022.  ? ? ?Allergies (verified) ?Dust mite extract and Latex  ? ?History: ?Past Medical History:  ?Diagnosis Date  ? Anxiety   ? Depression   ? Hayfever   ? ?Past Surgical History:  ?Procedure Laterality Date  ? CATARACT EXTRACTION W/PHACO Bilateral 2018  ? TONSILLECTOMY    ? ?Family History  ?Problem Relation Age of Onset  ? Cancer Mother   ?     colon  ? Depression Mother   ? Diabetes Father   ?  Depression Father   ? Depression Son   ? Depression Paternal Uncle   ? ?Social History  ? ?Socioeconomic History  ? Marital status: Married  ?  Spouse name: [name]  ? Number of children: 2  ? Years of education: Not on file  ? Highest education level: Master's degree (e.g., MA, MS, MEng, MEd, MSW, MBA)  ?Occupational History  ? Not on file  ?Tobacco Use  ? Smoking status: Never  ? Smokeless tobacco: Never  ?Substance and Sexual Activity  ? Alcohol use: Yes  ?  Alcohol/week: 2.0 standard drinks  ?  Types: 2 Glasses of wine per week  ?  Comment: 2 glass a day  ? Drug use: No  ? Sexual activity: Not on file  ?Other Topics Concern  ? Not on file  ?Social History Narrative  ? Live with husband  ? Right handed  ? Caffeine: 4-5 cups of coffee  ? ?Social Determinants of Health  ? ?Financial Resource Strain: Low Risk   ? Difficulty of Paying Living Expenses: Not hard at all  ?Food Insecurity: No Food Insecurity  ? Worried About Charity fundraiser in the Last Year: Never true  ? Ran Out of Food in the Last Year: Never true  ?Transportation Needs: No Transportation Needs  ? Lack of Transportation (Medical): No  ? Lack of Transportation (Non-Medical): No  ?Physical Activity: Insufficiently Active  ? Days of Exercise per Week: 5 days  ? Minutes of Exercise per Session: 20 min  ?Stress: No Stress Concern Present  ? Feeling of Stress : Not at all  ?Social Connections: Socially Integrated  ? Frequency of Communication with Friends and Family: Three times a week  ? Frequency of Social Gatherings with Friends and Family: Three times a week  ? Attends Religious Services: More than 4 times per year  ? Active Member of Clubs or Organizations: Yes  ? Attends Archivist Meetings: More than 4 times per year  ? Marital Status: Married  ? ? ?Tobacco Counseling ?Counseling given: Not Answered ? ? ?Clinical Intake: ? ?Pre-visit preparation completed: Yes ? ?Pain : No/denies pain ? ?  ? ?BMI - recorded: 27.92 ?Nutritional Status: BMI  25 -29 Overweight ?Nutritional Risks: None ?Diabetes: No ? ?How often do you need to have someone help you when you read instructions, pamphlets, or other written materials from your doctor or pharmacy?: 1 - Never ? ?Diabetic?NO ? ?Interpreter Needed?: No ? ?Information entered by :: mj Chelsea Pedretti, lpn ? ? ?Activities of Daily Living ? ?  03/05/2022  ?  8:41 AM  ?In your present state of health, do you have any difficulty performing the following activities:  ?Hearing? 1  ?Vision? 0  ?Comment Macular degeneration.  ?Difficulty concentrating or making decisions? 0  ?Walking or climbing stairs? 0  ?Dressing or bathing? 0  ?Doing errands, shopping? 0  ?Preparing Food and eating ? N  ?Using the Toilet? N  ?  In the past six months, have you accidently leaked urine? Y  ?Comment At times.  ?Do you have problems with loss of bowel control? N  ?Managing your Medications? N  ?Managing your Finances? N  ?Housekeeping or managing your Housekeeping? N  ? ? ?Patient Care Team: ?Copland, Gay Filler, MD as PCP - General (Family Medicine) ?Marylynn Pearson, MD as Attending Physician (Obstetrics and Gynecology) ?Montine Circle, MD as Referring Physician (Psychiatry) ?Jodi Marble, MD as Attending Physician (Otolaryngology) ? ?Indicate any recent Medical Services you may have received from other than [facility] providers in the past year (date may be approximate). ? ?   ?Assessment:  ? This is a routine wellness examination for Angie Liu. ? ?Hearing/Vision screen ?Hearing Screening - Comments:: Hearing aids. Recent hearing test.  ?Vision Screening - Comments:: Glasses. 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 03/04/2022. ? ?Dietary issues and exercise activities discussed: ?Current Exercise Habits: Home exercise routine, Type of exercise: walking;Other - see comments (PT exercises.), Time (Minutes): 30, Frequency (Times/Week): 5, Weekly Exercise (Minutes/Week): 150, Intensity: Mild, Exercise limited by: Other - see comments (macular  degeneration.) ? ? Goals Addressed   ? ?  ?  ?  ?  ? This Visit's Progress  ?  Exercise 3x per week (30 min per time)     ?  Continue to exercise, eat healthy and stay well.  ?  ? ?  ?Depression Screen ? ?  03/05/2022  ?  8

## 2022-03-05 NOTE — Patient Instructions (Signed)
Angie Liu , ?Thank you for taking time to come for your Medicare Wellness Visit. I appreciate your ongoing commitment to your health goals. Please review the following plan we discussed and let me know if I can assist you in the future.  ? ?Screening recommendations/referrals: ?Colonoscopy: No longer required. ?Mammogram: Ordered 07/01/2021. ?Bone Density: Ordered 01/21/2022. ?Recommended yearly ophthalmology/optometry visit for glaucoma screening and checkup ?Recommended yearly dental visit for hygiene and checkup ? ?Vaccinations: ?Influenza vaccine: Done 07/23/2021 Repeat annually ? ?Pneumococcal vaccine: Done 01/21/2022  ?Tdap vaccine: Done 04/08/2013 Repeat in 10 years ? ?Shingles vaccine: Done 12/18/2008, 07/29/21, 09/14/2021   ?Covid-19:Done 08/14/2021, 02/09/2021, 07/17/2020 and 06/26/2020 ? ?Advanced directives: Please bring a copy of your health care power of attorney and living will to the office to be added to your chart at your convenience. ? ? ?Conditions/risks identified: KEEP UP THE GOOD WORK!! ? ?Next appointment: Follow up in one year for your annual wellness visit 2024. ? ? ?Preventive Care 14 Years and Older, Female ?Preventive care refers to lifestyle choices and visits with your health care provider that can promote health and wellness. ?What does preventive care include? ?A yearly physical exam. This is also called an annual well check. ?Dental exams once or twice a year. ?Routine eye exams. Ask your health care provider how often you should have your eyes checked. ?Personal lifestyle choices, including: ?Daily care of your teeth and gums. ?Regular physical activity. ?Eating a healthy diet. ?Avoiding tobacco and drug use. ?Limiting alcohol use. ?Practicing safe sex. ?Taking low-dose aspirin every day. ?Taking vitamin and mineral supplements as recommended by your health care provider. ?What happens during an annual well check? ?The services and screenings done by your health care provider during your  annual well check will depend on your age, overall health, lifestyle risk factors, and family history of disease. ?Counseling  ?Your health care provider may ask you questions about your: ?Alcohol use. ?Tobacco use. ?Drug use. ?Emotional well-being. ?Home and relationship well-being. ?Sexual activity. ?Eating habits. ?History of falls. ?Memory and ability to understand (cognition). ?Work and work Statistician. ?Reproductive health. ?Screening  ?You may have the following tests or measurements: ?Height, weight, and BMI. ?Blood pressure. ?Lipid and cholesterol levels. These may be checked every 5 years, or more frequently if you are over 45 years old. ?Skin check. ?Lung cancer screening. You may have this screening every year starting at age 38 if you have a 30-pack-year history of smoking and currently smoke or have quit within the past 15 years. ?Fecal occult blood test (FOBT) of the stool. You may have this test every year starting at age 8. ?Flexible sigmoidoscopy or colonoscopy. You may have a sigmoidoscopy every 5 years or a colonoscopy every 10 years starting at age 4. ?Hepatitis C blood test. ?Hepatitis B blood test. ?Sexually transmitted disease (STD) testing. ?Diabetes screening. This is done by checking your blood sugar (glucose) after you have not eaten for a while (fasting). You may have this done every 1-3 years. ?Bone density scan. This is done to screen for osteoporosis. You may have this done starting at age 8. ?Mammogram. This may be done every 1-2 years. Talk to your health care provider about how often you should have regular mammograms. ?Talk with your health care provider about your test results, treatment options, and if necessary, the need for more tests. ?Vaccines  ?Your health care provider may recommend certain vaccines, such as: ?Influenza vaccine. This is recommended every year. ?Tetanus, diphtheria, and acellular pertussis (Tdap, Td)  vaccine. You may need a Td booster every 10  years. ?Zoster vaccine. You may need this after age 78. ?Pneumococcal 13-valent conjugate (PCV13) vaccine. One dose is recommended after age 55. ?Pneumococcal polysaccharide (PPSV23) vaccine. One dose is recommended after age 24. ?Talk to your health care provider about which screenings and vaccines you need and how often you need them. ?This information is not intended to replace advice given to you by your health care provider. Make sure you discuss any questions you have with your health care provider. ?Document Released: 11/14/2015 Document Revised: 07/07/2016 Document Reviewed: 08/19/2015 ?Elsevier Interactive Patient Education ? 2017 [place]. ? ?Fall Prevention in the Home ?Falls can cause injuries. They can happen to people of all ages. There are many things you can do to make your home safe and to help prevent falls. ?What can I do on the outside of my home? ?Regularly fix the edges of walkways and driveways and fix any cracks. ?Remove anything that might make you trip as you walk through a door, such as a raised step or threshold. ?Trim any bushes or trees on the path to your home. ?Use bright outdoor lighting. ?Clear any walking paths of anything that might make someone trip, such as rocks or tools. ?Regularly check to see if handrails are loose or broken. Make sure that both sides of any steps have handrails. ?Any raised decks and porches should have guardrails on the edges. ?Have any leaves, snow, or ice cleared regularly. ?Use sand or salt on walking paths during winter. ?Clean up any spills in your garage right away. This includes oil or grease spills. ?What can I do in the bathroom? ?Use night lights. ?Install grab bars by the toilet and in the tub and shower. Do not use towel bars as grab bars. ?Use non-skid mats or decals in the tub or shower. ?If you need to sit down in the shower, use a plastic, non-slip stool. ?Keep the floor dry. Clean up any water that spills on the floor as soon as it  happens. ?Remove soap buildup in the tub or shower regularly. ?Attach bath mats securely with double-sided non-slip rug tape. ?Do not have throw rugs and other things on the floor that can make you trip. ?What can I do in the bedroom? ?Use night lights. ?Make sure that you have a light by your bed that is easy to reach. ?Do not use any sheets or blankets that are too big for your bed. They should not hang down onto the floor. ?Have a firm chair that has side arms. You can use this for support while you get dressed. ?Do not have throw rugs and other things on the floor that can make you trip. ?What can I do in the kitchen? ?Clean up any spills right away. ?Avoid walking on wet floors. ?Keep items that you use a lot in easy-to-reach places. ?If you need to reach something above you, use a strong step stool that has a grab bar. ?Keep electrical cords out of the way. ?Do not use floor polish or wax that makes floors slippery. If you must use wax, use non-skid floor wax. ?Do not have throw rugs and other things on the floor that can make you trip. ?What can I do with my stairs? ?Do not leave any items on the stairs. ?Make sure that there are handrails on both sides of the stairs and use them. Fix handrails that are broken or loose. Make sure that handrails are as long  as the stairways. ?Check any carpeting to make sure that it is firmly attached to the stairs. Fix any carpet that is loose or worn. ?Avoid having throw rugs at the top or bottom of the stairs. If you do have throw rugs, attach them to the floor with carpet tape. ?Make sure that you have a light switch at the top of the stairs and the bottom of the stairs. If you do not have them, ask someone to add them for you. ?What else can I do to help prevent falls? ?Wear shoes that: ?Do not have high heels. ?Have rubber bottoms. ?Are comfortable and fit you well. ?Are closed at the toe. Do not wear sandals. ?If you use a stepladder: ?Make sure that it is fully opened.  Do not climb a closed stepladder. ?Make sure that both sides of the stepladder are locked into place. ?Ask someone to hold it for you, if possible. ?Clearly mark and make sure that you can see: ?Any grab bars or han

## 2022-03-09 ENCOUNTER — Encounter (INDEPENDENT_AMBULATORY_CARE_PROVIDER_SITE_OTHER): Payer: PPO | Admitting: Ophthalmology

## 2022-03-15 DIAGNOSIS — R2681 Unsteadiness on feet: Secondary | ICD-10-CM | POA: Diagnosis not present

## 2022-03-15 DIAGNOSIS — R531 Weakness: Secondary | ICD-10-CM | POA: Diagnosis not present

## 2022-03-17 DIAGNOSIS — R531 Weakness: Secondary | ICD-10-CM | POA: Diagnosis not present

## 2022-03-17 DIAGNOSIS — R2681 Unsteadiness on feet: Secondary | ICD-10-CM | POA: Diagnosis not present

## 2022-03-22 ENCOUNTER — Encounter: Payer: Self-pay | Admitting: Psychiatry

## 2022-03-22 ENCOUNTER — Ambulatory Visit (INDEPENDENT_AMBULATORY_CARE_PROVIDER_SITE_OTHER): Payer: PPO | Admitting: Psychiatry

## 2022-03-22 DIAGNOSIS — F5101 Primary insomnia: Secondary | ICD-10-CM | POA: Diagnosis not present

## 2022-03-22 DIAGNOSIS — F331 Major depressive disorder, recurrent, moderate: Secondary | ICD-10-CM

## 2022-03-22 DIAGNOSIS — F411 Generalized anxiety disorder: Secondary | ICD-10-CM

## 2022-03-22 DIAGNOSIS — R2681 Unsteadiness on feet: Secondary | ICD-10-CM | POA: Diagnosis not present

## 2022-03-22 DIAGNOSIS — R531 Weakness: Secondary | ICD-10-CM | POA: Diagnosis not present

## 2022-03-22 MED ORDER — DOXEPIN HCL 100 MG PO CAPS: 100.0000 mg | ORAL_CAPSULE | Freq: Every day | ORAL | 1 refills | Status: DC

## 2022-03-22 MED ORDER — ALPRAZOLAM 0.5 MG PO TABS: 0.5000 mg | ORAL_TABLET | Freq: Two times a day (BID) | ORAL | 0 refills | Status: DC | PRN

## 2022-03-22 MED ORDER — ZOLPIDEM TARTRATE ER 6.25 MG PO TBCR: 6.2500 mg | EXTENDED_RELEASE_TABLET | Freq: Every evening | ORAL | 0 refills | Status: DC | PRN

## 2022-03-22 MED ORDER — VENLAFAXINE HCL ER 37.5 MG PO CP24: 37.5000 mg | ORAL_CAPSULE | Freq: Every day | ORAL | 0 refills | Status: DC

## 2022-03-22 NOTE — Progress Notes (Signed)
Angie Liu [id number] 1945/12/15 76 y.o.  Virtual Visit via Telephone Note  I connected with pt on 03/22/22 at 12:00 PM EDT by telephone and verified that I am speaking with the correct person using two identifiers.   I discussed the limitations, risks, security and privacy concerns of performing an evaluation and management service by telephone and the availability of in person appointments. I also discussed with the patient that there may be a patient responsible charge related to this service. The patient expressed understanding and agreed to proceed.   I discussed the assessment and treatment plan with the patient. The patient was provided an opportunity to ask questions and all were answered. The patient agreed with the plan and demonstrated an understanding of the instructions.   The patient was advised to call back or seek an in-person evaluation if the symptoms worsen or if the condition fails to improve as anticipated.  I provided 30 minutes of non-face-to-face time during this encounter.  The patient was located in [place], [place] in her personal vehicle.  The provider was located at home.   Thayer Headings, PMHNP   Subjective:   Patient ID:  DHALIA Liu is a 76 y.o. (DOB 02-25-1946) female.  Chief Complaint:  Chief Complaint  Patient presents with   Follow-up    Depression, anxiety, and insomnia    HPI [name] presents for follow-up of depression, anxiety, and insomnia. She reports, "I'm doing extremely well." She reports that she is now gluten free and dairy free and this has been helpful. She has also been going to PT and this has been helpful for her strength and balance. "I reduced dramatically the amount of alcohol" and now only drinks one glass on special occasions. Her husband estimates that she is going a month without alcohol at times. She reports that she is not craving ETOH or felt the need to drink. She reports occasional anxiety "but not the way I used  to." She had some anxiety with preparing to host dinner guests "but once they arrived it was fun." Mood has been stable overall. She reports that she is sleeping "generally well." She has been typically sleeping through the night. Energy and motivation have "greatly improved." She reports that she has been losing weight. Appetite has been good. She reports that her concentration has improved significantly. Denies SI.   She reports that she has been taking Effexor XR as needed- "I don't feel like I really need it."  She reports that she takes Xanax 0.5 mg at bedtime. She will occasionally take an additional Xanax prn for middle of the night awakenings.   Husband is about to turn 40 years old and she is making arrangements to celebrate his birthday. Recent trip to [facility] and reports that she was content to sit on the beach with her husband.   She reports strong psychosocial support from family and friends. Has recently been socializing more.   She recalls moments of depression as a child.   Past Psychotropic Medication Trials: Lyrica Ambien Alprazolam  Doxepin- Has been ineffective at 25 mg. Noticed some slight improvement in sleep initiation and maintenance with increase to 50 mg po QHS. Started 25 mg on 09/26/17.  Hydroxyzine- Ineffective Xanax Ambien CR- Has been most helpful Ambien Sonata Rozerem- Ineffective Trileptal- Took for several years Remeron Trazodone Bupropion Lexapro- Helpful many years ago but did not respond more recently. Wellbutrin- Tremor Luvox- Took 50 mg po qd Prozac- took briefly and did not  respond well Zoloft- "remember I didn't like it." Cymbalta- May have caused tremors Has not taken Celexa, Buspar, Effexor Abilify, Lamictal, Seroquel, Olanzapine    Review of Systems:  Review of Systems  Musculoskeletal:  Negative for gait problem.  Neurological:  Positive for dizziness.  Psychiatric/Behavioral:         Please refer to HPI   Medications: I  have reviewed the patient's current medications.  Current Outpatient Medications  Medication Sig Dispense Refill   QUERCETIN PO Take by mouth.     zolpidem (AMBIEN CR) 12.5 MG CR tablet Take 1 tablet (12.5 mg total) by mouth at bedtime as needed for sleep. 90 tablet 1   zolpidem (AMBIEN CR) 6.25 MG CR tablet Take 1 tablet (6.25 mg total) by mouth at bedtime as needed for sleep. 30 tablet 0   [START ON 04/26/2022] ALPRAZolam (XANAX) 0.5 MG tablet Take 1 tablet (0.5 mg total) by mouth 2 (two) times daily as needed for anxiety. 180 tablet 0   b complex vitamins capsule Take 1 capsule by mouth daily.     cholecalciferol (VITAMIN D) 25 MCG (1000 UNIT) tablet SMARTSIG:1 By Mouth     doxepin (SINEQUAN) 100 MG capsule Take 1 capsule (100 mg total) by mouth at bedtime. 90 capsule 1   gabapentin (NEURONTIN) 300 MG capsule Take 300 mg by mouth 2 (two) times daily.     Multiple Vitamin (MULTIVITAMIN) capsule Take 1 capsule by mouth daily.     Multiple Vitamins-Minerals (PRESERVISION AREDS 2 PO) Take by mouth.     mupirocin ointment (BACTROBAN) 2 % Apply 1 application. topically daily.     OVER THE COUNTER MEDICATION 4 capsules daily. Calm CP, takes 1 breakfast, 1 at dinner and 2 at bedtime     OVER THE COUNTER MEDICATION Alpha gabba     Prenatal Vit-Fe Fumarate-FA (PRENATAL VITAMINS PO) Take by mouth.     progesterone (PROMETRIUM) 100 MG capsule Take 1 capsule (100 mg total) by mouth daily. 90 capsule 3   SHINGRIX injection      traMADol (ULTRAM) 50 MG tablet Take 50 mg by mouth every 6 (six) hours as needed.     triamcinolone cream (KENALOG) 0.1 % Apply 1 application. topically 4 (four) times daily.     valACYclovir (VALTREX) 1000 MG tablet Take 1,000 mg by mouth 3 (three) times daily.     venlafaxine XR (EFFEXOR XR) 37.5 MG 24 hr capsule Take 1 capsule (37.5 mg total) by mouth daily with breakfast. 90 capsule 0   No current facility-administered medications for this visit.    Medication Side  Effects: Other: dizziness  Allergies:  Allergies  Allergen Reactions   Dust Mite Extract    Latex Rash    Past Medical History:  Diagnosis Date   Anxiety    Depression    Hayfever     Family History  Problem Relation Age of Onset   Cancer Mother        colon   Depression Mother    Diabetes Father    Depression Father    Depression Son    Depression Paternal Uncle     Social History   Socioeconomic History   Marital status: Married    Spouse name: [name]   Number of children: 2   Years of education: Not on file   Highest education level: Master's degree (e.g., MA, MS, MEng, MEd, MSW, MBA)  Occupational History   Not on file  Tobacco Use   Smoking status: Never  Smokeless tobacco: Never  Substance and Sexual Activity   Alcohol use: Yes    Alcohol/week: 2.0 standard drinks    Types: 2 Glasses of wine per week    Comment: 2 glass a day   Drug use: No   Sexual activity: Not on file  Other Topics Concern   Not on file  Social History Narrative   Live with husband   Right handed   Caffeine: 4-5 cups of coffee   Social Determinants of Health   Financial Resource Strain: Low Risk    Difficulty of Paying Living Expenses: Not hard at all  Food Insecurity: No Food Insecurity   Worried About Charity fundraiser in the Last Year: Never true   Ran Out of Food in the Last Year: Never true  Transportation Needs: No Transportation Needs   Lack of Transportation (Medical): No   Lack of Transportation (Non-Medical): No  Physical Activity: Insufficiently Active   Days of Exercise per Week: 5 days   Minutes of Exercise per Session: 20 min  Stress: No Stress Concern Present   Feeling of Stress : Not at all  Social Connections: Socially Integrated   Frequency of Communication with Friends and Family: Three times a week   Frequency of Social Gatherings with Friends and Family: Three times a week   Attends Religious Services: More than 4 times per year   Active Member  of Clubs or Organizations: Yes   Attends Music therapist: More than 4 times per year   Marital Status: Married  Human resources officer Violence: Not At Risk   Fear of Current or Ex-Partner: No   Emotionally Abused: No   Physically Abused: No   Sexually Abused: No    Past Medical History, Surgical history, Social history, and Family history were reviewed and updated as appropriate.   Please see review of systems for further details on the patient's review from today.   Objective:   Physical Exam:  There were no vitals taken for this visit.  Physical Exam Neurological:     Mental Status: She is alert and oriented to person, place, and time.     Cranial Nerves: No dysarthria.  Psychiatric:        Attention and Perception: Attention and perception normal.        Mood and Affect: Mood normal.        Speech: Speech normal.        Behavior: Behavior is cooperative.        Thought Content: Thought content normal. Thought content is not paranoid or delusional. Thought content does not include homicidal or suicidal ideation. Thought content does not include homicidal or suicidal plan.        Cognition and Memory: Cognition and memory normal.        Judgment: Judgment normal.     Comments: Insight intact    Lab Review:     Component Value Date/Time   NA 138 01/21/2022 1338   NA 143 05/25/2021 1517   K 4.5 01/21/2022 1338   CL 105 01/21/2022 1338   CO2 27 01/21/2022 1338   GLUCOSE 108 (H) 01/21/2022 1338   BUN 19 01/21/2022 1338   BUN 16 05/25/2021 1517   CREATININE 0.73 01/21/2022 1338   CALCIUM 9.0 01/21/2022 1338   PROT 6.5 01/21/2022 1338   PROT 6.7 05/25/2021 1517   ALBUMIN 4.8 01/21/2022 1338   ALBUMIN 4.8 (H) 05/25/2021 1517   AST 22 01/21/2022 1338   ALT 21 01/21/2022  1338   ALKPHOS 98 01/21/2022 1338   BILITOT 0.5 01/21/2022 1338   BILITOT 0.2 05/25/2021 1517       Component Value Date/Time   WBC 5.6 01/21/2022 1338   RBC 4.06 01/21/2022 1338   HGB  13.8 01/21/2022 1338   HGB 13.6 05/25/2021 1517   HCT 41.3 01/21/2022 1338   HCT 39.5 05/25/2021 1517   PLT 244.0 01/21/2022 1338   PLT 244 05/25/2021 1517   MCV 101.6 (H) 01/21/2022 1338   MCV 99 (H) 05/25/2021 1517   MCH 34.1 (H) 05/25/2021 1517   MCHC 33.4 01/21/2022 1338   RDW 13.2 01/21/2022 1338   RDW 14.6 05/25/2021 1517   LYMPHSABS 0.9 05/25/2021 1517   EOSABS 0.1 05/25/2021 1517   BASOSABS 0.0 05/25/2021 1517    No results found for: POCLITH, LITHIUM   No results found for: PHENYTOIN, PHENOBARB, VALPROATE, CBMZ   .res Assessment: Plan:   She reports that she is interested in trying to reduce Ambien CR. Discussed that Ambien CR is available in a 6.25 mg and 12.5 mg dose. Discussed that provider would send script for #30 Ambien CR 6.25 mg for her to try. Discussed that she could still alternate between the 6.25 mg dose and  the remaining 12.5 mg dose if she was anxious about something the following day since she describes some concern about worsening insomnia with dose reduction. Discussed at first trying the lower dose when she feels she may be able to sleep without difficulty. Requested that she contact office in several weeks to report how she has done with the 6.25 mg dose and then a script could be sent in for either the 6.25 mg or 12.5 mg dose based on her response.  Recommended continuing to take Effexor XR 37.5 mg daily while trying to reduce Ambien CR since Effexor XR can help improve anxiety and worsening anxiety may disrupt sleep. Discussed that Effexor XR has a short half-life and that discontinuation symptoms can occur with missed doses and it typically needs to be taken daily to be effective.  Continue Doxepin 100 mg po QHS for depression and insomnia.  Continue Xanax 0.5 mg po BID prn anxiety or middle of the night awakenings.  Pt to follow-up in 3 months or sooner if clinically indicated.  Patient advised to contact office with any questions, adverse effects, or  acute worsening in signs and symptoms.    Kathalina was seen today for follow-up.  Diagnoses and all orders for this visit:  Generalized anxiety disorder -     ALPRAZolam (XANAX) 0.5 MG tablet; Take 1 tablet (0.5 mg total) by mouth 2 (two) times daily as needed for anxiety. -     venlafaxine XR (EFFEXOR XR) 37.5 MG 24 hr capsule; Take 1 capsule (37.5 mg total) by mouth daily with breakfast.  Moderate recurrent major depression (HCC) -     venlafaxine XR (EFFEXOR XR) 37.5 MG 24 hr capsule; Take 1 capsule (37.5 mg total) by mouth daily with breakfast. -     doxepin (SINEQUAN) 100 MG capsule; Take 1 capsule (100 mg total) by mouth at bedtime.  Primary insomnia -     zolpidem (AMBIEN CR) 6.25 MG CR tablet; Take 1 tablet (6.25 mg total) by mouth at bedtime as needed for sleep. -     doxepin (SINEQUAN) 100 MG capsule; Take 1 capsule (100 mg total) by mouth at bedtime.    Please see After Visit Summary for patient specific instructions.  Future Appointments  Date Time Provider [place]  04/13/2022  2:00 PM Rankin, Clent Demark, MD [facility] None  07/28/2022  9:40 AM Copland, Gay Filler, MD [facility]  03/08/2023  8:20 AM [facility] COACH [facility]    No orders of the defined types were placed in this encounter.     -------------------------------

## 2022-03-24 DIAGNOSIS — R2681 Unsteadiness on feet: Secondary | ICD-10-CM | POA: Diagnosis not present

## 2022-03-24 DIAGNOSIS — R531 Weakness: Secondary | ICD-10-CM | POA: Diagnosis not present

## 2022-03-29 DIAGNOSIS — R2681 Unsteadiness on feet: Secondary | ICD-10-CM | POA: Diagnosis not present

## 2022-03-29 DIAGNOSIS — R531 Weakness: Secondary | ICD-10-CM | POA: Diagnosis not present

## 2022-03-31 DIAGNOSIS — R531 Weakness: Secondary | ICD-10-CM | POA: Diagnosis not present

## 2022-03-31 DIAGNOSIS — R2681 Unsteadiness on feet: Secondary | ICD-10-CM | POA: Diagnosis not present

## 2022-04-05 DIAGNOSIS — R2681 Unsteadiness on feet: Secondary | ICD-10-CM | POA: Diagnosis not present

## 2022-04-05 DIAGNOSIS — R531 Weakness: Secondary | ICD-10-CM | POA: Diagnosis not present

## 2022-04-06 ENCOUNTER — Ambulatory Visit: Payer: PPO | Admitting: Psychiatry

## 2022-04-06 ENCOUNTER — Telehealth: Payer: PPO | Admitting: Psychiatry

## 2022-04-07 DIAGNOSIS — R2681 Unsteadiness on feet: Secondary | ICD-10-CM | POA: Diagnosis not present

## 2022-04-07 DIAGNOSIS — R531 Weakness: Secondary | ICD-10-CM | POA: Diagnosis not present

## 2022-04-12 DIAGNOSIS — R2681 Unsteadiness on feet: Secondary | ICD-10-CM | POA: Diagnosis not present

## 2022-04-12 DIAGNOSIS — R531 Weakness: Secondary | ICD-10-CM | POA: Diagnosis not present

## 2022-04-13 ENCOUNTER — Encounter (INDEPENDENT_AMBULATORY_CARE_PROVIDER_SITE_OTHER): Payer: Self-pay | Admitting: Ophthalmology

## 2022-04-13 ENCOUNTER — Ambulatory Visit (INDEPENDENT_AMBULATORY_CARE_PROVIDER_SITE_OTHER): Payer: PPO | Admitting: Ophthalmology

## 2022-04-13 DIAGNOSIS — H353221 Exudative age-related macular degeneration, left eye, with active choroidal neovascularization: Secondary | ICD-10-CM | POA: Diagnosis not present

## 2022-04-13 DIAGNOSIS — H353231 Exudative age-related macular degeneration, bilateral, with active choroidal neovascularization: Secondary | ICD-10-CM | POA: Diagnosis not present

## 2022-04-13 DIAGNOSIS — H353211 Exudative age-related macular degeneration, right eye, with active choroidal neovascularization: Secondary | ICD-10-CM

## 2022-04-13 MED ORDER — AFLIBERCEPT 2MG/0.05ML IZ SOLN FOR KALEIDOSCOPE
2.0000 mg | INTRAVITREAL | Status: AC | PRN
  Administered 2022-04-13: 2 mg via INTRAVITREAL

## 2022-04-13 NOTE — Assessment & Plan Note (Signed)
OS, with vascularized PED and subretinal fluid yet still improved at 6-week postinjection repeat injection OS today Eylea

## 2022-04-13 NOTE — Assessment & Plan Note (Signed)
OD, subfoveal PED, no intraretinal fluid no subretinal fluid, maintained acuity at 6 weeks post injection Eylea.  Repeat injection Eylea OD today

## 2022-04-13 NOTE — Progress Notes (Signed)
04/13/2022     CHIEF COMPLAINT Patient presents for  Chief Complaint  Patient presents with   Macular Degeneration      HISTORY OF PRESENT ILLNESS: Angie Liu is a 76 y.o. female who presents to the clinic today for:   HPI   3 mos OD, Eylea OCT and 6 weeks Eylea OS OCT. Patient reports vision is stable, denies new FOL or floaters. "My vision is bad. I was meeting with my husband to meet about doing work on the house but I couldn't see well enough to be able to see what my husband and the person were talking about. I couldn't read the paperwork." Patient takes PreserVision AREDS twice daily. Last edited by Laurin Coder on 04/13/2022  2:08 PM.      Referring physician: Darreld Mclean, MD [address] [place],  [place] [address]  HISTORICAL INFORMATION:   Selected notes from the MEDICAL RECORD NUMBER    Lab Results  Component Value Date   HGBA1C 5.8 01/21/2022     CURRENT MEDICATIONS: No current outpatient medications on file. (Ophthalmic Drugs)   No current facility-administered medications for this visit. (Ophthalmic Drugs)   Current Outpatient Medications (Other)  Medication Sig   [START ON 04/26/2022] ALPRAZolam (XANAX) 0.5 MG tablet Take 1 tablet (0.5 mg total) by mouth 2 (two) times daily as needed for anxiety.   b complex vitamins capsule Take 1 capsule by mouth daily.   cholecalciferol (VITAMIN D) 25 MCG (1000 UNIT) tablet SMARTSIG:1 By Mouth   doxepin (SINEQUAN) 100 MG capsule Take 1 capsule (100 mg total) by mouth at bedtime.   gabapentin (NEURONTIN) 300 MG capsule Take 300 mg by mouth 2 (two) times daily.   Multiple Vitamin (MULTIVITAMIN) capsule Take 1 capsule by mouth daily.   Multiple Vitamins-Minerals (PRESERVISION AREDS 2 PO) Take by mouth.   mupirocin ointment (BACTROBAN) 2 % Apply 1 application. topically daily.   OVER THE COUNTER MEDICATION 4 capsules daily. Calm CP, takes 1 breakfast, 1 at dinner and 2 at bedtime   OVER  THE COUNTER MEDICATION Alpha gabba   Prenatal Vit-Fe Fumarate-FA (PRENATAL VITAMINS PO) Take by mouth.   progesterone (PROMETRIUM) 100 MG capsule Take 1 capsule (100 mg total) by mouth daily.   QUERCETIN PO Take by mouth.   SHINGRIX injection    traMADol (ULTRAM) 50 MG tablet Take 50 mg by mouth every 6 (six) hours as needed.   triamcinolone cream (KENALOG) 0.1 % Apply 1 application. topically 4 (four) times daily.   valACYclovir (VALTREX) 1000 MG tablet Take 1,000 mg by mouth 3 (three) times daily.   venlafaxine XR (EFFEXOR XR) 37.5 MG 24 hr capsule Take 1 capsule (37.5 mg total) by mouth daily with breakfast.   zolpidem (AMBIEN CR) 12.5 MG CR tablet Take 1 tablet (12.5 mg total) by mouth at bedtime as needed for sleep.   zolpidem (AMBIEN CR) 6.25 MG CR tablet Take 1 tablet (6.25 mg total) by mouth at bedtime as needed for sleep.   No current facility-administered medications for this visit. (Other)      REVIEW OF SYSTEMS: ROS   Negative for: Constitutional, Gastrointestinal, Neurological, Skin, Genitourinary, Musculoskeletal, HENT, Endocrine, Cardiovascular, Eyes, Respiratory, Psychiatric, Allergic/Imm, Heme/Lymph Last edited by Hurman Horn, MD on 04/13/2022  2:23 PM.       ALLERGIES Allergies  Allergen Reactions   Dust Mite Extract    Latex Rash    PAST MEDICAL HISTORY Past Medical History:  Diagnosis Date   Anxiety    Depression    Hayfever    Past Surgical History:  Procedure Laterality Date   CATARACT EXTRACTION W/PHACO Bilateral 2018   TONSILLECTOMY      FAMILY HISTORY Family History  Problem Relation Age of Onset   Cancer Mother        colon   Depression Mother    Diabetes Father    Depression Father    Depression Son    Depression Paternal Uncle     SOCIAL HISTORY Social History   Tobacco Use   Smoking status: Never   Smokeless tobacco: Never  Substance Use Topics   Alcohol use: Yes    Alcohol/week: 2.0 standard drinks of alcohol    Types:  2 Glasses of wine per week    Comment: 2 glass a day   Drug use: No         OPHTHALMIC EXAM:  Base Eye Exam     Visual Acuity (ETDRS)       Right Left   Dist cc 20/30 20/25   Dist ph cc NI     Correction: Glasses         Tonometry (Tonopen, 2:12 PM)       Right Left   Pressure 17 15         Pupils       Pupils Dark Light APD   Right PERRL 4 3 None   Left PERRL 4 3 None         Extraocular Movement       Right Left    Full Full         Neuro/Psych     Oriented x3: Yes   Mood/Affect: Normal         Dilation     Both eyes: 1.0% Mydriacyl, 2.5% Phenylephrine @ 2:12 PM           Slit Lamp and Fundus Exam     External Exam       Right Left   External Normal Normal         Slit Lamp Exam       Right Left   Lids/Lashes Normal Normal   Conjunctiva/Sclera White and quiet White and quiet   Cornea Clear Clear   Anterior Chamber Deep and quiet Deep and quiet   Iris Round and reactive Round and reactive   Lens Posterior chamber intraocular lens Posterior chamber intraocular lens   Anterior Vitreous Normal Normal         Fundus Exam       Right Left   Posterior Vitreous Posterior vitreous detachment Posterior vitreous detachment   Disc Normal Normal   C/D Ratio 0.55 0.6   Macula no hemorrhage, no exudates, Retinal pigment epithelial mottling, no macular thickening, Retinal pigment epithelial detachment, Soft drusen Atrophy, Retinal pigment epithelial detachment, Drusen, no exudates, no hemorrhage, no macular thickening, Soft drusen, Intermediate age related macular degeneration, Pigmented atrophy   Vessels Normal Normal   Periphery Normal Normal            IMAGING AND PROCEDURES  Imaging and Procedures for 04/13/22  Intravitreal Injection, Pharmacologic Agent - OD - Right Eye       Time Out 04/13/2022. 2:26 PM. Confirmed correct patient, procedure, site, and patient consented.   Anesthesia Topical anesthesia was used.  Anesthetic medications included Lidocaine 4%.   Procedure Preparation included 5% betadine to ocular surface, 10% betadine to eyelids, Tobramycin 0.3%, Ofloxacin . A 30  gauge needle was used.   Injection: 2 mg aflibercept 2 MG/0.05ML   Route: Intravitreal, Site: Right Eye   NDC: A3590391, Lot: [id number], Waste: 0 mL   Post-op Post injection exam found visual acuity of at least counting fingers. The patient tolerated the procedure well. There were no complications. The patient received written and verbal post procedure care education. Post injection medications included ocuflox.      Intravitreal Injection, Pharmacologic Agent - OS - Left Eye       Time Out 04/13/2022. 2:25 PM. Confirmed correct patient, procedure, site, and patient consented.   Anesthesia Topical anesthesia was used. Anesthetic medications included Lidocaine 4%.   Procedure Preparation included 5% betadine to ocular surface, 10% betadine to eyelids, Tobramycin 0.3%. A 30 gauge needle was used.   Injection: 2 mg aflibercept 2 MG/0.05ML   Route: Intravitreal, Site: Left Eye   NDC: A3590391, Lot: [id number], Waste: 0 mL   Post-op Post injection exam found visual acuity of at least counting fingers. The patient tolerated the procedure well. There were no complications. The patient received written and verbal post procedure care education. Post injection medications included ocuflox.      OCT, Retina - OU - Both Eyes       Right Eye Quality was good. Scan locations included subfoveal. Central Foveal Thickness: 272. Progression has been stable. Findings include no IRF, no SRF, retinal drusen , pigment epithelial detachment.   Left Eye Quality was good. Scan locations included subfoveal. Central Foveal Thickness: 250. Progression has improved. Findings include no IRF, subretinal fluid.   Notes OS, now 6 weeks weeks post therapy OS , with slight decrease in fluid subfoveal, will need to repeat  evaluation formally OS in 6 weeks after repeat injection Eylea OS today for decreasing fluid now, still present temporal to PED   OD at 6 week post injection Eylea OD, and repeat injection OD today to maintain              ASSESSMENT/PLAN:  Exudative age-related macular degeneration of left eye with active choroidal neovascularization (HCC) OS, with vascularized PED and subretinal fluid yet still improved at 6-week postinjection repeat injection OS today Eylea  Exudative age-related macular degeneration of right eye with active choroidal neovascularization (HCC) OD, subfoveal PED, no intraretinal fluid no subretinal fluid, maintained acuity at 6 weeks post injection Eylea.  Repeat injection Eylea OD today     ICD-10-CM   1. Exudative age-related macular degeneration of right eye with active choroidal neovascularization (HCC)  H35.3211 Intravitreal Injection, Pharmacologic Agent - OD - Right Eye    Intravitreal Injection, Pharmacologic Agent - OS - Left Eye    OCT, Retina - OU - Both Eyes    aflibercept (EYLEA) SOLN 2 mg    aflibercept (EYLEA) SOLN 2 mg    2. Exudative age-related macular degeneration of left eye with active choroidal neovascularization ([place])  H35.3221 OCT, Retina - OU - Both Eyes      OD, vastly improved overall yet still with vascularized PED subfoveal currently at 6-week interval follow-up.  Repeat Eylea today reevaluate next in 7 weeks  2.  OS with vascularized PED as well.  Less subretinal fluid.  At 6-week interval.  Repeat Eylea OS today and follow-up next in 7 weeks  3.  Ophthalmic Meds Ordered this visit:  Meds ordered this encounter  Medications   aflibercept (EYLEA) SOLN 2 mg   aflibercept (EYLEA) SOLN 2 mg       Return in about 7  weeks (around 06/01/2022) for DILATE OU, EYLEA OCT, OD, OS.  There are no Patient Instructions on file for this visit.   Explained the diagnoses, plan, and follow up with the patient and they expressed  understanding.  Patient expressed understanding of the importance of proper follow up care.   Clent Demark Alby Schwabe M.D. Diseases & Surgery of the Retina and Vitreous Retina & Diabetic [place] 04/13/22     Abbreviations: M myopia (nearsighted); A astigmatism; H hyperopia (farsighted); P presbyopia; Mrx spectacle prescription;  CTL contact lenses; OD right eye; OS left eye; OU both eyes  XT exotropia; ET esotropia; PEK punctate epithelial keratitis; PEE punctate epithelial erosions; DES dry eye syndrome; MGD meibomian gland dysfunction; ATs artificial tears; PFAT's preservative free artificial tears; [place] nuclear sclerotic cataract; PSC posterior subcapsular cataract; ERM epi-retinal membrane; PVD posterior vitreous detachment; RD retinal detachment; DM diabetes mellitus; DR diabetic retinopathy; NPDR non-proliferative diabetic retinopathy; PDR proliferative diabetic retinopathy; CSME clinically significant macular edema; DME diabetic macular edema; dbh dot blot hemorrhages; CWS cotton wool spot; POAG primary open angle glaucoma; C/D cup-to-disc ratio; HVF humphrey visual field; GVF goldmann visual field; OCT optical coherence tomography; IOP intraocular pressure; BRVO Branch retinal vein occlusion; CRVO central retinal vein occlusion; CRAO central retinal artery occlusion; BRAO branch retinal artery occlusion; RT retinal tear; SB scleral buckle; PPV pars plana vitrectomy; VH Vitreous hemorrhage; PRP panretinal laser photocoagulation; IVK intravitreal kenalog; VMT vitreomacular traction; MH Macular hole;  NVD neovascularization of the disc; NVE neovascularization elsewhere; AREDS age related eye disease study; ARMD age related macular degeneration; POAG primary open angle glaucoma; EBMD epithelial/anterior basement membrane dystrophy; ACIOL anterior chamber intraocular lens; IOL intraocular lens; PCIOL posterior chamber intraocular lens; Phaco/IOL phacoemulsification with intraocular lens placement; [place]  photorefractive keratectomy; LASIK laser assisted in situ keratomileusis; HTN hypertension; DM diabetes mellitus; COPD chronic obstructive pulmonary disease

## 2022-04-15 ENCOUNTER — Encounter (INDEPENDENT_AMBULATORY_CARE_PROVIDER_SITE_OTHER): Payer: PPO | Admitting: Ophthalmology

## 2022-04-19 DIAGNOSIS — R2681 Unsteadiness on feet: Secondary | ICD-10-CM | POA: Diagnosis not present

## 2022-04-19 DIAGNOSIS — R531 Weakness: Secondary | ICD-10-CM | POA: Diagnosis not present

## 2022-04-26 ENCOUNTER — Other Ambulatory Visit: Payer: Self-pay | Admitting: Psychiatry

## 2022-04-26 DIAGNOSIS — F5101 Primary insomnia: Secondary | ICD-10-CM

## 2022-06-01 ENCOUNTER — Encounter (INDEPENDENT_AMBULATORY_CARE_PROVIDER_SITE_OTHER): Payer: Self-pay | Admitting: Ophthalmology

## 2022-06-01 ENCOUNTER — Encounter: Payer: Self-pay | Admitting: Family Medicine

## 2022-06-01 ENCOUNTER — Ambulatory Visit (INDEPENDENT_AMBULATORY_CARE_PROVIDER_SITE_OTHER): Payer: PPO | Admitting: Ophthalmology

## 2022-06-01 DIAGNOSIS — H353231 Exudative age-related macular degeneration, bilateral, with active choroidal neovascularization: Secondary | ICD-10-CM | POA: Diagnosis not present

## 2022-06-01 DIAGNOSIS — H353211 Exudative age-related macular degeneration, right eye, with active choroidal neovascularization: Secondary | ICD-10-CM

## 2022-06-01 DIAGNOSIS — H35723 Serous detachment of retinal pigment epithelium, bilateral: Secondary | ICD-10-CM

## 2022-06-01 DIAGNOSIS — H353221 Exudative age-related macular degeneration, left eye, with active choroidal neovascularization: Secondary | ICD-10-CM

## 2022-06-01 DIAGNOSIS — E2839 Other primary ovarian failure: Secondary | ICD-10-CM

## 2022-06-01 DIAGNOSIS — H35722 Serous detachment of retinal pigment epithelium, left eye: Secondary | ICD-10-CM

## 2022-06-01 DIAGNOSIS — H35721 Serous detachment of retinal pigment epithelium, right eye: Secondary | ICD-10-CM

## 2022-06-01 MED ORDER — AFLIBERCEPT 2MG/0.05ML IZ SOLN FOR KALEIDOSCOPE
2.0000 mg | INTRAVITREAL | Status: AC | PRN
  Administered 2022-06-01: 2 mg via INTRAVITREAL

## 2022-06-01 NOTE — Assessment & Plan Note (Signed)
Component of wet AMD

## 2022-06-01 NOTE — Assessment & Plan Note (Signed)
Component of wet AMD slightly worse today at 7 weeks.

## 2022-06-01 NOTE — Assessment & Plan Note (Signed)
Today OD much improved and stable with large PED but no subretinal fluid subfoveal with stable acuity.  Currently at 7 weeks shorten interval next to 6 weeks

## 2022-06-01 NOTE — Assessment & Plan Note (Signed)
OS with persistence of subretinal fluid overlying vascularized PED with CNVM still active today on Eylea at current interval of examination 7 weeks.  We will shorten interval examination next

## 2022-06-01 NOTE — Progress Notes (Signed)
06/01/2022     CHIEF COMPLAINT Patient presents for No chief complaint on file.     HISTORY OF PRESENT ILLNESS: Angie Liu is a 76 y.o. female who presents to the clinic today for:   HPI   7 weeks dilate ou eylea od os oct Pt states her vision has been stable Pt denies any new floaters or FOL   Last edited by Morene Rankins, CMA on 06/01/2022  1:44 PM.      Referring physician: Darreld Mclean, MD [address] [place],  [place] [address]  HISTORICAL INFORMATION:   Selected notes from the MEDICAL RECORD NUMBER    Lab Results  Component Value Date   HGBA1C 5.8 01/21/2022     CURRENT MEDICATIONS: No current outpatient medications on file. (Ophthalmic Drugs)   No current facility-administered medications for this visit. (Ophthalmic Drugs)   Current Outpatient Medications (Other)  Medication Sig   ALPRAZolam (XANAX) 0.5 MG tablet Take 1 tablet (0.5 mg total) by mouth 2 (two) times daily as needed for anxiety.   b complex vitamins capsule Take 1 capsule by mouth daily.   cholecalciferol (VITAMIN D) 25 MCG (1000 UNIT) tablet SMARTSIG:1 By Mouth   doxepin (SINEQUAN) 100 MG capsule Take 1 capsule (100 mg total) by mouth at bedtime.   gabapentin (NEURONTIN) 300 MG capsule Take 300 mg by mouth 2 (two) times daily.   Multiple Vitamin (MULTIVITAMIN) capsule Take 1 capsule by mouth daily.   Multiple Vitamins-Minerals (PRESERVISION AREDS 2 PO) Take by mouth.   mupirocin ointment (BACTROBAN) 2 % Apply 1 application. topically daily.   OVER THE COUNTER MEDICATION 4 capsules daily. Calm CP, takes 1 breakfast, 1 at dinner and 2 at bedtime   OVER THE COUNTER MEDICATION Alpha gabba   Prenatal Vit-Fe Fumarate-FA (PRENATAL VITAMINS PO) Take by mouth.   progesterone (PROMETRIUM) 100 MG capsule Take 1 capsule (100 mg total) by mouth daily.   QUERCETIN PO Take by mouth.   SHINGRIX injection    traMADol (ULTRAM) 50 MG tablet Take 50 mg by mouth every 6 (six) hours as  needed.   triamcinolone cream (KENALOG) 0.1 % Apply 1 application. topically 4 (four) times daily.   valACYclovir (VALTREX) 1000 MG tablet Take 1,000 mg by mouth 3 (three) times daily.   venlafaxine XR (EFFEXOR XR) 37.5 MG 24 hr capsule Take 1 capsule (37.5 mg total) by mouth daily with breakfast.   zolpidem (AMBIEN CR) 12.5 MG CR tablet Take 1 tablet (12.5 mg total) by mouth at bedtime as needed for sleep.   zolpidem (AMBIEN CR) 6.25 MG CR tablet TAKE ONE TABLET BY MOUTH AT BEDTIME AS NEEDED FOR SLEEP   No current facility-administered medications for this visit. (Other)      REVIEW OF SYSTEMS: ROS   Negative for: Constitutional, Gastrointestinal, Neurological, Skin, Genitourinary, Musculoskeletal, HENT, Endocrine, Cardiovascular, Eyes, Respiratory, Psychiatric, Allergic/Imm, Heme/Lymph Last edited by Orene Desanctis D, CMA on 06/01/2022  1:44 PM.       ALLERGIES Allergies  Allergen Reactions   Dust Mite Extract    Latex Rash    PAST MEDICAL HISTORY Past Medical History:  Diagnosis Date   Anxiety    Depression    Hayfever    Past Surgical History:  Procedure Laterality Date   CATARACT EXTRACTION W/PHACO Bilateral 2018   TONSILLECTOMY      FAMILY HISTORY Family History  Problem Relation Age of Onset   Cancer Mother  colon   Depression Mother    Diabetes Father    Depression Father    Depression Son    Depression Paternal Uncle     SOCIAL HISTORY Social History   Tobacco Use   Smoking status: Never   Smokeless tobacco: Never  Substance Use Topics   Alcohol use: Yes    Alcohol/week: 2.0 standard drinks of alcohol    Types: 2 Glasses of wine per week    Comment: 2 glass a day   Drug use: No         OPHTHALMIC EXAM:  Base Eye Exam     Visual Acuity (ETDRS)       Right Left   Dist [place] 20/40 20/20   Dist ph [place] 20/25          Tonometry (Tonopen, 1:56 PM)       Right Left   Pressure 11 6         Neuro/Psych     Oriented x3: Yes    Mood/Affect: Normal         Dilation     Both eyes: 1.0% Mydriacyl, 2.5% Phenylephrine @ 1:45 PM           Slit Lamp and Fundus Exam     External Exam       Right Left   External Normal Normal         Slit Lamp Exam       Right Left   Lids/Lashes Normal Normal   Conjunctiva/Sclera White and quiet White and quiet   Cornea Clear Clear   Anterior Chamber Deep and quiet Deep and quiet   Iris Round and reactive Round and reactive   Lens Posterior chamber intraocular lens Posterior chamber intraocular lens   Anterior Vitreous Normal Normal         Fundus Exam       Right Left   Posterior Vitreous Posterior vitreous detachment Posterior vitreous detachment   Disc Normal Normal   C/D Ratio 0.55 0.6   Macula no hemorrhage, no exudates, Retinal pigment epithelial mottling, no macular thickening, Retinal pigment epithelial detachment, Soft drusen Atrophy, Retinal pigment epithelial detachment, Drusen, no exudates, no hemorrhage, no macular thickening, Soft drusen, Intermediate age related macular degeneration, Pigmented atrophy   Vessels Normal Normal   Periphery Normal Normal            IMAGING AND PROCEDURES  Imaging and Procedures for 06/01/22  OCT, Retina - OU - Both Eyes       Right Eye Quality was good. Scan locations included subfoveal. Central Foveal Thickness: 289. Progression has been stable. Findings include no IRF, no SRF, retinal drusen , pigment epithelial detachment.   Left Eye Quality was good. Scan locations included subfoveal. Central Foveal Thickness: 252. Progression has worsened. Findings include no IRF, subretinal fluid.   Notes OS, now 7 weeks weeks post therapy OS , with slight increase in fluid subfoveal, will need to repeat evaluation formally OS in 6 weeks after repeat injection Eylea OS today for decreasing fluid now, still present temporal to PED   OD at 7  week post injection Eylea OD, and repeat injection OD today to maintain  at 6-week concomitant with with findings left      Intravitreal Injection, Pharmacologic Agent - OD - Right Eye       Time Out 06/01/2022. 2:37 PM. Confirmed correct patient, procedure, site, and patient consented.   Anesthesia Topical anesthesia was used. Anesthetic medications included Lidocaine 4%.  Procedure Preparation included 5% betadine to ocular surface, 10% betadine to eyelids, Tobramycin 0.3%, Ofloxacin . A 30 gauge needle was used.   Injection: 2 mg aflibercept 2 MG/0.05ML   Route: Intravitreal, Site: Right Eye   NDC: [id number], Lot: [id number], Expiration date: 11/01/2022, Waste: 0 mL   Post-op Post injection exam found visual acuity of at least counting fingers. The patient tolerated the procedure well. There were no complications. The patient received written and verbal post procedure care education. Post injection medications included ocuflox.      Intravitreal Injection, Pharmacologic Agent - OS - Left Eye       Time Out 06/01/2022. 2:38 PM. Confirmed correct patient, procedure, site, and patient consented.   Anesthesia Topical anesthesia was used. Anesthetic medications included Lidocaine 4%.   Procedure Preparation included 5% betadine to ocular surface, 10% betadine to eyelids, Tobramycin 0.3%. A 30 gauge needle was used.   Injection: 2 mg aflibercept 2 MG/0.05ML   Route: Intravitreal, Site: Left Eye   NDC: [id number], Lot: [id number], Expiration date: 11/01/2022, Waste: 0 mL   Post-op Post injection exam found visual acuity of at least counting fingers. The patient tolerated the procedure well. There were no complications. The patient received written and verbal post procedure care education. Post injection medications included ocuflox.              ASSESSMENT/PLAN:  Exudative age-related macular degeneration of left eye with active choroidal neovascularization (HCC) OS with persistence of subretinal fluid overlying vascularized PED with  CNVM still active today on Eylea at current interval of examination 7 weeks.  We will shorten interval examination next  Exudative age-related macular degeneration of right eye with active choroidal neovascularization (HCC) Today OD much improved and stable with large PED but no subretinal fluid subfoveal with stable acuity.  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     ICD-10-CM   1. Exudative age-related macular degeneration of right eye with active choroidal neovascularization (HCC)  H35.3211 OCT, Retina - OU - Both Eyes    Intravitreal Injection, Pharmacologic Agent - OD - Right Eye    aflibercept (EYLEA) SOLN 2 mg    2. Exudative age-related macular degeneration of left eye with active choroidal neovascularization (HCC)  H35.3221 Intravitreal Injection, Pharmacologic Agent - OS - Left Eye    aflibercept (EYLEA) SOLN 2 mg    3. 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 Serous detachment of retinal pigment epithelium of left eye  H35.722       1.  OD, stable macular findings on OCT and clinically.  Stable acuity.  Large subfoveal PED high risk for progression off therapy.  Repeat of Eylea today  2.  OS with subfoveal vascularized PED also with subretinal fluid slightly worse today at 7-week interval.  Repeat injection OS today reevaluate OU again in 6 weeks  3.  Ophthalmic Meds Ordered this visit:  Meds ordered this encounter  Medications   aflibercept (EYLEA) SOLN 2 mg   aflibercept (EYLEA) SOLN 2 mg       Return in about 6 weeks (around 07/13/2022) for DILATE OU, EYLEA OCT, OD, OS.  There are no Patient Instructions on file for this visit.   Explained the diagnoses, plan, and follow up with the patient  and they expressed understanding.  Patient  expressed understanding of the importance of proper follow up care.   Clent Demark Shizuo Biskup M.D. Diseases & Surgery of the Retina and Vitreous Retina & Diabetic [place] 06/01/22     Abbreviations: M myopia (nearsighted); A astigmatism; H hyperopia (farsighted); P presbyopia; Mrx spectacle prescription;  CTL contact lenses; OD right eye; OS left eye; OU both eyes  XT exotropia; ET esotropia; PEK punctate epithelial keratitis; PEE punctate epithelial erosions; DES dry eye syndrome; MGD meibomian gland dysfunction; ATs artificial tears; PFAT's preservative free artificial tears; [place] nuclear sclerotic cataract; PSC posterior subcapsular cataract; ERM epi-retinal membrane; PVD posterior vitreous detachment; RD retinal detachment; DM diabetes mellitus; DR diabetic retinopathy; NPDR non-proliferative diabetic retinopathy; PDR proliferative diabetic retinopathy; CSME clinically significant macular edema; DME diabetic macular edema; dbh dot blot hemorrhages; CWS cotton wool spot; POAG primary open angle glaucoma; C/D cup-to-disc ratio; HVF humphrey visual field; GVF goldmann visual field; OCT optical coherence tomography; IOP intraocular pressure; BRVO Branch retinal vein occlusion; CRVO central retinal vein occlusion; CRAO central retinal artery occlusion; BRAO branch retinal artery occlusion; RT retinal tear; SB scleral buckle; PPV pars plana vitrectomy; VH Vitreous hemorrhage; PRP panretinal laser photocoagulation; IVK intravitreal kenalog; VMT vitreomacular traction; MH Macular hole;  NVD neovascularization of the disc; NVE neovascularization elsewhere; AREDS age related eye disease study; ARMD age related macular degeneration; POAG primary open angle glaucoma; EBMD epithelial/anterior basement membrane dystrophy; ACIOL anterior chamber intraocular lens; IOL intraocular lens; PCIOL posterior chamber intraocular lens; Phaco/IOL phacoemulsification with intraocular lens placement; [place] photorefractive keratectomy;  LASIK laser assisted in situ keratomileusis; HTN hypertension; DM diabetes mellitus; COPD chronic obstructive pulmonary disease

## 2022-06-22 ENCOUNTER — Ambulatory Visit: Payer: PPO | Admitting: Psychiatry

## 2022-06-22 ENCOUNTER — Encounter: Payer: Self-pay | Admitting: Psychiatry

## 2022-06-22 DIAGNOSIS — F411 Generalized anxiety disorder: Secondary | ICD-10-CM

## 2022-06-22 DIAGNOSIS — F3342 Major depressive disorder, recurrent, in full remission: Secondary | ICD-10-CM

## 2022-06-22 DIAGNOSIS — F5101 Primary insomnia: Secondary | ICD-10-CM | POA: Diagnosis not present

## 2022-06-22 MED ORDER — VENLAFAXINE HCL ER 37.5 MG PO CP24: 37.5000 mg | ORAL_CAPSULE | Freq: Every day | ORAL | 0 refills | Status: DC

## 2022-06-22 MED ORDER — ZOLPIDEM TARTRATE ER 6.25 MG PO TBCR: 6.2500 mg | EXTENDED_RELEASE_TABLET | Freq: Every evening | ORAL | 0 refills | Status: DC | PRN

## 2022-06-22 MED ORDER — ALPRAZOLAM 0.5 MG PO TABS: 0.5000 mg | ORAL_TABLET | Freq: Two times a day (BID) | ORAL | 0 refills | Status: DC | PRN

## 2022-06-22 MED ORDER — DOXEPIN HCL 100 MG PO CAPS: 100.0000 mg | ORAL_CAPSULE | Freq: Every day | ORAL | 0 refills | Status: DC

## 2022-06-22 NOTE — Progress Notes (Signed)
Angie Liu [id number] 1946/01/15 76 y.o.  Subjective:   Patient ID:  Angie Liu is a 76 y.o. (DOB September 27, 1946) female.  Chief Complaint:  Chief Complaint  Patient presents with   Follow-up    Anxiety, insomnia, and depression    HPI Angie Liu presents to the office today for follow-up of anxiety, depression, and insomnia.   She reports that the first night she reduced Ambien CR she only slept 6.25 mg QHS. She then started CBD gummies at night. She reports that one night she took 12.5 mg one night and experienced excessive somnolence.  Denies persistent depression. She has been intentionally losing weight. She has remained gluten free and dairy free.   She has not had any recent falls.   "I'm doing very well." She reports improved mood and enjoyment in things. Improved energy and motivation. She reports that her concentration is ok. Has been learning new music.  Denies SI.   She reports that PT has "made such a huge difference in my life."  She reports, "I drink much less wine." She reports that she can go 1-2 weeks without alcohol. "Now that I have had the experiencing of reducing it greatly, I feel much better."   She has been practicing piano regularly again and is considering having recitals in the future. "My passion for playing is back." She is playing music again every Sunday at [facility]. She has been socializing more with friends and neighbors. She and her husband are planning to attend her 60th HS reunion.   Yolanda Bonine is going to college in [place].   She reports that she usually takes only one Alprazolam 0.5 mg and will occasionally take another tablet with middle of the night awakening. She reports that she has more middle of the night awakenings after reduction in Ambien CR.   Alprazolam last filled 05/26/22 for a 90 day supply.  Ambien CR last filled 04/27/22.   Past Psychotropic Medication Trials: Lyrica Ambien Alprazolam  Doxepin- Has been ineffective  at 25 mg. Noticed some slight improvement in sleep initiation and maintenance with increase to 50 mg po QHS. Started 25 mg on 09/26/17.  Hydroxyzine- Ineffective Xanax Ambien CR- Has been most helpful Ambien Sonata Rozerem- Ineffective Trileptal- Took for several years Remeron Trazodone Bupropion Lexapro- Helpful many years ago but did not respond more recently. Wellbutrin- Tremor Luvox- Took 50 mg po qd Prozac- took briefly and did not respond well Zoloft- "remember I didn't like it." Cymbalta- May have caused tremors Has not taken Celexa, Buspar, Effexor Abilify, Lamictal, Seroquel, Olanzapine   PHQ2-9    Flowsheet Row Clinical Support from 03/05/2022 in [facility] [place] Visit from 01/21/2022 in [facility] [place] Visit from 03/06/2015 in [facility] Total Score 0 0 0  PHQ-9 Total Score 0 0 --        Review of Systems:  Review of Systems  Cardiovascular:        Occ LE edema with prolonged standing  Musculoskeletal:  Negative for gait problem.       Improved balance and strength  Neurological:  Negative for dizziness.  Psychiatric/Behavioral:         Please refer to HPI    Medications: I have reviewed the patient's current medications.  Current Outpatient Medications  Medication Sig Dispense Refill   b complex vitamins capsule Take 1 capsule by mouth daily.  cholecalciferol (VITAMIN D) 25 MCG (1000 UNIT) tablet SMARTSIG:1 By Mouth     Multiple Vitamins-Minerals (PRESERVISION AREDS 2 PO) Take by mouth.     OVER THE COUNTER MEDICATION 4 capsules daily. Calm CP, takes 1 tab TID     OVER THE COUNTER MEDICATION Alpha gabba     Prenatal Vit-Fe Fumarate-FA (PRENATAL VITAMINS PO) Take by mouth.     progesterone (PROMETRIUM) 100 MG capsule Take 1 capsule (100 mg total) by mouth daily. 90 capsule 3   QUERCETIN PO Take by mouth as needed.     [START ON 08/18/2022] ALPRAZolam  (XANAX) 0.5 MG tablet Take 1 tablet (0.5 mg total) by mouth 2 (two) times daily as needed for anxiety. 180 tablet 0   doxepin (SINEQUAN) 100 MG capsule Take 1 capsule (100 mg total) by mouth at bedtime. 90 capsule 0   SHINGRIX injection      traMADol (ULTRAM) 50 MG tablet Take 50 mg by mouth every 6 (six) hours as needed. (Patient not taking: Reported on 06/22/2022)     triamcinolone cream (KENALOG) 0.1 % Apply 1 application. topically 4 (four) times daily. (Patient not taking: Reported on 06/22/2022)     valACYclovir (VALTREX) 1000 MG tablet Take 1,000 mg by mouth 3 (three) times daily. (Patient not taking: Reported on 06/22/2022)     venlafaxine XR (EFFEXOR XR) 37.5 MG 24 hr capsule Take 1 capsule (37.5 mg total) by mouth daily with breakfast. 90 capsule 0   zolpidem (AMBIEN CR) 6.25 MG CR tablet Take 1 tablet (6.25 mg total) by mouth at bedtime as needed. for sleep 90 tablet 0   No current facility-administered medications for this visit.    Medication Side Effects: None  Allergies:  Allergies  Allergen Reactions   Dust Mite Extract    Latex Rash    Past Medical History:  Diagnosis Date   Anxiety    Depression    Hayfever     Past Medical History, Surgical history, Social history, and Family history were reviewed and updated as appropriate.   Please see review of systems for further details on the patient's review from today.   Objective:   Physical Exam:  Wt 154 lb (69.9 kg)   BMI 24.86 kg/m   Physical Exam Constitutional:      General: She is not in acute distress. Musculoskeletal:        General: No deformity.  Neurological:     Mental Status: She is alert and oriented to person, place, and time.     Coordination: Coordination normal.  Psychiatric:        Attention and Perception: Attention and perception normal. She does not perceive auditory or visual hallucinations.        Mood and Affect: Mood normal. Mood is not anxious or depressed. Affect is not labile,  blunt, angry or inappropriate.        Speech: Speech normal.        Behavior: Behavior normal.        Thought Content: Thought content normal. Thought content is not paranoid or delusional. Thought content does not include homicidal or suicidal ideation. Thought content does not include homicidal or suicidal plan.        Cognition and Memory: Cognition and memory normal.        Judgment: Judgment normal.     Comments: Insight intact     Lab Review:     Component Value Date/Time   NA 138 01/21/2022 1338   NA 143 05/25/2021  1517   K 4.5 01/21/2022 1338   CL 105 01/21/2022 1338   CO2 27 01/21/2022 1338   GLUCOSE 108 (H) 01/21/2022 1338   BUN 19 01/21/2022 1338   BUN 16 05/25/2021 1517   CREATININE 0.73 01/21/2022 1338   CALCIUM 9.0 01/21/2022 1338   PROT 6.5 01/21/2022 1338   PROT 6.7 05/25/2021 1517   ALBUMIN 4.8 01/21/2022 1338   ALBUMIN 4.8 (H) 05/25/2021 1517   AST 22 01/21/2022 1338   ALT 21 01/21/2022 1338   ALKPHOS 98 01/21/2022 1338   BILITOT 0.5 01/21/2022 1338   BILITOT 0.2 05/25/2021 1517       Component Value Date/Time   WBC 5.6 01/21/2022 1338   RBC 4.06 01/21/2022 1338   HGB 13.8 01/21/2022 1338   HGB 13.6 05/25/2021 1517   HCT 41.3 01/21/2022 1338   HCT 39.5 05/25/2021 1517   PLT 244.0 01/21/2022 1338   PLT 244 05/25/2021 1517   MCV 101.6 (H) 01/21/2022 1338   MCV 99 (H) 05/25/2021 1517   MCH 34.1 (H) 05/25/2021 1517   MCHC 33.4 01/21/2022 1338   RDW 13.2 01/21/2022 1338   RDW 14.6 05/25/2021 1517   LYMPHSABS 0.9 05/25/2021 1517   EOSABS 0.1 05/25/2021 1517   BASOSABS 0.0 05/25/2021 1517    No results found for: "POCLITH", "LITHIUM"   No results found for: "PHENYTOIN", "PHENOBARB", "VALPROATE", "CBMZ"   .res Assessment: Plan:   Pt seen for 30 minutes and time spent discussing response to dose reduction in Ambien CR. She reports that she has adjusted to dose reduction overall with some slight increase in middle of the night awakenings.  Recommended that she not split Ambien CR 12.5 mg tabs since these are controlled release tabs. Will send in script for Ambien CR 6.25 mg QHS.  Continue Doxepin 100 mg at bedtime for insomnia.  Continue Effexor XR 37.5 mg po qd for depression and anxiety.  Continue Xanax 0.5 mg po BID prn insomnia or anxiety.  Pt to follow-up in 3 months or sooner if clinically indicated.  Patient advised to contact office with any questions, adverse effects, or acute worsening in signs and symptoms.  [place] was seen today for follow-up.  Diagnoses and all orders for this visit:  Recurrent major depressive disorder, in full remission ([place]) -     doxepin (SINEQUAN) 100 MG capsule; Take 1 capsule (100 mg total) by mouth at bedtime. -     venlafaxine XR (EFFEXOR XR) 37.5 MG 24 hr capsule; Take 1 capsule (37.5 mg total) by mouth daily with breakfast.  Generalized anxiety disorder -     ALPRAZolam (XANAX) 0.5 MG tablet; Take 1 tablet (0.5 mg total) by mouth 2 (two) times daily as needed for anxiety. -     venlafaxine XR (EFFEXOR XR) 37.5 MG 24 hr capsule; Take 1 capsule (37.5 mg total) by mouth daily with breakfast.  Primary insomnia -     zolpidem (AMBIEN CR) 6.25 MG CR tablet; Take 1 tablet (6.25 mg total) by mouth at bedtime as needed. for sleep -     doxepin (SINEQUAN) 100 MG capsule; Take 1 capsule (100 mg total) by mouth at bedtime.     Please see After Visit Summary for patient specific instructions.  Future Appointments  Date Time Provider [place]  07/20/2022  1:45 PM Rankin, Clent Demark, MD [facility] None  07/28/2022  9:40 AM Copland, Gay Filler, MD [facility]  09/21/2022  1:00 PM Thayer Headings, PMHNP CP-CP None  03/08/2023  8:20 AM [facility] HEALTH COACH [facility]    No orders of the defined types were placed in this encounter.   -------------------------------

## 2022-06-28 DIAGNOSIS — M8588 Other specified disorders of bone density and structure, other site: Secondary | ICD-10-CM | POA: Diagnosis not present

## 2022-06-28 DIAGNOSIS — Z780 Asymptomatic menopausal state: Secondary | ICD-10-CM | POA: Diagnosis not present

## 2022-06-28 DIAGNOSIS — M810 Age-related osteoporosis without current pathological fracture: Secondary | ICD-10-CM | POA: Diagnosis not present

## 2022-06-28 LAB — HM DEXA SCAN

## 2022-07-06 ENCOUNTER — Other Ambulatory Visit: Payer: Self-pay | Admitting: Family Medicine

## 2022-07-08 ENCOUNTER — Encounter: Payer: Self-pay | Admitting: Family Medicine

## 2022-07-08 DIAGNOSIS — M810 Age-related osteoporosis without current pathological fracture: Secondary | ICD-10-CM | POA: Insufficient documentation

## 2022-07-09 DIAGNOSIS — Z1231 Encounter for screening mammogram for malignant neoplasm of breast: Secondary | ICD-10-CM | POA: Diagnosis not present

## 2022-07-09 LAB — HM MAMMOGRAPHY

## 2022-07-13 ENCOUNTER — Encounter (INDEPENDENT_AMBULATORY_CARE_PROVIDER_SITE_OTHER): Payer: PPO | Admitting: Ophthalmology

## 2022-07-18 MED ORDER — ALENDRONATE SODIUM 70 MG PO TABS: 70.0000 mg | ORAL_TABLET | ORAL | 3 refills | Status: DC

## 2022-07-19 ENCOUNTER — Ambulatory Visit (INDEPENDENT_AMBULATORY_CARE_PROVIDER_SITE_OTHER): Payer: PPO | Admitting: Family Medicine

## 2022-07-19 ENCOUNTER — Encounter: Payer: Self-pay | Admitting: Family Medicine

## 2022-07-19 ENCOUNTER — Ambulatory Visit (HOSPITAL_BASED_OUTPATIENT_CLINIC_OR_DEPARTMENT_OTHER)
Admission: RE | Admit: 2022-07-19 | Discharge: 2022-07-19 | Disposition: A | Discharge status: Home / Self Care | Payer: PPO | Source: Ambulatory Visit | Attending: Family Medicine | Admitting: Family Medicine

## 2022-07-19 VITALS — BP 122/60 | HR 84 | Temp 97.7°F | Resp 18 | Ht 66.0 in | Wt 154.4 lb

## 2022-07-19 DIAGNOSIS — Z23 Encounter for immunization: Secondary | ICD-10-CM

## 2022-07-19 DIAGNOSIS — Z5181 Encounter for therapeutic drug level monitoring: Secondary | ICD-10-CM

## 2022-07-19 DIAGNOSIS — R600 Localized edema: Secondary | ICD-10-CM | POA: Diagnosis not present

## 2022-07-19 DIAGNOSIS — S99921A Unspecified injury of right foot, initial encounter: Secondary | ICD-10-CM | POA: Diagnosis not present

## 2022-07-19 DIAGNOSIS — R7303 Prediabetes: Secondary | ICD-10-CM

## 2022-07-19 DIAGNOSIS — L03115 Cellulitis of right lower limb: Secondary | ICD-10-CM | POA: Diagnosis not present

## 2022-07-19 DIAGNOSIS — S81011A Laceration without foreign body, right knee, initial encounter: Secondary | ICD-10-CM

## 2022-07-19 DIAGNOSIS — R296 Repeated falls: Secondary | ICD-10-CM | POA: Diagnosis not present

## 2022-07-19 LAB — COMPREHENSIVE METABOLIC PANEL
ALT: 17 U/L (ref 0–35)
AST: 20 U/L (ref 0–37)
Albumin: 4 g/dL (ref 3.5–5.2)
Alkaline Phosphatase: 88 U/L (ref 39–117)
BUN: 15 mg/dL (ref 6–23)
CO2: 27 mEq/L (ref 19–32)
Calcium: 8.7 mg/dL (ref 8.4–10.5)
Chloride: 105 mEq/L (ref 96–112)
Creatinine, Ser: 0.71 mg/dL (ref 0.40–1.20)
GFR: 81.71 mL/min (ref >60.00)
Glucose, Bld: 93 mg/dL (ref 70–99)
Potassium: 4 mEq/L (ref 3.5–5.1)
Sodium: 140 mEq/L (ref 135–145)
Total Bilirubin: 0.5 mg/dL (ref 0.2–1.2)
Total Protein: 6 g/dL (ref 6.0–8.3)

## 2022-07-19 LAB — CBC
HCT: 37 % (ref 36.0–46.0)
Hemoglobin: 12.4 g/dL (ref 12.0–15.0)
MCHC: 33.5 g/dL (ref 30.0–36.0)
MCV: 99.8 fl (ref 78.0–100.0)
Platelets: 204 10*3/uL (ref 150.0–400.0)
RBC: 3.71 Mil/uL — ABNORMAL LOW (ref 3.87–5.11)
RDW: 13.6 % (ref 11.5–15.5)
WBC: 4.8 10*3/uL (ref 4.0–10.5)

## 2022-07-19 LAB — BRAIN NATRIURETIC PEPTIDE: Pro B Natriuretic peptide (BNP): 48 pg/mL (ref 0.0–100.0)

## 2022-07-19 LAB — HEMOGLOBIN A1C: Hgb A1c MFr Bld: 5.9 % (ref 4.6–6.5)

## 2022-07-19 LAB — VITAMIN B12: Vitamin B-12: 845 pg/mL (ref 211–911)

## 2022-07-19 MED ORDER — CEPHALEXIN 500 MG PO CAPS: 500.0000 mg | ORAL_CAPSULE | Freq: Two times a day (BID) | ORAL | 0 refills | Status: DC

## 2022-07-19 NOTE — Patient Instructions (Signed)
Good to see you today!  I will be in touch with your toe x-ray asap Keflex antibiotic for one week for your knee - you appear to have some mild infection which I think will do ok, but let me know if not improving Tetanus booster and flu shot today I would encourage you to work on decreasing your dose of ambien if you can- with the help of Ms Eulas Post.  Also would like to get you back in PT when your toe is better!

## 2022-07-19 NOTE — Progress Notes (Signed)
[facility] [place], [place], [place] [address] [phone]  Date:  07/19/2022   Name:  Angie Liu   DOB:  [birth year]   MRN:  [id number]  PCP:  Angie Mclean, MD    Chief Complaint: Wound Infection (Pt fell last wednesday now has possible infected wound on the right knee/shin)   History of Present Illness:  Angie Liu is a 76 y.o. very pleasant female patient who presents with the following:  Same day appt made for concern of a recent fall and laceration to her leg  Last seen by myself in March of this year - history of eye problems, anxiety and depression managed by psychiatry, physical deconditioning and history of falls  I saw her husband earlier today, he notes that she has cut back quite a bit on alcohol and is also decreased her Ambien intake which is great However, he does have some concerns about her memory-nothing dramatic but he wants to make sure we are doing everything we can  About 5 days ago now she had a fall while at the beach- her sandal caught on a step and she fell onto her right knee and hurt her right 2nd toe as well  She has an abrasion over her right knee, the right second toe is bruised and a bit swollen.  She did not hit her head or otherwise get injured.  However, they have noticed some redness around her knee abrasion and were concerned about possible developing infection  Patient notes she had good results with physical therapy in the recent past, she would like to get back to this as soon as her toe feels better Tetanus booster is coming due, she would like to do this today as well as her flu shot  Lab Results  Component Value Date   HGBA1C 5.8 01/21/2022     Patient Active Problem List   Diagnosis Date Noted   Osteoporosis 07/08/2022   Inattention 05/25/2021   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 05/25/2021   Tinnitus of right ear 05/25/2021   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   Tremor of both outstretched hands 05/25/2021   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   Cognitive decline 05/25/2021   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   Exudative age-related macular degeneration of right eye with active choroidal neovascularization ([place]) 06/04/2020   Exudative age-related macular degeneration of left eye with active choroidal neovascularization ([place]) 02/07/2020   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   Posterior vitreous detachment of left eye 02/07/2020   Intermediate stage nonexudative age-related macular degeneration of left eye 02/07/2020   Intermediate stage nonexudative age-related macular degeneration of right eye 02/07/2020   GAD (generalized anxiety disorder) 11/28/2018   Recurrent major depression in partial remission ([place]) 11/28/2018   Chronic insomnia 02/02/2018   Depression 02/02/2018   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   Sensorineural hearing loss (SNHL), 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   Tinnitus of both ears 10/17/2017   Seborrheic dermatitis 07/21/2017   Allergic rhinitis, seasonal 10/23/2012   Depression with anxiety 10/23/2012    Past Medical History:  Diagnosis Date   Anxiety    Depression    Hayfever     Past Surgical History:  Procedure Laterality Date   CATARACT EXTRACTION W/PHACO Bilateral 2018   TONSILLECTOMY      Social History   Tobacco Use   Smoking status: Never   Smokeless  tobacco: Never  Substance Use Topics   Alcohol use: Yes    Alcohol/week: 2.0 standard drinks of alcohol    Types: 2 Glasses of wine per week    Comment: 2 glass a day   Drug use: No    Family History  Problem Relation Age of Onset   Cancer Mother        colon   Depression Mother    Diabetes Father    Depression Father    Depression Son    Depression Paternal Uncle     Allergies  Allergen Reactions   Dust Mite Extract    Latex Rash    Medication list has been  reviewed and updated.  Current Outpatient Medications on File Prior to Visit  Medication Sig Dispense Refill   alendronate (FOSAMAX) 70 MG tablet Take 1 tablet (70 mg total) by mouth every 7 (seven) days. Take with a full glass of water on an empty stomach. 12 tablet 3   [START ON 08/18/2022] ALPRAZolam (XANAX) 0.5 MG tablet Take 1 tablet (0.5 mg total) by mouth 2 (two) times daily as needed for anxiety. 180 tablet 0   b complex vitamins capsule Take 1 capsule by mouth daily.     cholecalciferol (VITAMIN D) 25 MCG (1000 UNIT) tablet SMARTSIG:1 By Mouth     doxepin (SINEQUAN) 100 MG capsule Take 1 capsule (100 mg total) by mouth at bedtime. 90 capsule 0   Multiple Vitamins-Minerals (PRESERVISION AREDS 2 PO) Take by mouth.     OVER THE COUNTER MEDICATION 4 capsules daily. Calm CP, takes 1 tab TID     OVER THE COUNTER MEDICATION Alpha gabba     Prenatal Vit-Fe Fumarate-FA (PRENATAL VITAMINS PO) Take by mouth.     progesterone (PROMETRIUM) 100 MG capsule TAKE ONE CAPSULE BY MOUTH ONE TIME DAILY 90 capsule 0   QUERCETIN PO Take by mouth as needed.     SHINGRIX injection      traMADol (ULTRAM) 50 MG tablet Take 50 mg by mouth every 6 (six) hours as needed.     triamcinolone cream (KENALOG) 0.1 % Apply 1 application  topically 4 (four) times daily.     valACYclovir (VALTREX) 1000 MG tablet Take 1,000 mg by mouth 3 (three) times daily.     venlafaxine XR (EFFEXOR XR) 37.5 MG 24 hr capsule Take 1 capsule (37.5 mg total) by mouth daily with breakfast. 90 capsule 0   zolpidem (AMBIEN CR) 6.25 MG CR tablet Take 1 tablet (6.25 mg total) by mouth at bedtime as needed. for sleep 90 tablet 0   No current facility-administered medications on file prior to visit.    Review of Systems:  As per HPI- otherwise negative.   Physical Examination: Vitals:   07/19/22 0951  BP: 122/60  Pulse: 84  Resp: 18  Temp: 97.7 F (36.5 C)  SpO2: 96%   Vitals:   07/19/22 0951  Weight: 154 lb 6.4 oz (70 kg)   Height: '5\' 6"'$  (1.676 m)   Body mass index is 24.92 kg/m. Ideal Body Weight: Weight in (lb) to have BMI = 25: 154.6  GEN: no acute distress.  Looks well, normal weight HEENT: Atraumatic, Normocephalic.  Ears and Nose: No external deformity. CV: RRR, No M/G/R. No JVD. No thrill. No extra heart sounds. PULM: CTA B, no wheezes, crackles, rhonchi. No retractions. No resp. distress. No accessory muscle use. ABD: S, NT, ND, +BS. No rebound. No HSM. EXTR: No c/c/e PSYCH: Normally interactive. Conversant.  Abrasion over  right knee with mild surrounding erythema, right second toe is tender and bruised though no obvious deformity Knee exam is otherwise normal for age, do not suspect a fracture    Assessment and Plan: Cellulitis of right knee - Plan: cephALEXin (KEFLEX) 500 MG capsule  Laceration of right knee, initial encounter - Plan: Td vaccine greater than or equal to 7yo preservative free IM  Injury of toe on right foot, initial encounter - Plan: DG Foot Complete Right  Frequent falls - Plan: Vitamin B12  Prediabetes - Plan: Hemoglobin A1c  Medication monitoring encounter - Plan: CBC, Comprehensive metabolic panel  Lower extremity edema - Plan: B Nat Peptide  Need for influenza vaccination - Plan: Flu Vaccine QUAD High Dose(Fluad)  Patient seen today for evaluation of injuries as above. We will have her start on Keflex for any cellulitis developing around the wound, ordered x-ray for her toe Discussed buddy taping the toe for support She has noticed some increase in mild edema of both legs, thought due to venous insufficiency.  She has not been able to walk as much since she got hurt.  We will obtain a BNP to rule out CHF exacerbation  Gave flu shot and tetanus booster today  Other routine labs are pending as above  Plan to restart PT once her toe is healed Signed Lamar Blinks, MD  Received labs as below, message to patient Results for orders placed or performed in  visit on 07/19/22  CBC  Result Value Ref Range   WBC 4.8 4.0 - 10.5 K/uL   RBC 3.71 (L) 3.87 - 5.11 Mil/uL   Platelets 204.0 150.0 - 400.0 K/uL   Hemoglobin 12.4 12.0 - 15.0 g/dL   HCT 37.0 36.0 - 46.0 %   MCV 99.8 78.0 - 100.0 fl   MCHC 33.5 30.0 - 36.0 g/dL   RDW 13.6 11.5 - 15.5 %  Comprehensive metabolic panel  Result Value Ref Range   Sodium 140 135 - 145 mEq/L   Potassium 4.0 3.5 - 5.1 mEq/L   Chloride 105 96 - 112 mEq/L   CO2 27 19 - 32 mEq/L   Glucose, Bld 93 70 - 99 mg/dL   BUN 15 6 - 23 mg/dL   Creatinine, Ser 0.71 0.40 - 1.20 mg/dL   Total Bilirubin 0.5 0.2 - 1.2 mg/dL   Alkaline Phosphatase 88 39 - 117 U/L   AST 20 0 - 37 U/L   ALT 17 0 - 35 U/L   Total Protein 6.0 6.0 - 8.3 g/dL   Albumin 4.0 3.5 - 5.2 g/dL   GFR 81.71 >60.00 mL/min   Calcium 8.7 8.4 - 10.5 mg/dL  Hemoglobin A1c  Result Value Ref Range   Hgb A1c MFr Bld 5.9 4.6 - 6.5 %  Vitamin B12  Result Value Ref Range   Vitamin B-12 845 211 - 911 pg/mL  B Nat Peptide  Result Value Ref Range   Pro B Natriuretic peptide (BNP) 48.0 0.0 - 100.0 pg/mL

## 2022-07-20 ENCOUNTER — Ambulatory Visit (INDEPENDENT_AMBULATORY_CARE_PROVIDER_SITE_OTHER): Payer: PPO | Admitting: Ophthalmology

## 2022-07-20 ENCOUNTER — Encounter (INDEPENDENT_AMBULATORY_CARE_PROVIDER_SITE_OTHER): Payer: Self-pay | Admitting: Ophthalmology

## 2022-07-20 ENCOUNTER — Encounter: Payer: Self-pay | Admitting: Family Medicine

## 2022-07-20 DIAGNOSIS — H353221 Exudative age-related macular degeneration, left eye, with active choroidal neovascularization: Secondary | ICD-10-CM

## 2022-07-20 DIAGNOSIS — H353211 Exudative age-related macular degeneration, right eye, with active choroidal neovascularization: Secondary | ICD-10-CM | POA: Diagnosis not present

## 2022-07-20 DIAGNOSIS — H353231 Exudative age-related macular degeneration, bilateral, with active choroidal neovascularization: Secondary | ICD-10-CM | POA: Diagnosis not present

## 2022-07-20 MED ORDER — AFLIBERCEPT 2MG/0.05ML IZ SOLN FOR KALEIDOSCOPE
2.0000 mg | INTRAVITREAL | Status: AC | PRN
  Administered 2022-07-20: 2 mg via INTRAVITREAL

## 2022-07-20 NOTE — Assessment & Plan Note (Signed)
Doing well post inj 7 weeks previously,,  Repeat injection today and reevaluate next in 8 weeks

## 2022-07-20 NOTE — Progress Notes (Signed)
07/20/2022     CHIEF COMPLAINT Patient presents for  Chief Complaint  Patient presents with   Macular Degeneration      HISTORY OF PRESENT ILLNESS: Angie Liu is a 76 y.o. female who presents to the clinic today for:   HPI   Age related macular degeneration of right eye 6 weeks dilate ou eylea oct od os Pt states her vision has been stable Pt denies any new floaters or FOL Last edited by Morene Rankins, CMA on 07/20/2022  2:13 PM.      Referring physician: Darreld Mclean, MD [address] [place],  [place] [address]  HISTORICAL INFORMATION:   Selected notes from the MEDICAL RECORD NUMBER    Lab Results  Component Value Date   HGBA1C 5.9 07/19/2022     CURRENT MEDICATIONS: No current outpatient medications on file. (Ophthalmic Drugs)   No current facility-administered medications for this visit. (Ophthalmic Drugs)   Current Outpatient Medications (Other)  Medication Sig   alendronate (FOSAMAX) 70 MG tablet Take 1 tablet (70 mg total) by mouth every 7 (seven) days. Take with a full glass of water on an empty stomach.   [START ON 08/18/2022] ALPRAZolam (XANAX) 0.5 MG tablet Take 1 tablet (0.5 mg total) by mouth 2 (two) times daily as needed for anxiety.   b complex vitamins capsule Take 1 capsule by mouth daily.   cephALEXin (KEFLEX) 500 MG capsule Take 1 capsule (500 mg total) by mouth 2 (two) times daily.   cholecalciferol (VITAMIN D) 25 MCG (1000 UNIT) tablet SMARTSIG:1 By Mouth   doxepin (SINEQUAN) 100 MG capsule Take 1 capsule (100 mg total) by mouth at bedtime.   Multiple Vitamins-Minerals (PRESERVISION AREDS 2 PO) Take by mouth.   OVER THE COUNTER MEDICATION 4 capsules daily. Calm CP, takes 1 tab TID   OVER THE COUNTER MEDICATION Alpha gabba   Prenatal Vit-Fe Fumarate-FA (PRENATAL VITAMINS PO) Take by mouth.   progesterone (PROMETRIUM) 100 MG capsule TAKE ONE CAPSULE BY MOUTH ONE TIME DAILY   QUERCETIN PO Take by mouth as needed.    SHINGRIX injection    traMADol (ULTRAM) 50 MG tablet Take 50 mg by mouth every 6 (six) hours as needed.   triamcinolone cream (KENALOG) 0.1 % Apply 1 application  topically 4 (four) times daily.   valACYclovir (VALTREX) 1000 MG tablet Take 1,000 mg by mouth 3 (three) times daily.   venlafaxine XR (EFFEXOR XR) 37.5 MG 24 hr capsule Take 1 capsule (37.5 mg total) by mouth daily with breakfast.   zolpidem (AMBIEN CR) 6.25 MG CR tablet Take 1 tablet (6.25 mg total) by mouth at bedtime as needed. for sleep   No current facility-administered medications for this visit. (Other)      REVIEW OF SYSTEMS: ROS   Negative for: Constitutional, Gastrointestinal, Neurological, Skin, Genitourinary, Musculoskeletal, HENT, Endocrine, Cardiovascular, Eyes, Respiratory, Psychiatric, Allergic/Imm, Heme/Lymph Last edited by Orene Desanctis D, CMA on 07/20/2022  2:13 PM.       ALLERGIES Allergies  Allergen Reactions   Dust Mite Extract    Latex Rash    PAST MEDICAL HISTORY Past Medical History:  Diagnosis Date   Anxiety    Depression    Hayfever    Past Surgical History:  Procedure Laterality Date   CATARACT EXTRACTION W/PHACO Bilateral 2018   TONSILLECTOMY      FAMILY HISTORY Family History  Problem Relation Age of Onset   Cancer Mother  colon   Depression Mother    Diabetes Father    Depression Father    Depression Son    Depression Paternal Uncle     SOCIAL HISTORY Social History   Tobacco Use   Smoking status: Never   Smokeless tobacco: Never  Substance Use Topics   Alcohol use: Yes    Alcohol/week: 2.0 standard drinks of alcohol    Types: 2 Glasses of wine per week    Comment: 2 glass a day   Drug use: No         OPHTHALMIC EXAM:  Base Eye Exam     Visual Acuity (ETDRS)       Right Left   Dist cc 20/25 -2 20/20 -1    Correction: Glasses         Tonometry (Tonopen, 2:19 PM)       Right Left   Pressure 10 5         Pupils       Pupils    Right PERRL   Left PERRL         Visual Fields       Left Right    Full Full         Extraocular Movement       Right Left    Ortho Ortho    -- -- --  --  --  -- -- --   -- -- --  --  --  -- -- --           Neuro/Psych     Oriented x3: Yes   Mood/Affect: Normal         Dilation     Both eyes: 1.0% Mydriacyl, 2.5% Phenylephrine @ 2:14 PM           Slit Lamp and Fundus Exam     External Exam       Right Left   External Normal Normal         Slit Lamp Exam       Right Left   Lids/Lashes Normal Normal   Conjunctiva/Sclera White and quiet White and quiet   Cornea Clear Clear   Anterior Chamber Deep and quiet Deep and quiet   Iris Round and reactive Round and reactive   Lens Posterior chamber intraocular lens Posterior chamber intraocular lens   Anterior Vitreous Normal Normal         Fundus Exam       Right Left   Posterior Vitreous Posterior vitreous detachment Posterior vitreous detachment   Disc Normal Normal   C/D Ratio 0.55 0.6   Macula no hemorrhage, no exudates, Retinal pigment epithelial mottling, no macular thickening, Retinal pigment epithelial detachment, Soft drusen Atrophy, Retinal pigment epithelial detachment, Drusen, no exudates, no hemorrhage, no macular thickening, Soft drusen, Intermediate age related macular degeneration, Pigmented atrophy   Vessels Normal Normal   Periphery Normal Normal            IMAGING AND PROCEDURES  Imaging and Procedures for 07/20/22  OCT, Retina - OU - Both Eyes       Right Eye Quality was good. Scan locations included subfoveal. Central Foveal Thickness: 301. Progression has been stable. Findings include no IRF, no SRF, retinal drusen , pigment epithelial detachment.   Left Eye Quality was good. Scan locations included subfoveal. Central Foveal Thickness: 259. Progression has been stable. Findings include no IRF, subretinal fluid.   Notes OS, now 7 weeks weeks post therapy OS ,  with slight  increase in fluid subfoveal, will need to repeat evaluation formally OS in 8 weeks after repeat injection Eylea OS today for decreasing fluid now, still present temporal to PED   OD at 7  week post injection Eylea OD, and repeat injection OD today to maintain at 8-week concomitant with with findings left     Intravitreal Injection, Pharmacologic Agent - OD - Right Eye       Time Out 07/20/2022. 3:18 PM. Confirmed correct patient, procedure, site, and patient consented.   Anesthesia Topical anesthesia was used. Anesthetic medications included Lidocaine 4%.   Procedure Preparation included 5% betadine to ocular surface, 10% betadine to eyelids, Tobramycin 0.3%, Ofloxacin . A 30 gauge needle was used.   Injection: 2 mg aflibercept 2 MG/0.05ML   Route: Intravitreal, Site: Right Eye   NDC: A3590391, Lot: [id number], Expiration date: 07/04/2023, Waste: 0 mL   Post-op Post injection exam found visual acuity of at least counting fingers. The patient tolerated the procedure well. There were no complications. The patient received written and verbal post procedure care education. Post injection medications included ocuflox.      Intravitreal Injection, Pharmacologic Agent - OS - Left Eye       Time Out 07/20/2022. 3:18 PM. Confirmed correct patient, procedure, site, and patient consented.   Anesthesia Topical anesthesia was used. Anesthetic medications included Lidocaine 4%.   Procedure Preparation included 5% betadine to ocular surface, 10% betadine to eyelids, Tobramycin 0.3%. A 30 gauge needle was used.   Injection: 2 mg aflibercept 2 MG/0.05ML   Route: Intravitreal, Site: Left Eye   NDC: A3590391, Lot: [id number], Expiration date: 07/04/2023, Waste: 0 mL   Post-op Post injection exam found visual acuity of at least counting fingers. The patient tolerated the procedure well. There were no complications. The patient received written and verbal post procedure care  education. Post injection medications included ocuflox.              ASSESSMENT/PLAN:  Exudative age-related macular degeneration of right eye with active choroidal neovascularization (HCC) Doing well post inj 7 weeks previously,,  Repeat injection today and reevaluate next in 8 weeks  Exudative age-related macular degeneration of left eye with active choroidal neovascularization (HCC) Chronic subretinal fluid over vascularized PED stable acuity at 7 weeks.  We will repeat injection today to maintain reevaluate next in 8 weeks     ICD-10-CM   1. Exudative age-related macular degeneration of right eye with active choroidal neovascularization (HCC)  H35.3211 OCT, Retina - OU - Both Eyes    Intravitreal Injection, Pharmacologic Agent - OD - Right Eye    aflibercept (EYLEA) SOLN 2 mg    2. Exudative age-related macular degeneration of left eye with active choroidal neovascularization (HCC)  H35.3221 Intravitreal Injection, Pharmacologic Agent - OS - Left Eye    aflibercept (EYLEA) SOLN 2 mg      1.  You stable over time.  Currently at 7-week follow-up interval post Eylea OU.  Repeat injection today per patient request.  OU.  Follow-up next in 8 weeks  2.  3.  Ophthalmic Meds Ordered this visit:  Meds ordered this encounter  Medications   aflibercept (EYLEA) SOLN 2 mg   aflibercept (EYLEA) SOLN 2 mg       Return in about 8 weeks (around 09/14/2022) for DILATE OU, EYLEA OCT, OD, OS.  Patient Instructions  Instructed to call 2 weeks prior to next visit so as to confirm participation with insurance company named, [employer]  Explained the diagnoses, plan, and follow up with the patient and they expressed understanding.  Patient expressed understanding of the importance of proper follow up care.   Clent Demark Presten Joost M.D. Diseases & Surgery of the Retina and Vitreous [facility] [place] 07/20/22     Abbreviations: M myopia (nearsighted); A  astigmatism; H hyperopia (farsighted); P presbyopia; Mrx spectacle prescription;  CTL contact lenses; OD right eye; OS left eye; OU both eyes  XT exotropia; ET esotropia; PEK punctate epithelial keratitis; PEE punctate epithelial erosions; DES dry eye syndrome; MGD meibomian gland dysfunction; ATs artificial tears; PFAT's preservative free artificial tears; [place] nuclear sclerotic cataract; PSC posterior subcapsular cataract; ERM epi-retinal membrane; PVD posterior vitreous detachment; RD retinal detachment; DM diabetes mellitus; DR diabetic retinopathy; NPDR non-proliferative diabetic retinopathy; PDR proliferative diabetic retinopathy; CSME clinically significant macular edema; DME diabetic macular edema; dbh dot blot hemorrhages; CWS cotton wool spot; POAG primary open angle glaucoma; C/D cup-to-disc ratio; HVF humphrey visual field; GVF goldmann visual field; OCT optical coherence tomography; IOP intraocular pressure; BRVO Branch retinal vein occlusion; CRVO central retinal vein occlusion; CRAO central retinal artery occlusion; BRAO branch retinal artery occlusion; RT retinal tear; SB scleral buckle; PPV pars plana vitrectomy; VH Vitreous hemorrhage; PRP panretinal laser photocoagulation; IVK intravitreal kenalog; VMT vitreomacular traction; MH Macular hole;  NVD neovascularization of the disc; NVE neovascularization elsewhere; AREDS age related eye disease study; ARMD age related macular degeneration; POAG primary open angle glaucoma; EBMD epithelial/anterior basement membrane dystrophy; ACIOL anterior chamber intraocular lens; IOL intraocular lens; PCIOL posterior chamber intraocular lens; Phaco/IOL phacoemulsification with intraocular lens placement; [place] photorefractive keratectomy; LASIK laser assisted in situ keratomileusis; HTN hypertension; DM diabetes mellitus; COPD chronic obstructive pulmonary disease

## 2022-07-20 NOTE — Assessment & Plan Note (Signed)
Chronic subretinal fluid over vascularized PED stable acuity at 7 weeks.  We will repeat injection today to maintain reevaluate next in 8 weeks

## 2022-07-20 NOTE — Patient Instructions (Signed)
Instructed to call 2 weeks prior to next visit so as to confirm participation with insurance company named, [employer]

## 2022-07-25 ENCOUNTER — Encounter: Payer: Self-pay | Admitting: Family Medicine

## 2022-07-25 NOTE — Progress Notes (Unsigned)
[facility] [address], [place], [place] [address] [phone]  Date:  07/28/2022   Name:  Angie Liu   DOB:  October 22, 1946   MRN:  [id number]  PCP:  Darreld Mclean, MD    Chief Complaint: No chief complaint on file.   History of Present Illness:  Angie Liu is a 76 y.o. 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   Exudative age-related macular degeneration of right eye with active choroidal neovascularization ([place]) 06/04/2020   Exudative age-related macular degeneration of left eye with active choroidal neovascularization ([place]) 02/07/2020   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   Posterior vitreous detachment of left eye 02/07/2020   Intermediate stage nonexudative age-related macular degeneration of left eye 02/07/2020   Intermediate stage nonexudative age-related macular degeneration of right eye 02/07/2020   GAD (generalized anxiety disorder) 11/28/2018   Recurrent major depression in partial remission ([place]) 11/28/2018   Chronic insomnia 02/02/2018   Depression 02/02/2018   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   Sensorineural hearing loss (SNHL), 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   Tinnitus of both ears 10/17/2017   Seborrheic dermatitis 07/21/2017   Allergic rhinitis, seasonal 10/23/2012   Depression with anxiety 10/23/2012    Past Medical History:  Diagnosis Date   Anxiety     Depression    Hayfever     Past Surgical History:  Procedure Laterality Date   CATARACT EXTRACTION W/PHACO Bilateral 2018   TONSILLECTOMY      Social History   Tobacco Use   Smoking status: Never   Smokeless tobacco: Never  Substance Use Topics   Alcohol use: Yes    Alcohol/week: 2.0 standard drinks of alcohol    Types: 2 Glasses of wine per week    Comment: 2 glass a day   Drug use: No    Family History  Problem Relation Age of Onset   Cancer Mother        colon   Depression Mother    Diabetes Father    Depression Father    Depression Son    Depression Paternal Uncle     Allergies  Allergen Reactions   Dust Mite Extract    Latex Rash    Medication list has been reviewed and updated.  Current Outpatient Medications on File Prior to Visit  Medication Sig Dispense Refill   alendronate (FOSAMAX) 70 MG tablet Take 1 tablet (70 mg total) by mouth every 7 (seven) days. Take with a full glass of water on an empty stomach. 12 tablet 3   [START ON 08/18/2022] ALPRAZolam (XANAX) 0.5 MG tablet Take 1 tablet (0.5 mg total) by mouth 2 (two) times daily as needed for anxiety. 180 tablet 0   b complex vitamins capsule Take 1 capsule by mouth daily.     cephALEXin (KEFLEX) 500 MG capsule Take 1 capsule (500 mg total) by mouth 2 (two) times daily. 14 capsule 0   cholecalciferol (VITAMIN D) 25  MCG (1000 UNIT) tablet SMARTSIG:1 By Mouth     doxepin (SINEQUAN) 100 MG capsule Take 1 capsule (100 mg total) by mouth at bedtime. 90 capsule 0   Multiple Vitamins-Minerals (PRESERVISION AREDS 2 PO) Take by mouth.     OVER THE COUNTER MEDICATION 4 capsules daily. Calm CP, takes 1 tab TID     OVER THE COUNTER MEDICATION Alpha gabba     Prenatal Vit-Fe Fumarate-FA (PRENATAL VITAMINS PO) Take by mouth.     progesterone (PROMETRIUM) 100 MG capsule TAKE ONE CAPSULE BY MOUTH ONE TIME DAILY 90 capsule 0   QUERCETIN PO Take by mouth as needed.     SHINGRIX injection      traMADol (ULTRAM) 50 MG  tablet Take 50 mg by mouth every 6 (six) hours as needed.     triamcinolone cream (KENALOG) 0.1 % Apply 1 application  topically 4 (four) times daily.     valACYclovir (VALTREX) 1000 MG tablet Take 1,000 mg by mouth 3 (three) times daily.     venlafaxine XR (EFFEXOR XR) 37.5 MG 24 hr capsule Take 1 capsule (37.5 mg total) by mouth daily with breakfast. 90 capsule 0   zolpidem (AMBIEN CR) 6.25 MG CR tablet Take 1 tablet (6.25 mg total) by mouth at bedtime as needed. for sleep 90 tablet 0   No current facility-administered medications on file prior to visit.    Review of Systems:  ***  Physical Examination: There were no vitals filed for this visit. There were no vitals filed for this visit. There is no height or weight on file to calculate BMI. Ideal Body Weight:    ***  Assessment and Plan: ***  Signed Lamar Blinks, MD

## 2022-07-26 ENCOUNTER — Other Ambulatory Visit: Payer: Self-pay | Admitting: Family Medicine

## 2022-07-26 ENCOUNTER — Encounter: Payer: Self-pay | Admitting: Family Medicine

## 2022-07-26 DIAGNOSIS — L03115 Cellulitis of right lower limb: Secondary | ICD-10-CM

## 2022-07-28 ENCOUNTER — Ambulatory Visit (INDEPENDENT_AMBULATORY_CARE_PROVIDER_SITE_OTHER): Payer: PPO | Admitting: Family Medicine

## 2022-07-28 VITALS — BP 132/72 | HR 94 | Temp 97.7°F | Resp 18 | Ht 66.0 in | Wt 155.4 lb

## 2022-07-28 DIAGNOSIS — R296 Repeated falls: Secondary | ICD-10-CM | POA: Diagnosis not present

## 2022-07-28 DIAGNOSIS — R059 Cough, unspecified: Secondary | ICD-10-CM | POA: Diagnosis not present

## 2022-07-28 LAB — POC COVID19 BINAXNOW: SARS Coronavirus 2 Ag: NEGATIVE

## 2022-07-28 LAB — POCT INFLUENZA A/B
Influenza A, POC: NEGATIVE
Influenza B, POC: NEGATIVE

## 2022-07-28 NOTE — Patient Instructions (Addendum)
It was good to see you again today!   Flu and covid tests are negative- you likely do have "a cold" as you suspected.  As long as you continue to get better we are on the right track but let me know if you are getting worse  I placed a referral to Physical Therapy at [facility] for you as requested- certainly give them a call and schedule your next visit!    Ok to do some walking and start wearing your compression again if you like - let me know if this does not bring your swelling back to normal baseline

## 2022-07-30 DIAGNOSIS — I83893 Varicose veins of bilateral lower extremities with other complications: Secondary | ICD-10-CM | POA: Diagnosis not present

## 2022-08-03 ENCOUNTER — Ambulatory Visit: Payer: PPO | Admitting: Psychiatry

## 2022-08-11 DIAGNOSIS — R600 Localized edema: Secondary | ICD-10-CM | POA: Diagnosis not present

## 2022-08-11 DIAGNOSIS — I872 Venous insufficiency (chronic) (peripheral): Secondary | ICD-10-CM | POA: Diagnosis not present

## 2022-08-11 DIAGNOSIS — I890 Lymphedema, not elsewhere classified: Secondary | ICD-10-CM | POA: Diagnosis not present

## 2022-08-12 DIAGNOSIS — M25661 Stiffness of right knee, not elsewhere classified: Secondary | ICD-10-CM | POA: Diagnosis not present

## 2022-08-12 DIAGNOSIS — R262 Difficulty in walking, not elsewhere classified: Secondary | ICD-10-CM | POA: Diagnosis not present

## 2022-08-12 DIAGNOSIS — M25561 Pain in right knee: Secondary | ICD-10-CM | POA: Diagnosis not present

## 2022-08-12 DIAGNOSIS — R531 Weakness: Secondary | ICD-10-CM | POA: Diagnosis not present

## 2022-08-13 DIAGNOSIS — H6123 Impacted cerumen, bilateral: Secondary | ICD-10-CM | POA: Diagnosis not present

## 2022-08-17 ENCOUNTER — Other Ambulatory Visit (HOSPITAL_BASED_OUTPATIENT_CLINIC_OR_DEPARTMENT_OTHER): Payer: Self-pay

## 2022-08-17 ENCOUNTER — Encounter (HOSPITAL_BASED_OUTPATIENT_CLINIC_OR_DEPARTMENT_OTHER): Payer: Self-pay | Admitting: Emergency Medicine

## 2022-08-17 ENCOUNTER — Emergency Department (HOSPITAL_BASED_OUTPATIENT_CLINIC_OR_DEPARTMENT_OTHER)
Admission: EM | Admit: 2022-08-17 | Discharge: 2022-08-17 | Disposition: A | Discharge status: Home / Self Care | Payer: PPO | Attending: Emergency Medicine | Admitting: Emergency Medicine

## 2022-08-17 ENCOUNTER — Other Ambulatory Visit: Payer: Self-pay

## 2022-08-17 DIAGNOSIS — Z20822 Contact with and (suspected) exposure to covid-19: Secondary | ICD-10-CM | POA: Diagnosis not present

## 2022-08-17 DIAGNOSIS — R112 Nausea with vomiting, unspecified: Secondary | ICD-10-CM | POA: Insufficient documentation

## 2022-08-17 DIAGNOSIS — R197 Diarrhea, unspecified: Secondary | ICD-10-CM | POA: Insufficient documentation

## 2022-08-17 DIAGNOSIS — Z91040 Latex allergy status: Secondary | ICD-10-CM | POA: Insufficient documentation

## 2022-08-17 LAB — CBC WITH DIFFERENTIAL/PLATELET
Abs Immature Granulocytes: 0.02 10*3/uL (ref 0.00–0.07)
Basophils Absolute: 0 10*3/uL (ref 0.0–0.1)
Basophils Relative: 0 %
Eosinophils Absolute: 0.7 10*3/uL — ABNORMAL HIGH (ref 0.0–0.5)
Eosinophils Relative: 11 %
HCT: 41.3 % (ref 36.0–46.0)
Hemoglobin: 14.2 g/dL (ref 12.0–15.0)
Immature Granulocytes: 0 %
Lymphocytes Relative: 23 %
Lymphs Abs: 1.4 10*3/uL (ref 0.7–4.0)
MCH: 32.9 pg (ref 26.0–34.0)
MCHC: 34.4 g/dL (ref 30.0–36.0)
MCV: 95.8 fL (ref 80.0–100.0)
Monocytes Absolute: 0.5 10*3/uL (ref 0.1–1.0)
Monocytes Relative: 8 %
Neutro Abs: 3.6 10*3/uL (ref 1.7–7.7)
Neutrophils Relative %: 58 %
Platelets: 232 10*3/uL (ref 150–400)
RBC: 4.31 MIL/uL (ref 3.87–5.11)
RDW: 13.2 % (ref 11.5–15.5)
WBC: 6.1 10*3/uL (ref 4.0–10.5)
nRBC: 0 % (ref 0.0–0.2)

## 2022-08-17 LAB — BASIC METABOLIC PANEL
Anion gap: 9 (ref 5–15)
BUN: 10 mg/dL (ref 8–23)
CO2: 24 mmol/L (ref 22–32)
Calcium: 8.3 mg/dL — ABNORMAL LOW (ref 8.9–10.3)
Chloride: 106 mmol/L (ref 98–111)
Creatinine, Ser: 0.56 mg/dL (ref 0.44–1.00)
GFR, Estimated: >60 mL/min (ref >60)
Glucose, Bld: 87 mg/dL (ref 70–99)
Potassium: 3.4 mmol/L — ABNORMAL LOW (ref 3.5–5.1)
Sodium: 139 mmol/L (ref 135–145)

## 2022-08-17 LAB — RESP PANEL BY RT-PCR (FLU A&B, COVID) ARPGX2
Influenza A by PCR: NEGATIVE
Influenza B by PCR: NEGATIVE
SARS Coronavirus 2 by RT PCR: NEGATIVE

## 2022-08-17 LAB — LIPASE, BLOOD: Lipase: 42 U/L (ref 11–51)

## 2022-08-17 MED ORDER — ONDANSETRON HCL 4 MG PO TABS
4.0000 mg | ORAL_TABLET | Freq: Three times a day (TID) | ORAL | 0 refills | Status: DC | PRN
  Filled 2022-08-17: qty 12, 4d supply, fill #0

## 2022-08-17 MED ORDER — ONDANSETRON HCL 4 MG/2ML IJ SOLN
4.0000 mg | Freq: Once | INTRAMUSCULAR | Status: AC
  Administered 2022-08-17: 4 mg via INTRAVENOUS
  Filled 2022-08-17: qty 2

## 2022-08-17 MED ORDER — SODIUM CHLORIDE 0.9 % IV BOLUS
1000.0000 mL | Freq: Once | INTRAVENOUS | Status: AC
  Administered 2022-08-17: 1000 mL via INTRAVENOUS

## 2022-08-17 NOTE — Discharge Instructions (Signed)
It was a pleasure taking care of you today!   Your labs were without concerning findings. Your COVID and flu swabs were negative today. Ensure to maintain fluid intake. Also for the next couple of days, consume bland, easy to digest foods in small amounts as you are able.  This please include bananas, applesauce, rice, low-fat (lean) meats, toast, crackers.  You will be sent a prescription for Zofran, take as needed for nausea or vomiting.  You may follow-up with your primary care provider as needed.  Return to the emergency department if you are experiencing increasing/worsening symptoms.

## 2022-08-17 NOTE — Telephone Encounter (Signed)
Patient called office and the little diarrhea started on 08/08/22. Advised patient to go to ER to be evaluated due to the vomiting, which started this morning and along with the diarrhea after taking max dose of Immodium.  She will proceed to the [facility] ER.

## 2022-08-17 NOTE — ED Provider Notes (Signed)
[facility] EMERGENCY DEPARTMENT Provider Note   CSN: [id number] Arrival date & time: 08/17/22  1245     History  Chief Complaint  Patient presents with   Diarrhea   Emesis    Angie Liu is a 76 y.o. female who presents emergency department with concerns for vomiting and diarrhea onset 3 AM.  Notes that her diarrhea has been intermittent, watery and soft stools that have been nonbloody.  Denies sick contacts.  No emesis since 3 AM.  Patient notes that she was able to eat dinner last night without issues.  Notes that she had similar symptoms intermittently since 08/10/2022.  Has tried over-the-counter Imodium at home relief of her symptoms.  Denies fever, abdominal pain, rhinorrhea, nasal congestion.  The history is provided by the patient. No language interpreter was used.       Home Medications Prior to Admission medications   Medication Sig Start Date End Date Taking? Authorizing Provider  ondansetron (ZOFRAN) 4 MG tablet Take 1 tablet (4 mg total) by mouth every 8 (eight) hours as needed for nausea or vomiting. 08/17/22  Yes Jamilla Galli A, PA-C  alendronate (FOSAMAX) 70 MG tablet Take 1 tablet (70 mg total) by mouth every 7 (seven) days. Take with a full glass of water on an empty stomach. 07/18/22   Copland, Gay Filler, MD  ALPRAZolam Duanne Moron) 0.5 MG tablet Take 1 tablet (0.5 mg total) by mouth 2 (two) times daily as needed for anxiety. 08/18/22   Thayer Headings, PMHNP  b complex vitamins capsule Take 1 capsule by mouth daily.    [provider]  cephALEXin (KEFLEX) 500 MG capsule Take 1 capsule (500 mg total) by mouth 2 (two) times daily. 07/19/22   Copland, Gay Filler, MD  cholecalciferol (VITAMIN D) 25 MCG (1000 UNIT) tablet SMARTSIG:1 By Mouth 12/30/21   [provider]  doxepin (SINEQUAN) 100 MG capsule Take 1 capsule (100 mg total) by mouth at bedtime. 06/22/22 09/20/22  Thayer Headings, PMHNP  Multiple Vitamins-Minerals (PRESERVISION AREDS 2 PO)  Take by mouth.    [provider]  OVER THE COUNTER MEDICATION 4 capsules daily. Calm CP, takes 1 tab TID    [provider]  OVER THE COUNTER MEDICATION Alpha gabba    [provider]  Prenatal Vit-Fe Fumarate-FA (PRENATAL VITAMINS PO) Take by mouth.    [provider]  progesterone (PROMETRIUM) 100 MG capsule TAKE ONE CAPSULE BY MOUTH ONE TIME DAILY 07/06/22   Copland, Gay Filler, MD  QUERCETIN PO Take by mouth as needed.    [provider]  [facility] injection  09/14/21   [provider]  traMADol (ULTRAM) 50 MG tablet Take 50 mg by mouth every 6 (six) hours as needed. 04/15/21   [provider]  triamcinolone cream (KENALOG) 0.1 % Apply 1 application  topically 4 (four) times daily. 12/30/21   [provider]  valACYclovir (VALTREX) 1000 MG tablet Take 1,000 mg by mouth 3 (three) times daily. 12/30/21   [provider]  venlafaxine XR (EFFEXOR XR) 37.5 MG 24 hr capsule Take 1 capsule (37.5 mg total) by mouth daily with breakfast. 06/22/22 09/20/22  Thayer Headings, PMHNP  zolpidem (AMBIEN CR) 6.25 MG CR tablet Take 1 tablet (6.25 mg total) by mouth at bedtime as needed. for sleep 06/22/22   Thayer Headings, PMHNP      Allergies    Dust mite extract and Latex    Review of Systems   Review of Systems  Constitutional:  Negative for fever.  HENT:  Negative for congestion and rhinorrhea.   Gastrointestinal:  Positive for diarrhea and vomiting. Negative for abdominal pain.  All other systems reviewed and are negative.   Physical Exam Updated Vital Signs BP 104/76 (BP Location: Right Arm)   Pulse 89   Temp 98.2 F (36.8 C) (Oral)   Resp 20   Ht '5\' 6"'$  (1.676 m)   Wt 70.3 kg   SpO2 97%   BMI 25.02 kg/m  Physical Exam Vitals and nursing note reviewed.  Constitutional:      General: She is not in acute distress.    Appearance: She is not diaphoretic.  HENT:     Head: Normocephalic and atraumatic.      Mouth/Throat:     Pharynx: No oropharyngeal exudate.  Eyes:     General: No scleral icterus.    Conjunctiva/sclera: Conjunctivae normal.  Cardiovascular:     Rate and Rhythm: Normal rate and regular rhythm.     Pulses: Normal pulses.     Heart sounds: Normal heart sounds.  Pulmonary:     Effort: Pulmonary effort is normal. No respiratory distress.     Breath sounds: Normal breath sounds. No wheezing.  Abdominal:     General: Bowel sounds are normal.     Palpations: Abdomen is soft. There is no mass.     Tenderness: There is no abdominal tenderness. There is no guarding or rebound.     Comments: Pt ticklish during abdominal exam without TTP noted to the area.   Musculoskeletal:        General: Normal range of motion.     Cervical back: Normal range of motion and neck supple.  Skin:    General: Skin is warm and dry.  Neurological:     Mental Status: She is alert.  Psychiatric:        Behavior: Behavior normal.     ED Results / Procedures / Treatments   Labs (all labs ordered are listed, but only abnormal results are displayed) Labs Reviewed  BASIC METABOLIC PANEL - Abnormal; Notable for the following components:      Result Value   Potassium 3.4 (*)    Calcium 8.3 (*)    All other components within normal limits  CBC WITH DIFFERENTIAL/PLATELET - Abnormal; Notable for the following components:   Eosinophils Absolute 0.7 (*)    All other components within normal limits  RESP PANEL BY RT-PCR (FLU A&B, COVID) ARPGX2  LIPASE, BLOOD  URINALYSIS, ROUTINE W REFLEX MICROSCOPIC    EKG None  Radiology No results found.  Procedures Procedures     Medications Ordered in ED Medications  sodium chloride 0.9 % bolus 1,000 mL (0 mLs Intravenous Stopped 08/17/22 1536)  ondansetron (ZOFRAN) injection 4 mg (4 mg Intravenous Given 08/17/22 1405)    ED Course/ Medical Decision Making/ A&P Clinical Course as of 08/17/22 1540  Tue Aug 17, 2022  1502 Pt re-evaluated and noted  improvement of symptoms with treatment regimen in the ED. [SB]  1531 Shared decision making with patient and husband at bedside regarding CT scan versus discharge home with primary care follow-up.  At this time patient notes that she does not feel that she needs a CT scan at this time due to her symptoms improvement with treatment regimen in the ED and not having any abdominal pain at this time.  Patient notes that she will follow-up in the emergency department should her symptoms worsen with abdominal pain, nausea, vomiting, diarrhea.  Discussed discharge treatment plan with patient and husband at bedside.  Patient appears safe for discharge at this time. [SB]    Clinical Course User Index [SB] Anh Mangano A, PA-C                           Medical Decision Making Amount and/or Complexity of Data Reviewed Labs: ordered.  Risk Prescription drug management.   Pt presents with an episode of emesis as well as diarrhea onset 3 AM this morning.  History of similar symptoms intermittently since 08/10/2022.  Patient was able to eat dinner last night.  Has tried Imodium with relief of her symptoms. Vital signs, patient afebrile. On exam, pt with no abdominal tenderness to palpation noted on exam.  During abdominal exam, patient notes that she is ticklish without pain to the area.  No acute cardiovascular respiratory exam findings.  Differential diagnosis includes COVID, flu, gastroenteritis, diverticulitis.   Labs:  I ordered, and personally interpreted labs.  The pertinent results include:   Lipase unremarkable CBC without leukocytosis and unremarkable BMP with slightly decreased potassium at 3.4 otherwise unremarkable COVID swab negative Flu swab negative   Medications:  I ordered medication including IVF and Zofran for symptom management Reevaluation of the patient after these medicines and interventions, I reevaluated the patient and found that they have improved I have reviewed the  patients home medicines and have made adjustments as needed   Disposition: Presentation suspicious for viral etiology, likely gastroenteritis as cause of patient's symptoms today.  Doubt COVID, flu.  Doubt pancreatitis or diverticulitis at this time, patient asymptomatic without abdominal pain or tenderness noted on exam. After consideration of the diagnostic results and the patients response to treatment, I feel that the patient would benefit from Discharge home.  Patient provided with prescription for Zofran and instructed to follow-up with her primary care provider as needed.  Patient given strict return precautions to return to the emergency department should her symptoms worsen for further evaluation.  Supportive care measures and strict return precautions discussed with patient at bedside. Pt acknowledges and verbalizes understanding. Pt appears safe for discharge. Follow up as indicated in discharge paperwork.    This chart was dictated using voice recognition software, Dragon. Despite the best efforts of this provider to proofread and correct errors, errors may still occur which can change documentation meaning.   Final Clinical Impression(s) / ED Diagnoses Final diagnoses:  Nausea vomiting and diarrhea    Rx / DC Orders ED Discharge Orders          Ordered    ondansetron (ZOFRAN) 4 MG tablet  Every 8 hours PRN        08/17/22 1534              Yaelis Scharfenberg A, PA-C 08/17/22 1540    Tegeler, Gwenyth Allegra, MD 08/20/22 1736

## 2022-08-17 NOTE — ED Triage Notes (Signed)
Patient presents C/O N/V/D X since October 8th has worsened over past few days. Has been taking OTC meds with no relief (Imodium). Denies sick contacts.

## 2022-08-31 DIAGNOSIS — R531 Weakness: Secondary | ICD-10-CM | POA: Diagnosis not present

## 2022-08-31 DIAGNOSIS — R262 Difficulty in walking, not elsewhere classified: Secondary | ICD-10-CM | POA: Diagnosis not present

## 2022-08-31 DIAGNOSIS — M25561 Pain in right knee: Secondary | ICD-10-CM | POA: Diagnosis not present

## 2022-08-31 DIAGNOSIS — M25661 Stiffness of right knee, not elsewhere classified: Secondary | ICD-10-CM | POA: Diagnosis not present

## 2022-09-02 DIAGNOSIS — M25661 Stiffness of right knee, not elsewhere classified: Secondary | ICD-10-CM | POA: Diagnosis not present

## 2022-09-02 DIAGNOSIS — R531 Weakness: Secondary | ICD-10-CM | POA: Diagnosis not present

## 2022-09-02 DIAGNOSIS — M25561 Pain in right knee: Secondary | ICD-10-CM | POA: Diagnosis not present

## 2022-09-02 DIAGNOSIS — R262 Difficulty in walking, not elsewhere classified: Secondary | ICD-10-CM | POA: Diagnosis not present

## 2022-09-06 DIAGNOSIS — M25661 Stiffness of right knee, not elsewhere classified: Secondary | ICD-10-CM | POA: Diagnosis not present

## 2022-09-06 DIAGNOSIS — R262 Difficulty in walking, not elsewhere classified: Secondary | ICD-10-CM | POA: Diagnosis not present

## 2022-09-06 DIAGNOSIS — M25561 Pain in right knee: Secondary | ICD-10-CM | POA: Diagnosis not present

## 2022-09-06 DIAGNOSIS — R531 Weakness: Secondary | ICD-10-CM | POA: Diagnosis not present

## 2022-09-09 DIAGNOSIS — R531 Weakness: Secondary | ICD-10-CM | POA: Diagnosis not present

## 2022-09-09 DIAGNOSIS — M25661 Stiffness of right knee, not elsewhere classified: Secondary | ICD-10-CM | POA: Diagnosis not present

## 2022-09-09 DIAGNOSIS — R262 Difficulty in walking, not elsewhere classified: Secondary | ICD-10-CM | POA: Diagnosis not present

## 2022-09-09 DIAGNOSIS — M25561 Pain in right knee: Secondary | ICD-10-CM | POA: Diagnosis not present

## 2022-09-13 DIAGNOSIS — R531 Weakness: Secondary | ICD-10-CM | POA: Diagnosis not present

## 2022-09-13 DIAGNOSIS — M25561 Pain in right knee: Secondary | ICD-10-CM | POA: Diagnosis not present

## 2022-09-13 DIAGNOSIS — R262 Difficulty in walking, not elsewhere classified: Secondary | ICD-10-CM | POA: Diagnosis not present

## 2022-09-13 DIAGNOSIS — M25661 Stiffness of right knee, not elsewhere classified: Secondary | ICD-10-CM | POA: Diagnosis not present

## 2022-09-14 ENCOUNTER — Encounter (INDEPENDENT_AMBULATORY_CARE_PROVIDER_SITE_OTHER): Payer: PPO | Admitting: Ophthalmology

## 2022-09-14 DIAGNOSIS — H35721 Serous detachment of retinal pigment epithelium, right eye: Secondary | ICD-10-CM | POA: Diagnosis not present

## 2022-09-14 DIAGNOSIS — H353231 Exudative age-related macular degeneration, bilateral, with active choroidal neovascularization: Secondary | ICD-10-CM | POA: Diagnosis not present

## 2022-09-14 DIAGNOSIS — H35722 Serous detachment of retinal pigment epithelium, left eye: Secondary | ICD-10-CM | POA: Diagnosis not present

## 2022-09-14 DIAGNOSIS — H353132 Nonexudative age-related macular degeneration, bilateral, intermediate dry stage: Secondary | ICD-10-CM | POA: Diagnosis not present

## 2022-09-14 DIAGNOSIS — H43812 Vitreous degeneration, left eye: Secondary | ICD-10-CM | POA: Diagnosis not present

## 2022-09-16 DIAGNOSIS — M25561 Pain in right knee: Secondary | ICD-10-CM | POA: Diagnosis not present

## 2022-09-16 DIAGNOSIS — R262 Difficulty in walking, not elsewhere classified: Secondary | ICD-10-CM | POA: Diagnosis not present

## 2022-09-16 DIAGNOSIS — M25661 Stiffness of right knee, not elsewhere classified: Secondary | ICD-10-CM | POA: Diagnosis not present

## 2022-09-16 DIAGNOSIS — R531 Weakness: Secondary | ICD-10-CM | POA: Diagnosis not present

## 2022-09-21 ENCOUNTER — Encounter: Payer: Self-pay | Admitting: Psychiatry

## 2022-09-21 ENCOUNTER — Ambulatory Visit: Payer: PPO | Admitting: Psychiatry

## 2022-09-21 DIAGNOSIS — R531 Weakness: Secondary | ICD-10-CM | POA: Diagnosis not present

## 2022-09-21 DIAGNOSIS — R262 Difficulty in walking, not elsewhere classified: Secondary | ICD-10-CM | POA: Diagnosis not present

## 2022-09-21 DIAGNOSIS — F411 Generalized anxiety disorder: Secondary | ICD-10-CM | POA: Diagnosis not present

## 2022-09-21 DIAGNOSIS — F3342 Major depressive disorder, recurrent, in full remission: Secondary | ICD-10-CM | POA: Diagnosis not present

## 2022-09-21 DIAGNOSIS — M25661 Stiffness of right knee, not elsewhere classified: Secondary | ICD-10-CM | POA: Diagnosis not present

## 2022-09-21 DIAGNOSIS — F5101 Primary insomnia: Secondary | ICD-10-CM | POA: Diagnosis not present

## 2022-09-21 DIAGNOSIS — M25561 Pain in right knee: Secondary | ICD-10-CM | POA: Diagnosis not present

## 2022-09-21 MED ORDER — BELSOMRA 20 MG PO TABS: 20.0000 mg | ORAL_TABLET | Freq: Every day | ORAL | 0 refills | Status: DC

## 2022-09-21 MED ORDER — BELSOMRA 10 MG PO TABS: 10.0000 mg | ORAL_TABLET | Freq: Every day | ORAL | 0 refills | Status: DC

## 2022-09-21 MED ORDER — DOXEPIN HCL 100 MG PO CAPS: 100.0000 mg | ORAL_CAPSULE | Freq: Every day | ORAL | 0 refills | Status: DC

## 2022-09-21 MED ORDER — VENLAFAXINE HCL ER 37.5 MG PO CP24: 37.5000 mg | ORAL_CAPSULE | Freq: Every day | ORAL | 0 refills | Status: DC

## 2022-09-21 MED ORDER — ZOLPIDEM TARTRATE ER 6.25 MG PO TBCR: 6.2500 mg | EXTENDED_RELEASE_TABLET | Freq: Every evening | ORAL | 0 refills | Status: DC | PRN

## 2022-09-21 MED ORDER — BELSOMRA 15 MG PO TABS: 15.0000 mg | ORAL_TABLET | Freq: Every day | ORAL | 0 refills | Status: DC

## 2022-09-21 NOTE — Progress Notes (Signed)
Angie Liu [id number] [birth year] 76 y.o.  Subjective:   Patient ID:  Angie Liu is a 76 y.o. (DOB [birth year]) female.  Chief Complaint:  Chief Complaint  Patient presents with   Insomnia   Follow-up    Anxiety and depression    Insomnia   Angie Liu presents to the office today for follow-up of insomnia, anxiety, and depression.   She reports that she has been taking CBD + Melatonin gummies some nights.  She reports that she has been taking Ambien 6.25 mg at bedtime and this has been "fine." She reports that she occ will notice anxious thoughts before bed and thinking about if she will sleep ok or not. Sometimes she has difficulty falling asleep when this occurs.   She reports that she has had "some" depression. She noticed she was not looking forward to some things she normally would look forward to because of some additional challenges. She reports that sadness is mostly situational and in response to anxiety. She reports some anxiety in response to piano playing at church and does not want to "get lost" while playing. She also has anxiety in response to vertigo. She reports that her energy is lower than it has been in the past. She reports that her concentration is "sometimes better than others." Appetite was decreased with GI virus. She reports that she has been craving chocolate. Denies SI. "Even when I am depressed, I am grateful for my life."   She has been going to the gym less since loud music and microphone in [employer] was exacerbating tinnitus. She has been going to a yoga class twice a week and enjoys this. She has considered resuming strength training.   She reports that she recently had a GI virus. She had worsening tinnitus and vertigo.   Taking Xanax 0.5 mg every night before bedtime.   Past Psychotropic Medication Trials: Lyrica Alprazolam  Doxepin- Has been ineffective at 25 mg. Noticed some slight improvement in sleep initiation and maintenance with  increase to 50 mg po QHS. Started 25 mg on 09/26/17.  Hydroxyzine- Ineffective Xanax Ambien CR- Has been most helpful Ambien Sonata Rozerem- Ineffective Trileptal- Took for several years Remeron Trazodone Bupropion Lexapro- Helpful many years ago but did not respond more recently. Wellbutrin- Tremor Luvox- Took 50 mg po qd Prozac- took briefly and did not respond well Zoloft- "remember I didn't like it." Cymbalta- May have caused tremors Has not taken Celexa, Buspar, Effexor Abilify, Lamictal, Seroquel, Olanzapine    PHQ2-9    Flowsheet Row Clinical Support from 03/05/2022 in [facility] [place] Visit from 01/21/2022 in [facility] [place] Visit from 03/06/2015 in Primary Care at [facility] Total Score 0 0 0  PHQ-9 Total Score 0 0 --      Flowsheet Row ED from 08/17/2022 in [facility] No Risk        Review of Systems:  Review of Systems  HENT:  Positive for tinnitus.   Musculoskeletal:  Negative for gait problem.  Neurological:        Vertigo  Psychiatric/Behavioral:  The patient has insomnia.        Please refer to HPI    Medications: I have reviewed the patient's current medications.  Current Outpatient Medications  Medication Sig Dispense Refill   ALPRAZolam (XANAX) 0.5 MG tablet Take 1 tablet (0.5 mg total) by mouth 2 (  two) times daily as needed for anxiety. 180 tablet 0   b complex vitamins capsule Take 1 capsule by mouth daily.     cholecalciferol (VITAMIN D) 25 MCG (1000 UNIT) tablet SMARTSIG:1 By Mouth     Multiple Vitamins-Minerals (PRESERVISION AREDS 2 PO) Take by mouth.     OVER THE COUNTER MEDICATION 4 capsules daily. Calm CP, takes 1 tab TID     OVER THE COUNTER MEDICATION Alpha gabba     Prenatal Vit-Fe Fumarate-FA (PRENATAL VITAMINS PO) Take by mouth.     progesterone (PROMETRIUM) 100 MG capsule TAKE ONE CAPSULE BY  MOUTH ONE TIME DAILY 90 capsule 0   Suvorexant (BELSOMRA) 10 MG TABS Take 10 mg by mouth at bedtime. 9 tablet 0   [START ON 09/30/2022] Suvorexant (BELSOMRA) 15 MG TABS Take 15 mg by mouth at bedtime. 10 tablet 0   [START ON 10/09/2022] Suvorexant (BELSOMRA) 20 MG TABS Take 20 mg by mouth at bedtime for 10 days. 10 tablet 0   alendronate (FOSAMAX) 70 MG tablet Take 1 tablet (70 mg total) by mouth every 7 (seven) days. Take with a full glass of water on an empty stomach. (Patient not taking: Reported on 09/21/2022) 12 tablet 3   cephALEXin (KEFLEX) 500 MG capsule Take 1 capsule (500 mg total) by mouth 2 (two) times daily. (Patient not taking: Reported on 09/21/2022) 14 capsule 0   doxepin (SINEQUAN) 100 MG capsule Take 1 capsule (100 mg total) by mouth at bedtime. 90 capsule 0   ondansetron (ZOFRAN) 4 MG tablet Take 1 tablet (4 mg total) by mouth every 8 (eight) hours as needed for nausea or vomiting. 12 tablet 0   QUERCETIN PO Take by mouth as needed.     SHINGRIX injection      traMADol (ULTRAM) 50 MG tablet Take 50 mg by mouth every 6 (six) hours as needed. (Patient not taking: Reported on 09/21/2022)     triamcinolone cream (KENALOG) 0.1 % Apply 1 application  topically 4 (four) times daily.     valACYclovir (VALTREX) 1000 MG tablet Take 1,000 mg by mouth 3 (three) times daily. (Patient not taking: Reported on 09/21/2022)     venlafaxine XR (EFFEXOR XR) 37.5 MG 24 hr capsule Take 1 capsule (37.5 mg total) by mouth daily with breakfast. 90 capsule 0   zolpidem (AMBIEN CR) 6.25 MG CR tablet Take 1 tablet (6.25 mg total) by mouth at bedtime as needed. for sleep 30 tablet 0   No current facility-administered medications for this visit.    Medication Side Effects: Other: Possible vertigo  Allergies:  Allergies  Allergen Reactions   Dust Mite Extract    Latex Rash    Past Medical History:  Diagnosis Date   Anxiety    Depression    Hayfever     Past Medical History, Surgical history,  Social history, and Family history were reviewed and updated as appropriate.   Please see review of systems for further details on the patient's review from today.   Objective:   Physical Exam:  There were no vitals taken for this visit.  Physical Exam Constitutional:      General: She is not in acute distress. Musculoskeletal:        General: No deformity.  Neurological:     Mental Status: She is alert and oriented to person, place, and time.     Coordination: Coordination normal.  Psychiatric:        Attention and Perception: Attention and perception normal. She  does not perceive auditory or visual hallucinations.        Mood and Affect: Mood normal. Mood is not anxious or depressed. Affect is not labile, blunt, angry or inappropriate.        Speech: Speech normal.        Behavior: Behavior normal.        Thought Content: Thought content normal. Thought content is not paranoid or delusional. Thought content does not include homicidal or suicidal ideation. Thought content does not include homicidal or suicidal plan.        Cognition and Memory: Cognition and memory normal.        Judgment: Judgment normal.     Comments: Insight intact     Lab Review:     Component Value Date/Time   NA 139 08/17/2022 1303   NA 143 05/25/2021 1517   K 3.4 (L) 08/17/2022 1303   CL 106 08/17/2022 1303   CO2 24 08/17/2022 1303   GLUCOSE 87 08/17/2022 1303   BUN 10 08/17/2022 1303   BUN 16 05/25/2021 1517   CREATININE 0.56 08/17/2022 1303   CALCIUM 8.3 (L) 08/17/2022 1303   PROT 6.0 07/19/2022 1025   PROT 6.7 05/25/2021 1517   ALBUMIN 4.0 07/19/2022 1025   ALBUMIN 4.8 (H) 05/25/2021 1517   AST 20 07/19/2022 1025   ALT 17 07/19/2022 1025   ALKPHOS 88 07/19/2022 1025   BILITOT 0.5 07/19/2022 1025   BILITOT 0.2 05/25/2021 1517   GFRNONAA >60 08/17/2022 1303       Component Value Date/Time   WBC 6.1 08/17/2022 1303   RBC 4.31 08/17/2022 1303   HGB 14.2 08/17/2022 1303   HGB 13.6  05/25/2021 1517   HCT 41.3 08/17/2022 1303   HCT 39.5 05/25/2021 1517   PLT 232 08/17/2022 1303   PLT 244 05/25/2021 1517   MCV 95.8 08/17/2022 1303   MCV 99 (H) 05/25/2021 1517   MCH 32.9 08/17/2022 1303   MCHC 34.4 08/17/2022 1303   RDW 13.2 08/17/2022 1303   RDW 14.6 05/25/2021 1517   LYMPHSABS 1.4 08/17/2022 1303   LYMPHSABS 0.9 05/25/2021 1517   MONOABS 0.5 08/17/2022 1303   EOSABS 0.7 (H) 08/17/2022 1303   EOSABS 0.1 05/25/2021 1517   BASOSABS 0.0 08/17/2022 1303   BASOSABS 0.0 05/25/2021 1517    No results found for: "POCLITH", "LITHIUM"   No results found for: "PHENYTOIN", "PHENOBARB", "VALPROATE", "CBMZ"   .res Assessment: Plan:    Pt seen for 40 minutes and time spent discussing her request to possible reduce medications to minimize risk of vertigo. Discussed that she is currently on the lowest dose of Ambien CR. Discussed options would be to switch to Belsomra or to Ambien IR 5 mg 1/2 tab po QHS. Discussed potential benefits, risks, and and side effects of Belsomra for insomnia.  Patient agrees to trial of Belsomra.  Patient provided with Belsomra 15 mg samples and advised to take 1 tablet by mouth at bedtime for 9 days, and to increase to 20 mg tabs if 15 mg dose is ineffective and not experiencing any side effects.  Patient advised to contact office if Belsomra is effective and to request a script for the dose that is effective. She reports that she would like to have Ambien CR 6.25 mg po QHS prn available if she is unable to sleep with Belsomra and to reduce anxiety around insomnia. Pt advised to contact office with update regarding response to Belsomra and to request a script if Belsomra  is helpful for her insomnia.  Continue Alprazolam 0.5 mg po BID prn anxiety.  Continue Doxepin 100 mg po QHS for insomnia and depression.  Continue Effexor XR 37.5 mg po qd for anxiety and depression.  Pt to follow-up with this provider in 8 weeks or sooner if clinically indicated.   Patient advised to contact office with any questions, adverse effects, or acute worsening in signs and symptoms.    Angie Liu was seen today for insomnia and follow-up.  Diagnoses and all orders for this visit:  Primary insomnia -     zolpidem (AMBIEN CR) 6.25 MG CR tablet; Take 1 tablet (6.25 mg total) by mouth at bedtime as needed. for sleep -     Suvorexant (BELSOMRA) 15 MG TABS; Take 15 mg by mouth at bedtime. -     Suvorexant (BELSOMRA) 20 MG TABS; Take 20 mg by mouth at bedtime for 10 days. -     Suvorexant (BELSOMRA) 10 MG TABS; Take 10 mg by mouth at bedtime. -     doxepin (SINEQUAN) 100 MG capsule; Take 1 capsule (100 mg total) by mouth at bedtime.  Recurrent major depressive disorder, in full remission ([place]) -     doxepin (SINEQUAN) 100 MG capsule; Take 1 capsule (100 mg total) by mouth at bedtime. -     venlafaxine XR (EFFEXOR XR) 37.5 MG 24 hr capsule; Take 1 capsule (37.5 mg total) by mouth daily with breakfast.  Generalized anxiety disorder -     venlafaxine XR (EFFEXOR XR) 37.5 MG 24 hr capsule; Take 1 capsule (37.5 mg total) by mouth daily with breakfast.     Please see After Visit Summary for patient specific instructions.  Future Appointments  Date Time Provider [place]  11/17/2022 10:30 AM Thayer Headings, PMHNP [facility] None  03/08/2023  8:20 AM [facility]    No orders of the defined types were placed in this encounter.   -------------------------------

## 2022-09-21 NOTE — Patient Instructions (Signed)
Please start Belsomra 10 mg at bedtime for 9 nights, then increase to 15 mg at bedtime for 9 nights if tolerating and not fully effective, then may increase to 20 mg at bedtime if 15 mg is not fully effective.   Please call office to let us know how the trial went and if you would like a prescription sent to your pharmacy.

## 2022-09-24 DIAGNOSIS — R262 Difficulty in walking, not elsewhere classified: Secondary | ICD-10-CM | POA: Diagnosis not present

## 2022-09-24 DIAGNOSIS — M25661 Stiffness of right knee, not elsewhere classified: Secondary | ICD-10-CM | POA: Diagnosis not present

## 2022-09-24 DIAGNOSIS — M25561 Pain in right knee: Secondary | ICD-10-CM | POA: Diagnosis not present

## 2022-09-24 DIAGNOSIS — R531 Weakness: Secondary | ICD-10-CM | POA: Diagnosis not present

## 2022-09-27 DIAGNOSIS — R531 Weakness: Secondary | ICD-10-CM | POA: Diagnosis not present

## 2022-09-27 DIAGNOSIS — M25561 Pain in right knee: Secondary | ICD-10-CM | POA: Diagnosis not present

## 2022-09-27 DIAGNOSIS — M25661 Stiffness of right knee, not elsewhere classified: Secondary | ICD-10-CM | POA: Diagnosis not present

## 2022-09-27 DIAGNOSIS — R262 Difficulty in walking, not elsewhere classified: Secondary | ICD-10-CM | POA: Diagnosis not present

## 2022-09-29 DIAGNOSIS — R531 Weakness: Secondary | ICD-10-CM | POA: Diagnosis not present

## 2022-09-29 DIAGNOSIS — M25561 Pain in right knee: Secondary | ICD-10-CM | POA: Diagnosis not present

## 2022-09-29 DIAGNOSIS — M25661 Stiffness of right knee, not elsewhere classified: Secondary | ICD-10-CM | POA: Diagnosis not present

## 2022-09-29 DIAGNOSIS — R262 Difficulty in walking, not elsewhere classified: Secondary | ICD-10-CM | POA: Diagnosis not present

## 2022-10-04 DIAGNOSIS — M25561 Pain in right knee: Secondary | ICD-10-CM | POA: Diagnosis not present

## 2022-10-04 DIAGNOSIS — M25661 Stiffness of right knee, not elsewhere classified: Secondary | ICD-10-CM | POA: Diagnosis not present

## 2022-10-04 DIAGNOSIS — R262 Difficulty in walking, not elsewhere classified: Secondary | ICD-10-CM | POA: Diagnosis not present

## 2022-10-04 DIAGNOSIS — R531 Weakness: Secondary | ICD-10-CM | POA: Diagnosis not present

## 2022-10-06 DIAGNOSIS — R262 Difficulty in walking, not elsewhere classified: Secondary | ICD-10-CM | POA: Diagnosis not present

## 2022-10-06 DIAGNOSIS — R531 Weakness: Secondary | ICD-10-CM | POA: Diagnosis not present

## 2022-10-06 DIAGNOSIS — M25561 Pain in right knee: Secondary | ICD-10-CM | POA: Diagnosis not present

## 2022-10-06 DIAGNOSIS — M25661 Stiffness of right knee, not elsewhere classified: Secondary | ICD-10-CM | POA: Diagnosis not present

## 2022-10-11 ENCOUNTER — Other Ambulatory Visit: Payer: Self-pay | Admitting: Family Medicine

## 2022-10-11 DIAGNOSIS — R531 Weakness: Secondary | ICD-10-CM | POA: Diagnosis not present

## 2022-10-11 DIAGNOSIS — M25561 Pain in right knee: Secondary | ICD-10-CM | POA: Diagnosis not present

## 2022-10-11 DIAGNOSIS — M25661 Stiffness of right knee, not elsewhere classified: Secondary | ICD-10-CM | POA: Diagnosis not present

## 2022-10-11 DIAGNOSIS — R262 Difficulty in walking, not elsewhere classified: Secondary | ICD-10-CM | POA: Diagnosis not present

## 2022-10-14 DIAGNOSIS — R531 Weakness: Secondary | ICD-10-CM | POA: Diagnosis not present

## 2022-10-14 DIAGNOSIS — R262 Difficulty in walking, not elsewhere classified: Secondary | ICD-10-CM | POA: Diagnosis not present

## 2022-10-14 DIAGNOSIS — M25561 Pain in right knee: Secondary | ICD-10-CM | POA: Diagnosis not present

## 2022-10-14 DIAGNOSIS — M25661 Stiffness of right knee, not elsewhere classified: Secondary | ICD-10-CM | POA: Diagnosis not present

## 2022-10-15 ENCOUNTER — Telehealth: Payer: Self-pay | Admitting: Psychiatry

## 2022-10-15 DIAGNOSIS — F5101 Primary insomnia: Secondary | ICD-10-CM

## 2022-10-15 MED ORDER — BELSOMRA 20 MG PO TABS: 20.0000 mg | ORAL_TABLET | Freq: Every day | ORAL | 2 refills | Status: DC

## 2022-10-15 NOTE — Telephone Encounter (Signed)
Pt called and said that the belsomra 20  mg is working. She would like a script sent to [facility] in [place]

## 2022-10-15 NOTE — Telephone Encounter (Signed)
Please review

## 2022-10-15 NOTE — Telephone Encounter (Signed)
Pt informed

## 2022-10-15 NOTE — Telephone Encounter (Signed)
Please let her know that a script has been sent.

## 2022-10-18 DIAGNOSIS — R531 Weakness: Secondary | ICD-10-CM | POA: Diagnosis not present

## 2022-10-18 DIAGNOSIS — M25561 Pain in right knee: Secondary | ICD-10-CM | POA: Diagnosis not present

## 2022-10-18 DIAGNOSIS — R262 Difficulty in walking, not elsewhere classified: Secondary | ICD-10-CM | POA: Diagnosis not present

## 2022-10-18 DIAGNOSIS — M25661 Stiffness of right knee, not elsewhere classified: Secondary | ICD-10-CM | POA: Diagnosis not present

## 2022-10-30 ENCOUNTER — Encounter: Payer: Self-pay | Admitting: Family Medicine

## 2022-11-01 ENCOUNTER — Telehealth: Payer: PPO | Admitting: Nurse Practitioner

## 2022-11-01 DIAGNOSIS — U071 COVID-19: Secondary | ICD-10-CM

## 2022-11-01 NOTE — Progress Notes (Signed)
Virtual Visit Consent   Angie Liu, you are scheduled for a virtual visit with a [facility] provider today. Just as with appointments in the office, your consent must be obtained to participate. Your consent will be active for this visit and any virtual visit you may have with one of our providers in the next 365 days. If you have a MyChart account, a copy of this consent can be sent to you electronically.  As this is a virtual visit, video technology does not allow for your provider to perform a traditional examination. This may limit your provider's ability to fully assess your condition. If your provider identifies any concerns that need to be evaluated in person or the need to arrange testing (such as labs, EKG, etc.), we will make arrangements to do so. Although advances in technology are sophisticated, we cannot ensure that it will always work on either your end or our end. If the connection with a video visit is poor, the visit may have to be switched to a telephone visit. With either a video or telephone visit, we are not always able to ensure that we have a secure connection.  By engaging in this virtual visit, you consent to the provision of healthcare and authorize for your insurance to be billed (if applicable) for the services provided during this visit. Depending on your insurance coverage, you may receive a charge related to this service.  I need to obtain your verbal consent now. Are you willing to proceed with your visit today? Otilia Kareem Gaffey has provided verbal consent on 11/01/2022 for a virtual visit (video or telephone). Apolonio Schneiders, FNP  Date: 11/01/2022 10:29 AM  Virtual Visit via Video Note   I, Apolonio Schneiders, connected with  Alizea Pell Ruffini  ([id number], 06-26-1946) on 11/01/22 at 10:30 AM EST by a video-enabled telemedicine application and verified that I am speaking with the correct person using two identifiers.  Location: Patient: Virtual Visit Location Patient:  Home Provider: Virtual Visit Location Provider: Home Office   I discussed the limitations of evaluation and management by telemedicine and the availability of in person appointments. The patient expressed understanding and agreed to proceed.    History of Present Illness: Angie Liu is a 77 y.o. who identifies as a female who was assigned female at birth, and is being seen today for COVID treatment. She started to have symptoms one week ago 10/25/22 and tested positive that day.  She took another COVID test today and is still positive.   Symptoms include: fatigue, cough (dry), denies chest tightness or SOB, nasal congestion. She has not had a fever.   She has been using over the counter cold medication for symptom relief. Overall feels her URI symptoms have improved but she has ongoing weakness and fatigue. Denies nausea, diarrhea or difficulty with urinating or BMs  She has had a good appetite, and has been staying hydrated.   She has not had COVID in the past that she knows of She has been vaccinated in the past without a recent booster, most recent was one year ago.   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   Exudative age-related macular degeneration of right eye with active choroidal neovascularization ([place]) 06/04/2020   Exudative age-related macular degeneration of left eye with active choroidal neovascularization ([place]) 02/07/2020   Serous detachment of retinal pigment epithelium of  left eye 02/07/2020   Posterior vitreous detachment of left eye 02/07/2020   Intermediate stage nonexudative age-related macular degeneration of left eye 02/07/2020   Intermediate stage nonexudative age-related macular degeneration of right eye 02/07/2020   GAD (generalized anxiety disorder) 11/28/2018   Recurrent major depression in partial remission ([place]) 11/28/2018   Chronic insomnia 02/02/2018   Depression 02/02/2018   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 Take with a full glass of water on an empty stomach. (Patient not taking: Reported on 09/21/2022), Disp: 12 tablet, Rfl: 3   ALPRAZolam (XANAX) 0.5 MG tablet, Take 1 tablet (0.5 mg total) by mouth 2 (two) times daily as needed for anxiety., Disp: 180 tablet, Rfl: 0   b complex vitamins capsule, Take 1 capsule by mouth daily., Disp: , Rfl:    cephALEXin (KEFLEX) 500 MG capsule, Take 1 capsule (500 mg total) by mouth 2 (two) times daily. (Patient not taking: Reported on 09/21/2022), Disp: 14 capsule, Rfl: 0   cholecalciferol (VITAMIN D) 25 MCG (1000 UNIT) tablet, SMARTSIG:1 By Mouth, Disp: , Rfl:    doxepin (SINEQUAN) 100 MG capsule, Take 1 capsule (100 mg total) by mouth at bedtime., Disp: 90 capsule, Rfl: 0   Multiple Vitamins-Minerals (PRESERVISION AREDS 2 PO), Take by mouth., Disp: , Rfl:    ondansetron (ZOFRAN) 4 MG tablet, Take 1 tablet (4 mg total) by mouth every 8 (eight) hours as needed for nausea or vomiting., Disp: 12 tablet, Rfl: 0   OVER THE COUNTER MEDICATION, 4 capsules daily. Calm CP, takes 1  tab TID, Disp: , Rfl:    OVER THE COUNTER MEDICATION, Alpha gabba, Disp: , Rfl:    Prenatal Vit-Fe Fumarate-FA (PRENATAL VITAMINS PO), Take by mouth., Disp: , Rfl:    progesterone (PROMETRIUM) 100 MG capsule, Take 1 capsule (100 mg total) by mouth daily., Disp: 90 capsule, Rfl: 0   QUERCETIN PO, Take by mouth as needed., Disp: , Rfl:    SHINGRIX injection, , Disp: , Rfl:    Suvorexant (BELSOMRA) 20 MG TABS, Take 20 mg by mouth at bedtime., Disp: 30 tablet, Rfl: 2   traMADol (ULTRAM) 50 MG tablet, Take 50 mg by mouth every 6 (six) hours as needed. (Patient not taking: Reported on 09/21/2022), Disp: , Rfl:    triamcinolone cream (KENALOG) 0.1 %, Apply 1 application  topically 4 (four) times daily., Disp: , Rfl:    valACYclovir (VALTREX) 1000 MG tablet, Take 1,000 mg by mouth 3 (three) times daily. (Patient not taking: Reported on 09/21/2022), Disp: , Rfl:    venlafaxine XR (EFFEXOR XR) 37.5 MG 24 hr capsule, Take 1 capsule (37.5 mg total) by mouth daily with breakfast., Disp:  90 capsule, Rfl: 0   zolpidem (AMBIEN CR) 6.25 MG CR tablet, Take 1 tablet (6.25 mg total) by mouth at bedtime as needed. for sleep, Disp: 30 tablet, Rfl: 0  Observations/Objective: Patient is well-developed, well-nourished in no acute distress.  Resting comfortably  at home.  Head is normocephalic, atraumatic.  No labored breathing.  Speech is clear and coherent with logical content.  Patient is alert and oriented at baseline.    Assessment and Plan: 1. COVID-19 Discussed ongoing fatigue without symptoms of secondary infection at this point.  Discussed increasing fluids and protein in diet  Rest and follow up for new or worsening symptoms      Follow Up Instructions: I discussed the assessment and treatment plan with the patient. The patient was provided an opportunity to ask questions and all were answered. The patient agreed with the plan and demonstrated an understanding of the instructions.  A copy of  instructions were sent to the patient via MyChart unless otherwise noted below.    The patient was advised to call back or seek an in-person evaluation if the symptoms worsen or if the condition fails to improve as anticipated.  Time:  I spent 10 minutes with the patient via telehealth technology discussing the above problems/concerns.    Apolonio Schneiders, FNP

## 2022-11-01 NOTE — Telephone Encounter (Signed)
Please see the MyChart message reply(ies) for my assessment and plan.  The patient gave consent for this Medical Advice Message and is aware that it may result in a bill to their insurance company as well as the possibility that this may result in a co-payment or deductible. They are an established patient, but are not seeking medical advice exclusively about a problem treated during an in person or video visit in the last 7 days. I did not recommend an in person or video visit within 7 days of my reply.  I spent a total of 8 minutes cumulative time within 7 days through [employer] Lamar Blinks, MD

## 2022-11-03 ENCOUNTER — Encounter: Payer: Self-pay | Admitting: Family Medicine

## 2022-11-17 ENCOUNTER — Ambulatory Visit: Payer: PPO | Admitting: Psychiatry

## 2022-11-24 ENCOUNTER — Ambulatory Visit: Payer: PPO | Admitting: Psychiatry

## 2022-11-25 DIAGNOSIS — H353231 Exudative age-related macular degeneration, bilateral, with active choroidal neovascularization: Secondary | ICD-10-CM | POA: Diagnosis not present

## 2022-11-25 DIAGNOSIS — H35721 Serous detachment of retinal pigment epithelium, right eye: Secondary | ICD-10-CM | POA: Diagnosis not present

## 2022-11-25 DIAGNOSIS — H353112 Nonexudative age-related macular degeneration, right eye, intermediate dry stage: Secondary | ICD-10-CM | POA: Diagnosis not present

## 2022-11-25 DIAGNOSIS — H353122 Nonexudative age-related macular degeneration, left eye, intermediate dry stage: Secondary | ICD-10-CM | POA: Diagnosis not present

## 2022-11-25 DIAGNOSIS — H35722 Serous detachment of retinal pigment epithelium, left eye: Secondary | ICD-10-CM | POA: Diagnosis not present

## 2022-11-25 DIAGNOSIS — H43812 Vitreous degeneration, left eye: Secondary | ICD-10-CM | POA: Diagnosis not present

## 2022-12-03 ENCOUNTER — Telehealth (INDEPENDENT_AMBULATORY_CARE_PROVIDER_SITE_OTHER): Payer: PPO | Admitting: Psychiatry

## 2022-12-03 ENCOUNTER — Encounter: Payer: Self-pay | Admitting: Psychiatry

## 2022-12-03 DIAGNOSIS — F5101 Primary insomnia: Secondary | ICD-10-CM

## 2022-12-03 DIAGNOSIS — F3342 Major depressive disorder, recurrent, in full remission: Secondary | ICD-10-CM | POA: Diagnosis not present

## 2022-12-03 DIAGNOSIS — F411 Generalized anxiety disorder: Secondary | ICD-10-CM

## 2022-12-03 MED ORDER — ZOLPIDEM TARTRATE ER 6.25 MG PO TBCR: 6.2500 mg | EXTENDED_RELEASE_TABLET | Freq: Every evening | ORAL | 1 refills | Status: DC | PRN

## 2022-12-03 MED ORDER — VENLAFAXINE HCL ER 37.5 MG PO CP24: 37.5000 mg | ORAL_CAPSULE | Freq: Every day | ORAL | 1 refills | Status: DC

## 2022-12-03 MED ORDER — ALPRAZOLAM 0.5 MG PO TABS: 0.5000 mg | ORAL_TABLET | Freq: Two times a day (BID) | ORAL | 1 refills | Status: DC | PRN

## 2022-12-03 MED ORDER — DOXEPIN HCL 100 MG PO CAPS: 100.0000 mg | ORAL_CAPSULE | Freq: Every day | ORAL | 1 refills | Status: DC

## 2022-12-03 NOTE — Progress Notes (Signed)
Angie Liu [id number] June 29, 1946 77 y.o.  Virtual Visit via Video Note  I connected with pt @ on 12/03/22 at  9:30 AM EST by a video enabled telemedicine application and verified that I am speaking with the correct person using two identifiers.   I discussed the limitations of evaluation and management by telemedicine and the availability of in person appointments. The patient expressed understanding and agreed to proceed.  I discussed the assessment and treatment plan with the patient. The patient was provided an opportunity to ask questions and all were answered. The patient agreed with the plan and demonstrated an understanding of the instructions.   The patient was advised to call back or seek an in-person evaluation if the symptoms worsen or if the condition fails to improve as anticipated.  I provided 30 minutes of non-face-to-face time during this encounter.  The patient was located at home.  The provider was located at home.   Thayer Headings, PMHNP   Subjective:   Patient ID:  Angie Liu is a 77 y.o. (DOB December 07, 1945) female.  Chief Complaint:  Chief Complaint  Patient presents with   Follow-up    Anxiety, depression, and insomnia    HPI Angie Liu presents for follow-up of anxiety, depression, and insomnia. She reports that she was taking Belsomra 20 mg and ran out of it and resumed Ambien CR 6.25 mg. She reports that she is "sleeping better" with Ambien CR 6.25 mg and having less middle of the night awakenings and is occasionally able to sleeping throughout the night without awakening. She reports sleeping 6.5-8 hours a night on average.   She reports that her wine use has increased since Christmas. She reports that she has been having 1-2 glasses of wine most nights and asks about drinking wine in combination with medications.    She reports occasional anxiety- "normal anxiety" in response to situational stressors, such as driving in traffic. Denies any panic  symptoms. She reports "a little" depression in anticipation of house painters coming and was able to work through this. She reports that she had some depression around having COVID at Christmas and not being able to play music. She reports that her energy and motivation have been "not good." She has been doing some house work, such as steam mopping. She reports that she has been going the gym some. She reports that her concentration has "generally been good." She reports that she is able to read and keep up with the news and enjoys this. Appetite has been "too good." She reports that she has been eating more cookies. Denies SI.   Having house painted and painters will be back all of next week. She reports that her piano is currently covered and she has not been able to play piano or practice to prepare to play music at church this Sunday.   She had COVID around Christmas and spent time after Christmas with family.  Family is doing well. Connects with daughter on the weekends.   Takes Alprazolam 0.5 mg at bedtime and an additional tab as needed for middle of the night awakenings. She reports that she was needing second Alprazolam prn more nights while taking Belsomra.  Ambien CR last filled 09/21/22. Alprazolam last filled 09/10/22 for 90 day supply.  Past Psychotropic Medication Trials: Lyrica Alprazolam  Doxepin- Has been ineffective at 25 mg. Noticed some slight improvement in sleep initiation and maintenance with increase to 50 mg po QHS. Started 25 mg on 09/26/17.  Hydroxyzine- Ineffective Xanax Ambien CR- Has been most helpful Ambien Sonata Rozerem- Ineffective Belsomra-partially effective.  Trileptal- Took for several years Remeron Trazodone Bupropion Lexapro- Helpful many years ago but did not respond more recently. Wellbutrin- Tremor Luvox- Took 50 mg po qd Prozac- took briefly and did not respond well Zoloft- "remember I didn't like it." Cymbalta- May have caused tremors Has  not taken Celexa, Buspar, Effexor Abilify, Lamictal, Seroquel, Olanzapine  Review of Systems:  Review of Systems  Respiratory:  Positive for cough.   Gastrointestinal: Negative.   Musculoskeletal:  Negative for gait problem.  Neurological:  Negative for tremors.  Psychiatric/Behavioral:         Please refer to HPI    Medications: I have reviewed the patient's current medications.  Current Outpatient Medications  Medication Sig Dispense Refill   b complex vitamins capsule Take 1 capsule by mouth daily.     Multiple Vitamins-Minerals (PRESERVISION AREDS 2 PO) Take by mouth.     OVER THE COUNTER MEDICATION 4 capsules daily. Calm CP, takes 1 tab TID     OVER THE COUNTER MEDICATION Alpha gabba     Prenatal Vit-Fe Fumarate-FA (PRENATAL VITAMINS PO) Take by mouth.     progesterone (PROMETRIUM) 100 MG capsule Take 1 capsule (100 mg total) by mouth daily. 90 capsule 0   QUERCETIN PO Take by mouth as needed.     alendronate (FOSAMAX) 70 MG tablet Take 1 tablet (70 mg total) by mouth every 7 (seven) days. Take with a full glass of water on an empty stomach. (Patient not taking: Reported on 09/21/2022) 12 tablet 3   ALPRAZolam (XANAX) 0.5 MG tablet Take 1 tablet (0.5 mg total) by mouth 2 (two) times daily as needed for anxiety. 180 tablet 1   doxepin (SINEQUAN) 100 MG capsule Take 1 capsule (100 mg total) by mouth at bedtime. 90 capsule 1   venlafaxine XR (EFFEXOR XR) 37.5 MG 24 hr capsule Take 1 capsule (37.5 mg total) by mouth daily with breakfast. 90 capsule 1   zolpidem (AMBIEN CR) 6.25 MG CR tablet Take 1 tablet (6.25 mg total) by mouth at bedtime as needed. 90 tablet 1   No current facility-administered medications for this visit.    Medication Side Effects: None  Allergies:  Allergies  Allergen Reactions   Dust Mite Extract    Latex Rash    Past Medical History:  Diagnosis Date   Anxiety    Depression    Hayfever     Family History  Problem Relation Age of Onset   Cancer  Mother        colon   Depression Mother    Diabetes Father    Depression Father    Depression Son    Depression Paternal Uncle     Social History   Socioeconomic History   Marital status: Married    Spouse name: [name]   Number of children: 2   Years of education: Not on file   Highest education level: Master's degree (e.g., MA, MS, MEng, MEd, MSW, MBA)  Occupational History   Not on file  Tobacco Use   Smoking status: Never   Smokeless tobacco: Never  Substance and Sexual Activity   Alcohol use: Yes    Alcohol/week: 2.0 standard drinks of alcohol    Types: 2 Glasses of wine per week    Comment: 2 glass a day   Drug use: No   Sexual activity: Not on file  Other Topics Concern   Not on  file  Social History Narrative   Live with husband   Right handed   Caffeine: 4-5 cups of coffee   Social Determinants of Health   Financial Resource Strain: Low Risk  (03/05/2022)   Overall Financial Resource Strain (CARDIA)    Difficulty of Paying Living Expenses: Not hard at all  Food Insecurity: No Food Insecurity (03/05/2022)   Hunger Vital Sign    Worried About Running Out of Food in the Last Year: Never true    Ran Out of Food in the Last Year: Never true  Transportation Needs: No Transportation Needs (03/05/2022)   PRAPARE - Hydrologist (Medical): No    Lack of Transportation (Non-Medical): No  Physical Activity: Insufficiently Active (03/05/2022)   Exercise Vital Sign    Days of Exercise per Week: 5 days    Minutes of Exercise per Session: 20 min  Stress: No Stress Concern Present (03/05/2022)   [place]    Feeling of Stress : Not at all  Social Connections: [place] (03/05/2022)   Social Connection and Isolation Panel [NHANES]    Frequency of Communication with Friends and Family: Three times a week    Frequency of Social Gatherings with Friends and Family: Three times a  week    Attends Religious Services: More than 4 times per year    Active Member of Clubs or Organizations: Yes    Attends Archivist Meetings: More than 4 times per year    Marital Status: Married  Human resources officer Violence: Not At Risk (03/05/2022)   Humiliation, Afraid, Rape, and Kick questionnaire    Fear of Current or Ex-Partner: No    Emotionally Abused: No    Physically Abused: No    Sexually Abused: No    Past Medical History, Surgical history, Social history, and Family history were reviewed and updated as appropriate.   Please see review of systems for further details on the patient's review from today.   Objective:   Physical Exam:  There were no vitals taken for this visit.  Physical Exam Neurological:     Mental Status: She is alert and oriented to person, place, and time.     Cranial Nerves: No dysarthria.  Psychiatric:        Attention and Perception: Attention and perception normal.        Mood and Affect: Mood normal.        Speech: Speech normal.        Behavior: Behavior is cooperative.        Thought Content: Thought content normal. Thought content is not paranoid or delusional. Thought content does not include homicidal or suicidal ideation. Thought content does not include homicidal or suicidal plan.        Cognition and Memory: Cognition and memory normal.        Judgment: Judgment normal.     Comments: Insight intact     Lab Review:     Component Value Date/Time   NA 139 08/17/2022 1303   NA 143 05/25/2021 1517   K 3.4 (L) 08/17/2022 1303   CL 106 08/17/2022 1303   CO2 24 08/17/2022 1303   GLUCOSE 87 08/17/2022 1303   BUN 10 08/17/2022 1303   BUN 16 05/25/2021 1517   CREATININE 0.56 08/17/2022 1303   CALCIUM 8.3 (L) 08/17/2022 1303   PROT 6.0 07/19/2022 1025   PROT 6.7 05/25/2021 1517   ALBUMIN 4.0 07/19/2022 1025  ALBUMIN 4.8 (H) 05/25/2021 1517   AST 20 07/19/2022 1025   ALT 17 07/19/2022 1025   ALKPHOS 88 07/19/2022 1025    BILITOT 0.5 07/19/2022 1025   BILITOT 0.2 05/25/2021 1517   GFRNONAA >60 08/17/2022 1303       Component Value Date/Time   WBC 6.1 08/17/2022 1303   RBC 4.31 08/17/2022 1303   HGB 14.2 08/17/2022 1303   HGB 13.6 05/25/2021 1517   HCT 41.3 08/17/2022 1303   HCT 39.5 05/25/2021 1517   PLT 232 08/17/2022 1303   PLT 244 05/25/2021 1517   MCV 95.8 08/17/2022 1303   MCV 99 (H) 05/25/2021 1517   MCH 32.9 08/17/2022 1303   MCHC 34.4 08/17/2022 1303   RDW 13.2 08/17/2022 1303   RDW 14.6 05/25/2021 1517   LYMPHSABS 1.4 08/17/2022 1303   LYMPHSABS 0.9 05/25/2021 1517   MONOABS 0.5 08/17/2022 1303   EOSABS 0.7 (H) 08/17/2022 1303   EOSABS 0.1 05/25/2021 1517   BASOSABS 0.0 08/17/2022 1303   BASOSABS 0.0 05/25/2021 1517    No results found for: "POCLITH", "LITHIUM"   No results found for: "PHENYTOIN", "PHENOBARB", "VALPROATE", "CBMZ"   .res Assessment: Plan:   Pt seen for 30 minutes and time spent discussing her question about ETOH use combined with current medication. Discussed that alcohol can intensify the effects of current medication and therefore recommend avoiding combining alcohol with medications. Discussed that regular alcohol use has had a negative impact on her sleep, mood, and anxiety in the past and that these symptoms have improved during periods when alcohol use has been minimal. Pt is in agreement with this and reports that she will reduce her alcohol intake.  Will continue Doxepin 100 mg at bedtime for insomnia and depression.  Will continue Effexor XR 37.5 mg daily for depression and anxiety.  Will discontinue Belsomra due to limited improvement and re-start Ambien CR 6.25 mg at bedtime since she reports that sleep has improved with resuming Ambien CR.  Will continue Alprazolam 0.5 mg po BID prn anxiety.  Pt to follow-up in 3 months or sooner if clinically indicated.  Patient advised to contact office with any questions, adverse effects, or acute worsening in signs and  symptoms.   Alayzia was seen today for follow-up.  Diagnoses and all orders for this visit:  Generalized anxiety disorder -     ALPRAZolam (XANAX) 0.5 MG tablet; Take 1 tablet (0.5 mg total) by mouth 2 (two) times daily as needed for anxiety. -     venlafaxine XR (EFFEXOR XR) 37.5 MG 24 hr capsule; Take 1 capsule (37.5 mg total) by mouth daily with breakfast.  Primary insomnia -     zolpidem (AMBIEN CR) 6.25 MG CR tablet; Take 1 tablet (6.25 mg total) by mouth at bedtime as needed. -     doxepin (SINEQUAN) 100 MG capsule; Take 1 capsule (100 mg total) by mouth at bedtime.  Recurrent major depressive disorder, in full remission ([place]) -     doxepin (SINEQUAN) 100 MG capsule; Take 1 capsule (100 mg total) by mouth at bedtime. -     venlafaxine XR (EFFEXOR XR) 37.5 MG 24 hr capsule; Take 1 capsule (37.5 mg total) by mouth daily with breakfast.     Please see After Visit Summary for patient specific instructions.  Future Appointments  Date Time Provider [place]  03/08/2023  8:20 AM [facility]    No orders of the defined types were placed in this encounter.     -------------------------------

## 2022-12-23 ENCOUNTER — Encounter: Payer: Self-pay | Admitting: Nurse Practitioner

## 2022-12-27 DIAGNOSIS — M9901 Segmental and somatic dysfunction of cervical region: Secondary | ICD-10-CM | POA: Diagnosis not present

## 2022-12-27 DIAGNOSIS — M5030 Other cervical disc degeneration, unspecified cervical region: Secondary | ICD-10-CM | POA: Diagnosis not present

## 2022-12-27 DIAGNOSIS — M9903 Segmental and somatic dysfunction of lumbar region: Secondary | ICD-10-CM | POA: Diagnosis not present

## 2022-12-27 DIAGNOSIS — M9902 Segmental and somatic dysfunction of thoracic region: Secondary | ICD-10-CM | POA: Diagnosis not present

## 2022-12-27 DIAGNOSIS — M5136 Other intervertebral disc degeneration, lumbar region: Secondary | ICD-10-CM | POA: Diagnosis not present

## 2022-12-27 DIAGNOSIS — M9904 Segmental and somatic dysfunction of sacral region: Secondary | ICD-10-CM | POA: Diagnosis not present

## 2023-01-03 DIAGNOSIS — M9901 Segmental and somatic dysfunction of cervical region: Secondary | ICD-10-CM | POA: Diagnosis not present

## 2023-01-03 DIAGNOSIS — M5030 Other cervical disc degeneration, unspecified cervical region: Secondary | ICD-10-CM | POA: Diagnosis not present

## 2023-01-03 DIAGNOSIS — M9902 Segmental and somatic dysfunction of thoracic region: Secondary | ICD-10-CM | POA: Diagnosis not present

## 2023-01-03 DIAGNOSIS — M5136 Other intervertebral disc degeneration, lumbar region: Secondary | ICD-10-CM | POA: Diagnosis not present

## 2023-01-03 DIAGNOSIS — M9903 Segmental and somatic dysfunction of lumbar region: Secondary | ICD-10-CM | POA: Diagnosis not present

## 2023-01-03 DIAGNOSIS — M9904 Segmental and somatic dysfunction of sacral region: Secondary | ICD-10-CM | POA: Diagnosis not present

## 2023-01-05 ENCOUNTER — Other Ambulatory Visit: Payer: Self-pay | Admitting: Family Medicine

## 2023-01-08 ENCOUNTER — Encounter: Payer: Self-pay | Admitting: Family Medicine

## 2023-01-08 MED ORDER — CEPHALEXIN 500 MG PO CAPS: 500.0000 mg | ORAL_CAPSULE | Freq: Two times a day (BID) | ORAL | 0 refills | Status: DC

## 2023-01-17 DIAGNOSIS — D3132 Benign neoplasm of left choroid: Secondary | ICD-10-CM | POA: Diagnosis not present

## 2023-01-17 DIAGNOSIS — H43812 Vitreous degeneration, left eye: Secondary | ICD-10-CM | POA: Diagnosis not present

## 2023-01-17 DIAGNOSIS — H353132 Nonexudative age-related macular degeneration, bilateral, intermediate dry stage: Secondary | ICD-10-CM | POA: Diagnosis not present

## 2023-01-17 DIAGNOSIS — H35723 Serous detachment of retinal pigment epithelium, bilateral: Secondary | ICD-10-CM | POA: Diagnosis not present

## 2023-01-17 DIAGNOSIS — H353231 Exudative age-related macular degeneration, bilateral, with active choroidal neovascularization: Secondary | ICD-10-CM | POA: Diagnosis not present

## 2023-02-28 DIAGNOSIS — H353132 Nonexudative age-related macular degeneration, bilateral, intermediate dry stage: Secondary | ICD-10-CM | POA: Diagnosis not present

## 2023-02-28 DIAGNOSIS — H353231 Exudative age-related macular degeneration, bilateral, with active choroidal neovascularization: Secondary | ICD-10-CM | POA: Diagnosis not present

## 2023-02-28 DIAGNOSIS — H43812 Vitreous degeneration, left eye: Secondary | ICD-10-CM | POA: Diagnosis not present

## 2023-02-28 DIAGNOSIS — D3132 Benign neoplasm of left choroid: Secondary | ICD-10-CM | POA: Diagnosis not present

## 2023-02-28 DIAGNOSIS — H35722 Serous detachment of retinal pigment epithelium, left eye: Secondary | ICD-10-CM | POA: Diagnosis not present

## 2023-02-28 DIAGNOSIS — H35721 Serous detachment of retinal pigment epithelium, right eye: Secondary | ICD-10-CM | POA: Diagnosis not present

## 2023-03-03 ENCOUNTER — Encounter: Payer: Self-pay | Admitting: Psychiatry

## 2023-03-03 ENCOUNTER — Ambulatory Visit: Payer: PPO | Admitting: Psychiatry

## 2023-03-03 DIAGNOSIS — F411 Generalized anxiety disorder: Secondary | ICD-10-CM

## 2023-03-03 DIAGNOSIS — F5101 Primary insomnia: Secondary | ICD-10-CM | POA: Diagnosis not present

## 2023-03-03 DIAGNOSIS — F3342 Major depressive disorder, recurrent, in full remission: Secondary | ICD-10-CM | POA: Diagnosis not present

## 2023-03-03 MED ORDER — VENLAFAXINE HCL ER 37.5 MG PO CP24: 37.5000 mg | ORAL_CAPSULE | Freq: Every day | ORAL | 1 refills | Status: DC

## 2023-03-03 MED ORDER — DOXEPIN HCL 100 MG PO CAPS: 100.0000 mg | ORAL_CAPSULE | Freq: Every day | ORAL | 1 refills | Status: DC

## 2023-03-03 MED ORDER — ZOLPIDEM TARTRATE ER 6.25 MG PO TBCR: 6.2500 mg | EXTENDED_RELEASE_TABLET | Freq: Every evening | ORAL | 1 refills | Status: DC | PRN

## 2023-03-03 MED ORDER — ALPRAZOLAM 0.5 MG PO TABS: 0.5000 mg | ORAL_TABLET | Freq: Two times a day (BID) | ORAL | 1 refills | Status: DC | PRN

## 2023-03-03 NOTE — Progress Notes (Signed)
Angie Liu [id number] 1946-01-05 77 y.o.  Subjective:   Patient ID:  Angie Liu is a 77 y.o. (DOB 06-Mar-1946) female.  Chief Complaint:  Chief Complaint  Patient presents with   Follow-up    Anxiety, depression, and insomnia    HPI Angie Liu presents to the office today for follow-up of depression, anxiety, and insomnia. She reports, "generally I am ok." She reports that she no longer has the anxiety about going to sleep like she has in the past. She reports that overall her sleep has improved. She reports that she has been sleeping well with current medications. Slept about 7 consecutive hours last night. She reports that many nights she will wake up between 2-3 am and take a second Xanax 0.5 mg. She reports that she has had periodic nightmares and vivid dreams long term.  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 She has some anxiety about this upcoming responsibility. She reports that she has had some stress in response to certain situations. Husband has been diagnosed with a heart issue and has an upcoming stress test. She reports that this was unexpected. Husband's brother is going through a "complicated divorce... and it affects [place]."   She reports occasionally noticing some mild depression upon awakening. She reports that physical movement is helpful when this occurs and vigorously making the bed "helps shake it off." She reports on occasion she has talked with her husband and able to process her feelings. She reports that her energy and motivation "varies." She reports that her concentration is ok overall, aside from occasionally losing her place with reading music- "I have to concentrate more to do it correctly."   She repots that she has gained weight and "I don't like that." She reports that in 2023 she has been eating more potato chips, gluten free cookies, soda, and wine. She reports that she is no longer  gaining weight and weight gain has plateaued. She has cut out potato chips and sodas.   Denies SI.   She has 2 upcoming trips. She is looking forward to being out in the sun at the beach.   Had a friend visit recently and this was enjoyable.   She reports having a glass of wine socially on occasion with friends.   Alprazolam last filled 12/07/22. Ambien CR last filled 12/03/22.  Past Psychotropic Medication Trials: Lyrica Alprazolam  Doxepin- Has been ineffective at 25 mg. Noticed some slight improvement in sleep initiation and maintenance with increase to 50 mg po QHS. Started 25 mg on 09/26/17.  Hydroxyzine- Ineffective Xanax Ambien CR- Has been most helpful Ambien Sonata Rozerem- Ineffective Belsomra-partially effective.  Trileptal- Took for several years Remeron Trazodone Bupropion Lexapro- Helpful many years ago but did not respond more recently. Wellbutrin- Tremor Luvox- Took 50 mg po qd Prozac- took briefly and did not respond well Zoloft- "remember I didn't like it." Cymbalta- May have caused tremors Has not taken Celexa, Buspar, Effexor Abilify, Lamictal, Seroquel, Olanzapine  PHQ2-9    Flowsheet Row Clinical Support from 03/05/2022 in [facility] Care at [facility] Office Visit from 01/21/2022 in [facility] Primary Care at [facility] Office Visit from 03/06/2015 in Primary Care at [facility] Total Score 0 0 0  PHQ-9 Total Score 0 0 --      Flowsheet Row ED from 08/17/2022 in [facility] Emergency Department  at [facility]  C-SSRS RISK CATEGORY No Risk        Review of Systems:  Review of Systems  HENT:  Positive for tinnitus.   Eyes:        Reports burning in her eyes after injections from macular degeneration.  Musculoskeletal:  Negative for gait problem.  Psychiatric/Behavioral:         Please refer to HPI    Medications: I have reviewed the patient's current medications.  Current Outpatient  Medications  Medication Sig Dispense Refill   alendronate (FOSAMAX) 70 MG tablet Take 1 tablet (70 mg total) by mouth every 7 (seven) days. Take with a full glass of water on an empty stomach. (Patient not taking: Reported on 09/21/2022) 12 tablet 3   [START ON 05/26/2023] ALPRAZolam (XANAX) 0.5 MG tablet Take 1 tablet (0.5 mg total) by mouth 2 (two) times daily as needed for anxiety. 180 tablet 1   b complex vitamins capsule Take 1 capsule by mouth daily.     doxepin (SINEQUAN) 100 MG capsule Take 1 capsule (100 mg total) by mouth at bedtime. 90 capsule 1   Multiple Vitamins-Minerals (PRESERVISION AREDS 2 PO) Take by mouth.     OVER THE COUNTER MEDICATION 4 capsules daily. Calm CP, takes 1 tab TID     OVER THE COUNTER MEDICATION Alpha gabba     Prenatal Vit-Fe Fumarate-FA (PRENATAL VITAMINS PO) Take by mouth.     progesterone (PROMETRIUM) 100 MG capsule TAKE ONE CAPSULE BY MOUTH ONE TIME DAILY 90 capsule 0   QUERCETIN PO Take by mouth as needed.     venlafaxine XR (EFFEXOR XR) 37.5 MG 24 hr capsule Take 1 capsule (37.5 mg total) by mouth daily with breakfast. 90 capsule 1   [START ON 05/26/2023] zolpidem (AMBIEN CR) 6.25 MG CR tablet Take 1 tablet (6.25 mg total) by mouth at bedtime as needed. 90 tablet 1   No current facility-administered medications for this visit.    Medication Side Effects: None  Allergies:  Allergies  Allergen Reactions   Dust Mite Extract    Latex Rash    Past Medical History:  Diagnosis Date   Anxiety    Depression    Hayfever     Past Medical History, Surgical history, Social history, and Family history were reviewed and updated as appropriate.   Please see review of systems for further details on the patient's review from today.   Objective:   Physical Exam:  Wt 160 lb (72.6 kg)   BMI 25.82 kg/m   Physical Exam Constitutional:      General: She is not in acute distress. Musculoskeletal:        General: No deformity.  Neurological:     Mental  Status: She is alert and oriented to person, place, and time.     Coordination: Coordination normal.  Psychiatric:        Attention and Perception: Attention and perception normal. She does not perceive auditory or visual hallucinations.        Mood and Affect: Mood normal. Mood is not anxious or depressed. Affect is not labile, blunt, angry or inappropriate.        Speech: Speech normal.        Behavior: Behavior normal.        Thought Content: Thought content normal. Thought content is not paranoid or delusional. Thought content does not include homicidal or suicidal ideation. Thought content does not include homicidal or suicidal plan.  Cognition and Memory: Cognition and memory normal.        Judgment: Judgment normal.     Comments: Insight intact     Lab Review:     Component Value Date/Time   NA 139 08/17/2022 1303   NA 143 05/25/2021 1517   K 3.4 (L) 08/17/2022 1303   CL 106 08/17/2022 1303   CO2 24 08/17/2022 1303   GLUCOSE 87 08/17/2022 1303   BUN 10 08/17/2022 1303   BUN 16 05/25/2021 1517   CREATININE 0.56 08/17/2022 1303   CALCIUM 8.3 (L) 08/17/2022 1303   PROT 6.0 07/19/2022 1025   PROT 6.7 05/25/2021 1517   ALBUMIN 4.0 07/19/2022 1025   ALBUMIN 4.8 (H) 05/25/2021 1517   AST 20 07/19/2022 1025   ALT 17 07/19/2022 1025   ALKPHOS 88 07/19/2022 1025   BILITOT 0.5 07/19/2022 1025   BILITOT 0.2 05/25/2021 1517   GFRNONAA >60 08/17/2022 1303       Component Value Date/Time   WBC 6.1 08/17/2022 1303   RBC 4.31 08/17/2022 1303   HGB 14.2 08/17/2022 1303   HGB 13.6 05/25/2021 1517   HCT 41.3 08/17/2022 1303   HCT 39.5 05/25/2021 1517   PLT 232 08/17/2022 1303   PLT 244 05/25/2021 1517   MCV 95.8 08/17/2022 1303   MCV 99 (H) 05/25/2021 1517   MCH 32.9 08/17/2022 1303   MCHC 34.4 08/17/2022 1303   RDW 13.2 08/17/2022 1303   RDW 14.6 05/25/2021 1517   LYMPHSABS 1.4 08/17/2022 1303   LYMPHSABS 0.9 05/25/2021 1517   MONOABS 0.5 08/17/2022 1303   EOSABS  0.7 (H) 08/17/2022 1303   EOSABS 0.1 05/25/2021 1517   BASOSABS 0.0 08/17/2022 1303   BASOSABS 0.0 05/25/2021 1517    No results found for: "POCLITH", "LITHIUM"   No results found for: "PHENYTOIN", "PHENOBARB", "VALPROATE", "CBMZ"   .res Assessment: Plan:   Will continue current plan of care since target signs and symptoms are well controlled without any tolerability issues. Will continue Doxepin 100 mg at bedtime for insomnia and depression.  Will continue Effexor XR 37.5 mg daily for depression and anxiety.  Will continue Ambien CR 6.25 mg at bedtime for insomnia.  Will continue Alprazolam 0.5 mg po BID prn anxiety.  Pt to follow-up in 6 months or sooner if clinically indicated.  Patient advised to contact office with any questions, adverse effects, or acute worsening in signs and symptoms.   Angie Liu was seen today for follow-up.  Diagnoses and all orders for this visit:  Generalized anxiety disorder -     ALPRAZolam (XANAX) 0.5 MG tablet; Take 1 tablet (0.5 mg total) by mouth 2 (two) times daily as needed for anxiety. -     venlafaxine XR (EFFEXOR XR) 37.5 MG 24 hr capsule; Take 1 capsule (37.5 mg total) by mouth daily with breakfast.  Primary insomnia -     doxepin (SINEQUAN) 100 MG capsule; Take 1 capsule (100 mg total) by mouth at bedtime. -     zolpidem (AMBIEN CR) 6.25 MG CR tablet; Take 1 tablet (6.25 mg total) by mouth at bedtime as needed.  Recurrent major depressive disorder, in full remission (HCC) -     doxepin (SINEQUAN) 100 MG capsule; Take 1 capsule (100 mg total) by mouth at bedtime. -     venlafaxine XR (EFFEXOR XR) 37.5 MG 24 hr capsule; Take 1 capsule (37.5 mg total) by mouth daily with breakfast.     Please see After Visit Summary for patient specific  instructions.  Future Appointments  Date Time Provider Department Center  03/08/2023  8:20 AM [facility] ANNUAL WELLNESS VISIT 1 [facility]  06/27/2023  8:40 AM Copland, Gwenlyn Found, MD [facility]  09/01/2023   1:00 PM Corie Chiquito, PMHNP [facility] None    No orders of the defined types were placed in this encounter.   -------------------------------

## 2023-03-08 ENCOUNTER — Ambulatory Visit (INDEPENDENT_AMBULATORY_CARE_PROVIDER_SITE_OTHER): Payer: PPO | Admitting: *Deleted

## 2023-03-08 DIAGNOSIS — Z0000 Encounter for general adult medical examination without abnormal findings: Secondary | ICD-10-CM | POA: Diagnosis not present

## 2023-03-08 NOTE — Progress Notes (Signed)
Subjective:   Angie Liu is a 77 y.o. female who presents for Medicare Annual (Subsequent) preventive examination.  I connected with  Angie Liu on 03/08/23 by a audio enabled telemedicine application and verified that I am speaking with the correct person using two identifiers.  Patient Location: Home  Provider Location: Office/Clinic  I discussed the limitations of evaluation and management by telemedicine. The patient expressed understanding and agreed to proceed.   Review of Systems     Cardiac Risk Factors include: advanced age (>45men, >33 women)     Objective:    There were no vitals filed for this visit. There is no height or weight on file to calculate BMI.     03/08/2023    8:21 AM 08/17/2022    1:00 PM 03/05/2022    8:33 AM  Advanced Directives  Does Patient Have a Medical Advance Directive? Yes Yes Yes  Type of Estate agent of [place];Living will Healthcare Power of [place];Living will Healthcare Power of [place];Living will  Does patient want to make changes to medical advance directive? No - Patient declined    Copy of Healthcare Power of Attorney in Chart? No - copy requested  No - copy requested  Would patient like information on creating a medical advance directive?   No - Patient declined    Current Medications (verified) Outpatient Encounter Medications as of 03/08/2023  Medication Sig   [START ON 05/26/2023] ALPRAZolam (XANAX) 0.5 MG tablet Take 1 tablet (0.5 mg total) by mouth 2 (two) times daily as needed for anxiety.   b complex vitamins capsule Take 1 capsule by mouth daily.   doxepin (SINEQUAN) 100 MG capsule Take 1 capsule (100 mg total) by mouth at bedtime.   Multiple Vitamins-Minerals (PRESERVISION AREDS 2 PO) Take by mouth.   OVER THE COUNTER MEDICATION 4 capsules daily. Calm CP, takes 1 tab TID   OVER THE COUNTER MEDICATION Alpha gabba   Prenatal Vit-Fe Fumarate-FA (PRENATAL VITAMINS PO) Take by mouth.    progesterone (PROMETRIUM) 100 MG capsule TAKE ONE CAPSULE BY MOUTH ONE TIME DAILY   venlafaxine XR (EFFEXOR XR) 37.5 MG 24 hr capsule Take 1 capsule (37.5 mg total) by mouth daily with breakfast.   [START ON 05/26/2023] zolpidem (AMBIEN CR) 6.25 MG CR tablet Take 1 tablet (6.25 mg total) by mouth at bedtime as needed.   [DISCONTINUED] alendronate (FOSAMAX) 70 MG tablet Take 1 tablet (70 mg total) by mouth every 7 (seven) days. Take with a full glass of water on an empty stomach. (Patient not taking: Reported on 09/21/2022)   [DISCONTINUED] QUERCETIN PO Take by mouth as needed.   No facility-administered encounter medications on file as of 03/08/2023.    Allergies (verified) Dust mite extract and Latex   History: Past Medical History:  Diagnosis Date   Anxiety    Depression    Hayfever    Past Surgical History:  Procedure Laterality Date   CATARACT EXTRACTION W/PHACO Bilateral 2018   TONSILLECTOMY     Family History  Problem Relation Age of Onset   Cancer Mother        colon   Depression Mother    Diabetes Father    Depression Father    Depression Son    Depression Paternal Uncle    Social History   Socioeconomic History   Marital status: Married    Spouse name: Onalee Hua   Number of children: 2   Years of education: Not on file   Highest  education level: Master's degree (e.g., MA, MS, MEng, MEd, MSW, MBA)  Occupational History   Not on file  Tobacco Use   Smoking status: Never   Smokeless tobacco: Never  Substance and Sexual Activity   Alcohol use: Yes    Alcohol/week: 2.0 standard drinks of alcohol    Types: 2 Glasses of wine per week    Comment: 2 glass a day   Drug use: No   Sexual activity: Not on file  Other Topics Concern   Not on file  Social History Narrative   Live with husband   Right handed   Caffeine: 4-5 cups of coffee   Social Determinants of Health   Financial Resource Strain: Low Risk  (03/05/2022)   Overall Financial Resource Strain (CARDIA)     Difficulty of Paying Living Expenses: Not hard at all  Food Insecurity: No Food Insecurity (03/08/2023)   Hunger Vital Sign    Worried About Running Out of Food in the Last Year: Never true    Ran Out of Food in the Last Year: Never true  Transportation Needs: No Transportation Needs (03/08/2023)   PRAPARE - Administrator, Civil Service (Medical): No    Lack of Transportation (Non-Medical): No  Physical Activity: Insufficiently Active (03/05/2022)   Exercise Vital Sign    Days of Exercise per Week: 5 days    Minutes of Exercise per Session: 20 min  Stress: No Stress Concern Present (03/05/2022)   [employer] of Occupational Health - Occupational Stress Questionnaire    Feeling of Stress : Not at all  Social Connections: Socially Integrated (03/05/2022)   Social Connection and Isolation Panel [NHANES]    Frequency of Communication with Friends and Family: Three times a week    Frequency of Social Gatherings with Friends and Family: Three times a week    Attends Religious Services: More than 4 times per year    Active Member of Clubs or Organizations: Yes    Attends Engineer, structural: More than 4 times per year    Marital Status: Married    Tobacco Counseling Counseling given: Not Answered   Clinical Intake:  Pre-visit preparation completed: Yes  Pain : No/denies pain  BMI - recorded: 25.82 Nutritional Status: BMI 25 -29 Overweight Nutritional Risks: None Diabetes: No  How often do you need to have someone help you when you read instructions, pamphlets, or other written materials from your doctor or pharmacy?: 1 - Never  Activities of Daily Living    03/08/2023    8:26 AM  In your present state of health, do you have any difficulty performing the following activities:  Hearing? 1  Comment wears hearing aids  Vision? 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 0  Walking or climbing stairs? 0  Dressing or  bathing? 0  Doing errands, shopping? 0  Preparing Food and eating ? N  Using the Toilet? N  In the past six months, have you accidently leaked urine? Y  Do you have problems with loss of bowel control? N  Managing your Medications? N  Managing your Finances? N  Housekeeping or managing your Housekeeping? N    Patient Care Team: Copland, Gwenlyn Found, MD as PCP - General (Family Medicine) Zelphia Cairo, MD as Attending Physician (Obstetrics and Gynecology) Lenell Antu, MD as Referring Physician (Psychiatry) Flo Shanks, MD as Attending Physician (Otolaryngology)  Indicate any recent Medical Services you may have received from other than  [facility] providers in the past year (date may be approximate).     Assessment:   This is a routine wellness examination for Angie Liu.  Hearing/Vision screen No results found.  Dietary issues and exercise activities discussed: Current Exercise Habits: Home exercise routine;Structured exercise class, Type of exercise: walking;yoga;strength training/weights, Time (Minutes): 60, Frequency (Times/Week): 3, Weekly Exercise (Minutes/Week): 180, Intensity: Mild, Exercise limited by: None identified   Goals Addressed   None    Depression Screen    03/08/2023    8:24 AM 03/05/2022    8:23 AM 01/21/2022    1:08 PM 03/06/2015    3:10 PM  PHQ 2/9 Scores  PHQ - 2 Score 0 0 0 0  PHQ- 9 Score  0 0     Fall Risk    03/08/2023    8:22 AM 03/05/2022    8:38 AM 01/21/2022    1:07 PM 07/23/2021    1:04 PM 03/06/2015    3:10 PM  Fall Risk   Falls in the past year? 1 1 1  0 No  Number falls in past yr: 0 1 1 0   Injury with Fall? 0 1 1 0   Risk for fall due to : No Fall Risks History of fall(s);Impaired balance/gait     Follow up Falls evaluation completed Falls prevention discussed       FALL RISK PREVENTION PERTAINING TO THE HOME:  Any stairs in or around the home? Yes  If so, are there any without handrails? No  Home free of loose throw rugs in walkways,  pet beds, electrical cords, etc? Yes  Adequate lighting in your home to reduce risk of falls? Yes   ASSISTIVE DEVICES UTILIZED TO PREVENT FALLS:  Life alert? No  Use of a cane, walker or w/c? No  Grab bars in the bathroom? Yes  Shower chair or bench in shower? No  Elevated toilet seat or a handicapped toilet? No   TIMED UP AND GO:  Was the test performed?  No, audio visit .    Cognitive Function:        03/08/2023    8:35 AM 03/05/2022   10:19 AM 03/05/2022    8:44 AM  6CIT Screen  What Year? 0 points 0 points 0 points  What month? 0 points 0 points 0 points  What time? 0 points 0 points 0 points  Count back from 20 0 points 0 points 0 points  Months in reverse 0 points 0 points 0 points  Repeat phrase 0 points 0 points 0 points  Total Score 0 points 0 points 0 points    Immunizations Immunization History  Administered Date(s) Administered   Fluad Quad(high Dose 65+) 07/23/2021, 07/19/2022   Influenza Split 07/31/2012   PFIZER(Purple Top)SARS-COV-2 Vaccination 06/26/2020, 07/17/2020, 02/09/2021   PNEUMOCOCCAL CONJUGATE-20 01/21/2022   Pfizer Covid-19 Vaccine Bivalent Booster 22yrs & up 08/14/2021   Td 04/08/2013, 07/19/2022   Zoster Recombinat (Shingrix) 07/29/2021, 09/14/2021   Zoster, Live 12/18/2008    TDAP status: Up to date  Flu Vaccine status: Up to date  Pneumococcal vaccine status: Up to date  Covid-19 vaccine status: Information provided on how to obtain vaccines.   Qualifies for Shingles Vaccine? Yes   Zostavax completed Yes   Shingrix Completed?: Yes  Screening Tests Health Maintenance  Topic Date Due   Hepatitis C Screening  Never done   COVID-19 Vaccine (5 - 2023-24 season) 07/02/2022   Medicare Annual Wellness (AWV)  03/06/2023   INFLUENZA VACCINE  06/02/2023  DTaP/Tdap/Td (3 - Tdap) 07/19/2032   Pneumonia Vaccine 54+ Years old  Completed   DEXA SCAN  Completed   Zoster Vaccines- Shingrix  Completed   HPV VACCINES  Aged Out   COLONOSCOPY  (Pts 45-67yrs Insurance coverage will need to be confirmed)  Discontinued    Health Maintenance  Health Maintenance Due  Topic Date Due   Hepatitis C Screening  Never done   COVID-19 Vaccine (5 - 2023-24 season) 07/02/2022   Medicare Annual Wellness (AWV)  03/06/2023    Colorectal cancer screening: No longer required.   Mammogram status: Completed 07/09/22. Repeat every year  Bone Density status: Completed 06/08/22. Results reflect: Bone density results: OSTEOPOROSIS. Repeat every 2 years.  Lung Cancer Screening: (Low Dose CT Chest recommended if Age 41-80 years, 30 pack-year currently smoking OR have quit w/in 15years.) does not qualify.   Additional Screening:  Hepatitis C Screening: does qualify; Completed N/a  Vision Screening: Recommended annual ophthalmology exams for early detection of glaucoma and other disorders of the eye. Is the patient up to date with their annual eye exam?  Yes  Who is the provider or what is the name of the office in which the patient attends annual eye exams? Dr. Luciana Axe If pt is not established with a provider, would they like to be referred to a provider to establish care? No .   Dental Screening: Recommended annual dental exams for proper oral hygiene  Community Resource Referral / Chronic Care Management: CRR required this visit?  No   CCM required this visit?  No      Plan:     I have personally reviewed and noted the following in the patient's chart:   Medical and social history Use of alcohol, tobacco or illicit drugs  Current medications and supplements including opioid prescriptions. Patient is not currently taking opioid prescriptions. Functional ability and status Nutritional status Physical activity Advanced directives List of other physicians Hospitalizations, surgeries, and ER visits in previous 12 months Vitals Screenings to include cognitive, depression, and falls Referrals and appointments  In addition, I have  reviewed and discussed with patient certain preventive protocols, quality metrics, and best practice recommendations. A written personalized care plan for preventive services as well as general preventive health recommendations were provided to patient.   Due to this being a telephonic visit, the after visit summary with patients personalized plan was offered to patient via mail or my-chart.  Patient would like to access on my-chart.  Donne Anon, [place]   03/08/2023   Nurse Notes: None

## 2023-03-08 NOTE — Patient Instructions (Signed)
[name] , Thank you for taking time to come for your Medicare Wellness Visit. I appreciate your ongoing commitment to your health goals. Please review the following plan we discussed and let me know if I can assist you in the future.   These are the goals we discussed:  Goals      Exercise 3x per week (30 min per time)     Continue to exercise, eat healthy and stay well.         This is a list of the screening recommended for you and due dates:  Health Maintenance  Topic Date Due   Hepatitis C Screening: USPSTF Recommendation to screen - Ages 24-79 yo.  Never done   COVID-19 Vaccine (6 - 2023-24 season) 04/11/2023   Flu Shot  06/02/2023   Medicare Annual Wellness Visit  03/07/2024   DTaP/Tdap/Td vaccine (3 - Tdap) 07/19/2032   Pneumonia Vaccine  Completed   DEXA scan (bone density measurement)  Completed   Zoster (Shingles) Vaccine  Completed   HPV Vaccine  Aged Out   Colon Cancer Screening  Discontinued     Next appointment: Follow up in one year for your annual wellness visit.   Preventive Care 36 Years and Older, Female Preventive care refers to lifestyle choices and visits with your health care provider that can promote health and wellness. What does preventive care include? A yearly physical exam. This is also called an annual well check. Dental exams once or twice a year. Routine eye exams. Ask your health care provider how often you should have your eyes checked. Personal lifestyle choices, including: Daily care of your teeth and gums. Regular physical activity. Eating a healthy diet. Avoiding tobacco and drug use. Limiting alcohol use. Practicing safe sex. Taking low-dose aspirin every day. Taking vitamin and mineral supplements as recommended by your health care provider. What happens during an annual well check? The services and screenings done by your health care provider during your annual well check will depend on your age, overall health, lifestyle risk  factors, and family history of disease. Counseling  Your health care provider may ask you questions about your: Alcohol use. Tobacco use. Drug use. Emotional well-being. Home and relationship well-being. Sexual activity. Eating habits. History of falls. Memory and ability to understand (cognition). Work and work Astronomer. Reproductive health. Screening  You may have the following tests or measurements: Height, weight, and BMI. Blood pressure. Lipid and cholesterol levels. These may be checked every 5 years, or more frequently if you are over 93 years old. Skin check. Lung cancer screening. You may have this screening every year starting at age 42 if you have a 30-pack-year history of smoking and currently smoke or have quit within the past 15 years. Fecal occult blood test (FOBT) of the stool. You may have this test every year starting at age 71. Flexible sigmoidoscopy or colonoscopy. You may have a sigmoidoscopy every 5 years or a colonoscopy every 10 years starting at age 17. Hepatitis C blood test. Hepatitis B blood test. Sexually transmitted disease (STD) testing. Diabetes screening. This is done by checking your blood sugar (glucose) after you have not eaten for a while (fasting). You may have this done every 1-3 years. Bone density scan. This is done to screen for osteoporosis. You may have this done starting at age 68. Mammogram. This may be done every 1-2 years. Talk to your health care provider about how often you should have regular mammograms. Talk with your health care  provider about your test results, treatment options, and if necessary, the need for more tests. Vaccines  Your health care provider may recommend certain vaccines, such as: Influenza vaccine. This is recommended every year. Tetanus, diphtheria, and acellular pertussis (Tdap, Td) vaccine. You may need a Td booster every 10 years. Zoster vaccine. You may need this after age 67. Pneumococcal 13-valent  conjugate (PCV13) vaccine. One dose is recommended after age 25. Pneumococcal polysaccharide (PPSV23) vaccine. One dose is recommended after age 84. Talk to your health care provider about which screenings and vaccines you need and how often you need them. This information is not intended to replace advice given to you by your health care provider. Make sure you discuss any questions you have with your health care provider. Document Released: 11/14/2015 Document Revised: 07/07/2016 Document Reviewed: 08/19/2015 Elsevier Interactive Patient Education  2017 [employer] Prevention in the Home Falls can cause injuries. They can happen to people of all ages. There are many things you can do to make your home safe and to help prevent falls. What can I do on the outside of my home? Regularly fix the edges of walkways and driveways and fix any cracks. Remove anything that might make you trip as you walk through a door, such as a raised step or threshold. Trim any bushes or trees on the path to your home. Use bright outdoor lighting. Clear any walking paths of anything that might make someone trip, such as rocks or tools. Regularly check to see if handrails are loose or broken. Make sure that both sides of any steps have handrails. Any raised decks and porches should have guardrails on the edges. Have any leaves, snow, or ice cleared regularly. Use sand or salt on walking paths during winter. Clean up any spills in your garage right away. This includes oil or grease spills. What can I do in the bathroom? Use night lights. Install grab bars by the toilet and in the tub and shower. Do not use towel bars as grab bars. Use non-skid mats or decals in the tub or shower. If you need to sit down in the shower, use a plastic, non-slip stool. Keep the floor dry. Clean up any water that spills on the floor as soon as it happens. Remove soap buildup in the tub or shower regularly. Attach bath mats  securely with double-sided non-slip rug tape. Do not have throw rugs and other things on the floor that can make you trip. What can I do in the bedroom? Use night lights. Make sure that you have a light by your bed that is easy to reach. Do not use any sheets or blankets that are too big for your bed. They should not hang down onto the floor. Have a firm chair that has side arms. You can use this for support while you get dressed. Do not have throw rugs and other things on the floor that can make you trip. What can I do in the kitchen? Clean up any spills right away. Avoid walking on wet floors. Keep items that you use a lot in easy-to-reach places. If you need to reach something above you, use a strong step stool that has a grab bar. Keep electrical cords out of the way. Do not use floor polish or wax that makes floors slippery. If you must use wax, use non-skid floor wax. Do not have throw rugs and other things on the floor that can make you trip. What can I  do with my stairs? Do not leave any items on the stairs. Make sure that there are handrails on both sides of the stairs and use them. Fix handrails that are broken or loose. Make sure that handrails are as long as the stairways. Check any carpeting to make sure that it is firmly attached to the stairs. Fix any carpet that is loose or worn. Avoid having throw rugs at the top or bottom of the stairs. If you do have throw rugs, attach them to the floor with carpet tape. Make sure that you have a light switch at the top of the stairs and the bottom of the stairs. If you do not have them, ask someone to add them for you. What else can I do to help prevent falls? Wear shoes that: Do not have high heels. Have rubber bottoms. Are comfortable and fit you well. Are closed at the toe. Do not wear sandals. If you use a stepladder: Make sure that it is fully opened. Do not climb a closed stepladder. Make sure that both sides of the stepladder  are locked into place. Ask someone to hold it for you, if possible. Clearly mark and make sure that you can see: Any grab bars or handrails. First and last steps. Where the edge of each step is. Use tools that help you move around (mobility aids) if they are needed. These include: Canes. Walkers. Scooters. Crutches. Turn on the lights when you go into a dark area. Replace any light bulbs as soon as they burn out. Set up your furniture so you have a clear path. Avoid moving your furniture around. If any of your floors are uneven, fix them. If there are any pets around you, be aware of where they are. Review your medicines with your doctor. Some medicines can make you feel dizzy. This can increase your chance of falling. Ask your doctor what other things that you can do to help prevent falls. This information is not intended to replace advice given to you by your health care provider. Make sure you discuss any questions you have with your health care provider. Document Released: 08/14/2009 Document Revised: 03/25/2016 Document Reviewed: 11/22/2014 Elsevier Interactive Patient Education  2017 [employer].

## 2023-04-02 ENCOUNTER — Other Ambulatory Visit: Payer: Self-pay | Admitting: Family Medicine

## 2023-04-04 DIAGNOSIS — Z961 Presence of intraocular lens: Secondary | ICD-10-CM | POA: Diagnosis not present

## 2023-04-04 DIAGNOSIS — H52203 Unspecified astigmatism, bilateral: Secondary | ICD-10-CM | POA: Diagnosis not present

## 2023-04-04 DIAGNOSIS — D3132 Benign neoplasm of left choroid: Secondary | ICD-10-CM | POA: Diagnosis not present

## 2023-04-12 DIAGNOSIS — H353231 Exudative age-related macular degeneration, bilateral, with active choroidal neovascularization: Secondary | ICD-10-CM | POA: Diagnosis not present

## 2023-04-12 DIAGNOSIS — H353132 Nonexudative age-related macular degeneration, bilateral, intermediate dry stage: Secondary | ICD-10-CM | POA: Diagnosis not present

## 2023-04-12 DIAGNOSIS — D3132 Benign neoplasm of left choroid: Secondary | ICD-10-CM | POA: Diagnosis not present

## 2023-04-12 DIAGNOSIS — H43812 Vitreous degeneration, left eye: Secondary | ICD-10-CM | POA: Diagnosis not present

## 2023-04-12 DIAGNOSIS — H35722 Serous detachment of retinal pigment epithelium, left eye: Secondary | ICD-10-CM | POA: Diagnosis not present

## 2023-04-12 DIAGNOSIS — H35721 Serous detachment of retinal pigment epithelium, right eye: Secondary | ICD-10-CM | POA: Diagnosis not present

## 2023-04-13 ENCOUNTER — Encounter: Payer: Self-pay | Admitting: Ophthalmology

## 2023-04-14 ENCOUNTER — Encounter: Payer: Self-pay | Admitting: Family Medicine

## 2023-04-14 MED ORDER — ALENDRONATE SODIUM 70 MG PO TABS: 70.0000 mg | ORAL_TABLET | ORAL | 3 refills | Status: DC

## 2023-05-03 DIAGNOSIS — D1801 Hemangioma of skin and subcutaneous tissue: Secondary | ICD-10-CM | POA: Diagnosis not present

## 2023-05-03 DIAGNOSIS — L720 Epidermal cyst: Secondary | ICD-10-CM | POA: Diagnosis not present

## 2023-05-03 DIAGNOSIS — D2272 Melanocytic nevi of left lower limb, including hip: Secondary | ICD-10-CM | POA: Diagnosis not present

## 2023-05-03 DIAGNOSIS — D225 Melanocytic nevi of trunk: Secondary | ICD-10-CM | POA: Diagnosis not present

## 2023-05-03 DIAGNOSIS — L814 Other melanin hyperpigmentation: Secondary | ICD-10-CM | POA: Diagnosis not present

## 2023-05-03 DIAGNOSIS — L821 Other seborrheic keratosis: Secondary | ICD-10-CM | POA: Diagnosis not present

## 2023-05-03 DIAGNOSIS — Z85828 Personal history of other malignant neoplasm of skin: Secondary | ICD-10-CM | POA: Diagnosis not present

## 2023-05-04 ENCOUNTER — Telehealth: Payer: Self-pay | Admitting: Family Medicine

## 2023-05-04 NOTE — Telephone Encounter (Signed)
Pt called stating that she had started her fosamax last week and accidentally took three doses within three days as she thought it was supposed to be taken daily instead of weekly. Pt stated she is mainly concerned that she is going to run out sooner due to this. Pt stated she had some nausea during the doses but it has ceased after stopping. Advised pt to keep an eye out of any other symptoms and to let us know if anything new comes up. Pt would like to know if she needs to take her next dose next week or if she needs to hold off. Please advise.

## 2023-05-04 NOTE — Telephone Encounter (Signed)
Called poison control regarding this incident-Angie Liu accidentally took 1 dose of alendronate 70 mg 3 consecutive days last week.  The third day she had some nausea but no other apparent side effects-this is when she realized she was taking the medication incorrectly.  Per poison control, watch for any sign of ulceration in the esophagus or stomach.  Otherwise we do not expect any significant repercussions.  Discussed with patient, she plans to pause the medication for couple of weeks to let her prescription catch up, then will restart weekly use

## 2023-05-31 DIAGNOSIS — H35723 Serous detachment of retinal pigment epithelium, bilateral: Secondary | ICD-10-CM | POA: Diagnosis not present

## 2023-05-31 DIAGNOSIS — H353231 Exudative age-related macular degeneration, bilateral, with active choroidal neovascularization: Secondary | ICD-10-CM | POA: Diagnosis not present

## 2023-05-31 DIAGNOSIS — H43812 Vitreous degeneration, left eye: Secondary | ICD-10-CM | POA: Diagnosis not present

## 2023-05-31 DIAGNOSIS — D3132 Benign neoplasm of left choroid: Secondary | ICD-10-CM | POA: Diagnosis not present

## 2023-05-31 DIAGNOSIS — H353132 Nonexudative age-related macular degeneration, bilateral, intermediate dry stage: Secondary | ICD-10-CM | POA: Diagnosis not present

## 2023-06-01 ENCOUNTER — Encounter (INDEPENDENT_AMBULATORY_CARE_PROVIDER_SITE_OTHER): Payer: Self-pay

## 2023-06-25 NOTE — Progress Notes (Unsigned)
[facility] [address] [place], [place] [address] [phone]  Date:  06/27/2023   Name:  DAILYNN Liu   DOB:  05-15-1946   MRN:  [id number]  PCP:  Pearline Cables, MD    Chief Complaint: No chief complaint on file.   History of Present Illness:  Angie Liu is a 77 y.o. 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   Tremor of both outstretched hands 05/25/2021   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   Cognitive decline 05/25/2021   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   Exudative age-related macular degeneration of right eye with active choroidal neovascularization (HCC) 06/04/2020   Exudative age-related macular degeneration of left eye with active choroidal neovascularization (HCC) 02/07/2020   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   Posterior vitreous detachment of left eye 02/07/2020   Intermediate stage nonexudative age-related macular degeneration of left eye 02/07/2020   Intermediate stage nonexudative age-related macular degeneration of right eye 02/07/2020   GAD (generalized anxiety disorder) 11/28/2018   Recurrent major depression in partial remission (HCC) 11/28/2018   Chronic insomnia 02/02/2018   Depression 02/02/2018   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   Sensorineural  hearing loss (SNHL), 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   Tinnitus of both ears 10/17/2017   Seborrheic dermatitis 07/21/2017   Allergic rhinitis, seasonal 10/23/2012   Depression with anxiety 10/23/2012    Past Medical History:  Diagnosis Date   Anxiety    Depression    Hayfever     Past Surgical History:  Procedure Laterality Date   CATARACT EXTRACTION W/PHACO Bilateral 2018   TONSILLECTOMY      Social History   Tobacco Use   Smoking status: Never   Smokeless tobacco: Never  Substance Use Topics   Alcohol use: Yes    Alcohol/week: 2.0 standard drinks of alcohol    Types: 2 Glasses of wine per week    Comment: 2 glass a day   Drug use: No    Family History  Problem Relation Age of Onset   Cancer Mother        colon   Depression Mother    Diabetes Father    Depression Father    Depression Son    Depression Paternal Uncle     Allergies  Allergen Reactions   Dust Mite Extract    Latex Rash    Medication list has been reviewed and updated.  Current Outpatient Medications on File Prior to Visit  Medication Sig Dispense Refill   alendronate (FOSAMAX) 70 MG tablet Take 1 tablet (70 mg total) by mouth every 7 (seven) days. Take with a full glass of water on an empty stomach. 12 tablet 3   ALPRAZolam (XANAX) 0.5 MG tablet Take 1 tablet (0.5 mg total) by mouth 2 (two) times daily as needed for anxiety. 180 tablet 1   b  complex vitamins capsule Take 1 capsule by mouth daily.     doxepin (SINEQUAN) 100 MG capsule Take 1 capsule (100 mg total) by mouth at bedtime. 90 capsule 1   Multiple Vitamins-Minerals (PRESERVISION AREDS 2 PO) Take by mouth.     OVER THE COUNTER MEDICATION 4 capsules daily. Calm CP, takes 1 tab TID     OVER THE COUNTER MEDICATION Alpha gabba     Prenatal Vit-Fe Fumarate-FA (PRENATAL VITAMINS PO) Take by mouth.     progesterone (PROMETRIUM) 100 MG capsule TAKE ONE CAPSULE BY MOUTH ONE TIME DAILY 90 capsule 0   venlafaxine XR (EFFEXOR XR) 37.5 MG 24 hr capsule  Take 1 capsule (37.5 mg total) by mouth daily with breakfast. 90 capsule 1   zolpidem (AMBIEN CR) 6.25 MG CR tablet Take 1 tablet (6.25 mg total) by mouth at bedtime as needed. 90 tablet 1   No current facility-administered medications on file prior to visit.    Review of Systems:  As per HPI- otherwise negative.   Physical Examination: There were no vitals filed for this visit. There were no vitals filed for this visit. There is no height or weight on file to calculate BMI. Ideal Body Weight:    GEN: no acute distress. HEENT: Atraumatic, Normocephalic.  Ears and Nose: No external deformity. CV: RRR, No M/G/R. No JVD. No thrill. No extra heart sounds. PULM: CTA B, no wheezes, crackles, rhonchi. No retractions. No resp. distress. No accessory muscle use. ABD: S, NT, ND, +BS. No rebound. No HSM. EXTR: No c/c/e PSYCH: Normally interactive. Conversant.    Assessment and Plan: *** Physical exam- recommended healthy diet and exercise routine  Signed Abbe Amsterdam, MD

## 2023-06-25 NOTE — Patient Instructions (Signed)
Good to see you today- I will be in touch with your labs Recommend covid booster and flu shot this fall - can be done at your pharmacy at your convenience  Please stop by lab and then the imaging dept on the ground floor to ask about the coronary CT scan  Please do reach out to Ms Montez Morita about a medication adjustment  Work on exercise and decreased calorie intake for weight control!

## 2023-06-27 ENCOUNTER — Encounter: Payer: PPO | Admitting: Family Medicine

## 2023-06-27 ENCOUNTER — Encounter: Payer: Self-pay | Admitting: Family Medicine

## 2023-06-27 ENCOUNTER — Ambulatory Visit (INDEPENDENT_AMBULATORY_CARE_PROVIDER_SITE_OTHER): Payer: PPO | Admitting: Family Medicine

## 2023-06-27 VITALS — BP 122/60 | HR 77 | Temp 97.5°F | Resp 18 | Ht 66.0 in | Wt 173.6 lb

## 2023-06-27 DIAGNOSIS — Z130 Encounter for screening for diseases of the blood and blood-forming organs and certain disorders involving the immune mechanism: Secondary | ICD-10-CM | POA: Diagnosis not present

## 2023-06-27 DIAGNOSIS — Z13220 Encounter for screening for lipoid disorders: Secondary | ICD-10-CM

## 2023-06-27 DIAGNOSIS — M810 Age-related osteoporosis without current pathological fracture: Secondary | ICD-10-CM

## 2023-06-27 DIAGNOSIS — R7303 Prediabetes: Secondary | ICD-10-CM

## 2023-06-27 DIAGNOSIS — R5383 Other fatigue: Secondary | ICD-10-CM

## 2023-06-27 DIAGNOSIS — R635 Abnormal weight gain: Secondary | ICD-10-CM

## 2023-06-27 DIAGNOSIS — Z1329 Encounter for screening for other suspected endocrine disorder: Secondary | ICD-10-CM | POA: Diagnosis not present

## 2023-06-27 DIAGNOSIS — Z0000 Encounter for general adult medical examination without abnormal findings: Secondary | ICD-10-CM | POA: Diagnosis not present

## 2023-06-27 DIAGNOSIS — F5101 Primary insomnia: Secondary | ICD-10-CM | POA: Diagnosis not present

## 2023-06-27 DIAGNOSIS — Z1159 Encounter for screening for other viral diseases: Secondary | ICD-10-CM

## 2023-06-27 LAB — COMPREHENSIVE METABOLIC PANEL
ALT: 21 U/L (ref 0–35)
AST: 21 U/L (ref 0–37)
Albumin: 4.1 g/dL (ref 3.5–5.2)
Alkaline Phosphatase: 72 U/L (ref 39–117)
BUN: 15 mg/dL (ref 6–23)
CO2: 25 mEq/L (ref 19–32)
Calcium: 8.7 mg/dL (ref 8.4–10.5)
Chloride: 106 mEq/L (ref 96–112)
Creatinine, Ser: 0.76 mg/dL (ref 0.40–1.20)
GFR: 74.81 mL/min (ref >60.00)
Glucose, Bld: 107 mg/dL — ABNORMAL HIGH (ref 70–99)
Potassium: 4.5 mEq/L (ref 3.5–5.1)
Sodium: 140 mEq/L (ref 135–145)
Total Bilirubin: 0.5 mg/dL (ref 0.2–1.2)
Total Protein: 6 g/dL (ref 6.0–8.3)

## 2023-06-27 LAB — LIPID PANEL
Cholesterol: 225 mg/dL — ABNORMAL HIGH (ref 0–200)
HDL: 81.5 mg/dL (ref >39.00)
LDL Cholesterol: 128 mg/dL — ABNORMAL HIGH (ref 0–99)
NonHDL: 143.05
Total CHOL/HDL Ratio: 3
Triglycerides: 74 mg/dL (ref 0.0–149.0)
VLDL: 14.8 mg/dL (ref 0.0–40.0)

## 2023-06-27 LAB — HEMOGLOBIN A1C: Hgb A1c MFr Bld: 5.7 % (ref 4.6–6.5)

## 2023-06-27 LAB — CBC
HCT: 41.4 % (ref 36.0–46.0)
Hemoglobin: 13.8 g/dL (ref 12.0–15.0)
MCHC: 33.2 g/dL (ref 30.0–36.0)
MCV: 102.6 fl — ABNORMAL HIGH (ref 78.0–100.0)
Platelets: 208 10*3/uL (ref 150.0–400.0)
RBC: 4.04 Mil/uL (ref 3.87–5.11)
RDW: 13.3 % (ref 11.5–15.5)
WBC: 3.9 10*3/uL — ABNORMAL LOW (ref 4.0–10.5)

## 2023-06-27 LAB — TSH: TSH: 3.43 u[IU]/mL (ref 0.35–5.50)

## 2023-06-27 LAB — VITAMIN D 25 HYDROXY (VIT D DEFICIENCY, FRACTURES): VITD: 26.4 ng/mL — ABNORMAL LOW (ref 30.00–100.00)

## 2023-06-27 MED ORDER — PROGESTERONE MICRONIZED 100 MG PO CAPS: 100.0000 mg | ORAL_CAPSULE | Freq: Every day | ORAL | 3 refills | Status: DC

## 2023-06-28 LAB — HEPATITIS C ANTIBODY: Hepatitis C Ab: NONREACTIVE

## 2023-06-29 DIAGNOSIS — H903 Sensorineural hearing loss, bilateral: Secondary | ICD-10-CM | POA: Diagnosis not present

## 2023-06-30 ENCOUNTER — Other Ambulatory Visit (HOSPITAL_BASED_OUTPATIENT_CLINIC_OR_DEPARTMENT_OTHER): Payer: Self-pay

## 2023-06-30 ENCOUNTER — Ambulatory Visit (HOSPITAL_BASED_OUTPATIENT_CLINIC_OR_DEPARTMENT_OTHER)
Admission: RE | Admit: 2023-06-30 | Discharge: 2023-06-30 | Disposition: A | Discharge status: Home / Self Care | Payer: PPO | Source: Ambulatory Visit | Attending: Family Medicine | Admitting: Family Medicine

## 2023-06-30 DIAGNOSIS — R635 Abnormal weight gain: Secondary | ICD-10-CM

## 2023-06-30 DIAGNOSIS — R7303 Prediabetes: Secondary | ICD-10-CM

## 2023-06-30 MED ORDER — INFLUENZA VAC A&B SURF ANT ADJ 0.5 ML IM SUSY
0.5000 mL | PREFILLED_SYRINGE | Freq: Once | INTRAMUSCULAR | 0 refills | Status: AC
  Filled 2023-06-30: qty 0.5, 1d supply, fill #0

## 2023-07-05 ENCOUNTER — Encounter: Payer: Self-pay | Admitting: Family Medicine

## 2023-07-05 DIAGNOSIS — R931 Abnormal findings on diagnostic imaging of heart and coronary circulation: Secondary | ICD-10-CM

## 2023-07-12 ENCOUNTER — Encounter: Payer: Self-pay | Admitting: Family Medicine

## 2023-07-18 DIAGNOSIS — Z1231 Encounter for screening mammogram for malignant neoplasm of breast: Secondary | ICD-10-CM | POA: Diagnosis not present

## 2023-07-18 LAB — HM MAMMOGRAPHY

## 2023-07-19 ENCOUNTER — Encounter: Payer: Self-pay | Admitting: Family Medicine

## 2023-07-19 DIAGNOSIS — H353122 Nonexudative age-related macular degeneration, left eye, intermediate dry stage: Secondary | ICD-10-CM | POA: Diagnosis not present

## 2023-07-19 DIAGNOSIS — H353132 Nonexudative age-related macular degeneration, bilateral, intermediate dry stage: Secondary | ICD-10-CM | POA: Diagnosis not present

## 2023-07-19 DIAGNOSIS — H353112 Nonexudative age-related macular degeneration, right eye, intermediate dry stage: Secondary | ICD-10-CM | POA: Diagnosis not present

## 2023-07-19 DIAGNOSIS — H43812 Vitreous degeneration, left eye: Secondary | ICD-10-CM | POA: Diagnosis not present

## 2023-07-19 DIAGNOSIS — D3132 Benign neoplasm of left choroid: Secondary | ICD-10-CM | POA: Diagnosis not present

## 2023-07-19 DIAGNOSIS — H35723 Serous detachment of retinal pigment epithelium, bilateral: Secondary | ICD-10-CM | POA: Diagnosis not present

## 2023-07-19 DIAGNOSIS — H353231 Exudative age-related macular degeneration, bilateral, with active choroidal neovascularization: Secondary | ICD-10-CM | POA: Diagnosis not present

## 2023-07-20 ENCOUNTER — Other Ambulatory Visit: Payer: Self-pay | Admitting: Family Medicine

## 2023-07-21 ENCOUNTER — Ambulatory Visit: Payer: PPO | Admitting: Psychiatry

## 2023-07-28 MED ORDER — ROSUVASTATIN CALCIUM 20 MG PO TABS: 20.0000 mg | ORAL_TABLET | Freq: Every day | ORAL | 3 refills | Status: DC

## 2023-08-05 ENCOUNTER — Ambulatory Visit (INDEPENDENT_AMBULATORY_CARE_PROVIDER_SITE_OTHER): Payer: PPO | Admitting: Psychiatry

## 2023-08-05 ENCOUNTER — Encounter: Payer: Self-pay | Admitting: Psychiatry

## 2023-08-05 DIAGNOSIS — F5101 Primary insomnia: Secondary | ICD-10-CM | POA: Diagnosis not present

## 2023-08-05 DIAGNOSIS — F411 Generalized anxiety disorder: Secondary | ICD-10-CM

## 2023-08-05 DIAGNOSIS — F3342 Major depressive disorder, recurrent, in full remission: Secondary | ICD-10-CM | POA: Diagnosis not present

## 2023-08-05 MED ORDER — DOXEPIN HCL 100 MG PO CAPS
100.0000 mg | ORAL_CAPSULE | Freq: Every day | ORAL | 1 refills | Status: DC
Start: 2023-08-05 — End: 2024-02-20

## 2023-08-05 MED ORDER — VENLAFAXINE HCL ER 75 MG PO CP24
75.0000 mg | ORAL_CAPSULE | Freq: Every day | ORAL | 1 refills | Status: DC
Start: 2023-08-05 — End: 2023-09-16

## 2023-08-05 MED ORDER — ZOLPIDEM TARTRATE ER 6.25 MG PO TBCR
6.2500 mg | EXTENDED_RELEASE_TABLET | Freq: Every evening | ORAL | 1 refills | Status: DC | PRN
Start: 2023-08-30 — End: 2024-02-20

## 2023-08-05 MED ORDER — ALPRAZOLAM 0.5 MG PO TABS: 0.5000 mg | ORAL_TABLET | Freq: Two times a day (BID) | ORAL | 1 refills | Status: DC | PRN

## 2023-08-05 NOTE — Progress Notes (Signed)
Angie Liu [id number] 06/22/1946 77 y.o.  Subjective:   Patient ID:  Angie Liu is a 77 y.o. (DOB 11-03-1945) female.  Chief Complaint:  Chief Complaint  Patient presents with   Depression   Anxiety   Sleeping Problem    HPI Angie Liu presents to the office today for follow-up of depression, anxiety, and insomnia. She reports some variability in mood. She reports that she has been feeling anxious today. She reports that depression is her primary concern. She reports that this summer she had more responsibilities for music at church. She reports that she had to communicate to others how they could help her and things that were not as helpful. She reports "the depression got worse" during this period. She reports that her energy and motivation have been lower. She has lost interest in some of her activities. She reports that she has had difficulty sleeping. She reports difficulty with concentration and focus, to include some challenges with playing music at church, which then causes her anxiety. She reports that there are moments where she enjoys things. Appetite has been "different." She reports that she has been trying to increase protein intake to help with glucose levels. She reports that she has been reducing portion sizes and has lost some weight. Denies SI.   Learned that neighbor has cancer.   Some anxiey with driving.  She has been adjusting to new hearing aides.  Alprazolam last filled 06/13/23 for 90 days.  Ambien CR last filled 06/07/23.  Past Psychotropic Medication Trials: Lyrica Alprazolam  Doxepin- Has been ineffective at 25 mg. Noticed some slight improvement in sleep initiation and maintenance with increase to 50 mg po QHS. Started 25 mg on 09/26/17.  Hydroxyzine- Ineffective Xanax Ambien CR- Has been most helpful Ambien Sonata Rozerem- Ineffective Belsomra-partially effective.  Trileptal- Took for several years Remeron Trazodone Bupropion Lexapro-  Helpful many years ago but did not respond more recently. Wellbutrin- Tremor Luvox- Took 50 mg po qd Prozac- took briefly and did not respond well Zoloft- "remember I didn't like it." Cymbalta- May have caused tremors Has not taken Celexa, Buspar, Effexor Abilify, Lamictal, Seroquel, Olanzapine    PHQ2-9    Flowsheet Row Office Visit from 06/27/2023 in [facility] Primary Care at [facility] Clinical Support from 03/08/2023 in [facility] Primary Care at [facility] Clinical Support from 03/05/2022 in [facility] Primary Care at [facility] Office Visit from 01/21/2022 in [facility] Primary Care at [facility] Office Visit from 03/06/2015 in Primary Care at [facility] Total Score 4 0 0 0 0  PHQ-9 Total Score 4 -- 0 0 --      Flowsheet Row ED from 08/17/2022 in [facility] Emergency Department at [facility]  C-SSRS RISK CATEGORY No Risk        Review of Systems:  Review of Systems  Respiratory:  Positive for cough.   Gastrointestinal:  Positive for constipation and diarrhea.  Musculoskeletal:  Negative for gait problem.  Allergic/Immunologic: Positive for environmental allergies.  Psychiatric/Behavioral:         Please refer to HPI    Medications: I have reviewed the patient's current medications.  Current Outpatient Medications  Medication Sig Dispense Refill   alendronate (FOSAMAX) 70 MG tablet Take 1 tablet (70 mg total) by mouth every 7 (seven) days. Take with a full glass of water on an empty stomach. 12 tablet 3   b complex vitamins  capsule Take 1 capsule by mouth daily.     fexofenadine (ALLEGRA) 180 MG tablet Take 180 mg by mouth daily.     Prenatal Vit-Fe Fumarate-FA (PRENATAL VITAMINS PO) Take by mouth.     progesterone (PROMETRIUM) 100 MG capsule Take 1 capsule (100 mg total) by mouth daily. 90 capsule 3   rosuvastatin (CRESTOR) 20 MG tablet Take 1 tablet (20 mg total) by mouth daily. 90  tablet 3   [START ON 09/05/2023] ALPRAZolam (XANAX) 0.5 MG tablet Take 1 tablet (0.5 mg total) by mouth 2 (two) times daily as needed for anxiety. 180 tablet 1   doxepin (SINEQUAN) 100 MG capsule Take 1 capsule (100 mg total) by mouth at bedtime. 90 capsule 1   Multiple Vitamins-Minerals (PRESERVISION AREDS 2 PO) Take by mouth.     OVER THE COUNTER MEDICATION 3 capsules daily. Calm CP, takes 1 tab TID     OVER THE COUNTER MEDICATION Alpha gabba     venlafaxine XR (EFFEXOR XR) 75 MG 24 hr capsule Take 1 capsule (75 mg total) by mouth daily with breakfast. 30 capsule 1   [START ON 08/30/2023] zolpidem (AMBIEN CR) 6.25 MG CR tablet Take 1 tablet (6.25 mg total) by mouth at bedtime as needed. 90 tablet 1   No current facility-administered medications for this visit.    Medication Side Effects: None  Allergies:  Allergies  Allergen Reactions   Dust Mite Extract    Latex Rash    Past Medical History:  Diagnosis Date   Anxiety    Depression    Hayfever     Past Medical History, Surgical history, Social history, and Family history were reviewed and updated as appropriate.   Please see review of systems for further details on the patient's review from today.   Objective:   Physical Exam:  Wt 170 lb (77.1 kg)   BMI 27.44 kg/m   Physical Exam Constitutional:      General: She is not in acute distress. Musculoskeletal:        General: No deformity.  Neurological:     Mental Status: She is alert and oriented to person, place, and time.     Coordination: Coordination normal.  Psychiatric:        Attention and Perception: Attention and perception normal. She does not perceive auditory or visual hallucinations.        Mood and Affect: Mood is anxious and depressed. Affect is not labile, blunt, angry or inappropriate.        Speech: Speech normal.        Behavior: Behavior normal.        Thought Content: Thought content normal. Thought content is not paranoid or delusional. Thought  content does not include homicidal or suicidal ideation. Thought content does not include homicidal or suicidal plan.        Cognition and Memory: Cognition and memory normal.        Judgment: Judgment normal.     Comments: Insight intact     Lab Review:     Component Value Date/Time   NA 140 06/27/2023 0910   NA 143 05/25/2021 1517   K 4.5 06/27/2023 0910   CL 106 06/27/2023 0910   CO2 25 06/27/2023 0910   GLUCOSE 107 (H) 06/27/2023 0910   BUN 15 06/27/2023 0910   BUN 16 05/25/2021 1517   CREATININE 0.76 06/27/2023 0910   CALCIUM 8.7 06/27/2023 0910   PROT 6.0 06/27/2023 0910   PROT 6.7 05/25/2021 1517  ALBUMIN 4.1 06/27/2023 0910   ALBUMIN 4.8 (H) 05/25/2021 1517   AST 21 06/27/2023 0910   ALT 21 06/27/2023 0910   ALKPHOS 72 06/27/2023 0910   BILITOT 0.5 06/27/2023 0910   BILITOT 0.2 05/25/2021 1517   GFRNONAA >60 08/17/2022 1303       Component Value Date/Time   WBC 3.9 (L) 06/27/2023 0910   RBC 4.04 06/27/2023 0910   HGB 13.8 06/27/2023 0910   HGB 13.6 05/25/2021 1517   HCT 41.4 06/27/2023 0910   HCT 39.5 05/25/2021 1517   PLT 208.0 06/27/2023 0910   PLT 244 05/25/2021 1517   MCV 102.6 (H) 06/27/2023 0910   MCV 99 (H) 05/25/2021 1517   MCH 32.9 08/17/2022 1303   MCHC 33.2 06/27/2023 0910   RDW 13.3 06/27/2023 0910   RDW 14.6 05/25/2021 1517   LYMPHSABS 1.4 08/17/2022 1303   LYMPHSABS 0.9 05/25/2021 1517   MONOABS 0.5 08/17/2022 1303   EOSABS 0.7 (H) 08/17/2022 1303   EOSABS 0.1 05/25/2021 1517   BASOSABS 0.0 08/17/2022 1303   BASOSABS 0.0 05/25/2021 1517    No results found for: "POCLITH", "LITHIUM"   No results found for: "PHENYTOIN", "PHENOBARB", "VALPROATE", "CBMZ"   .res Assessment: Plan:    35 minutes spent dedicated to the care of this patient on the date of this encounter to include pre-visit review of records, ordering of medication, post visit documentation, and face-to-face time with the patient discussing recent depressive symptoms,  anxiety, and sleep disturbance. Discussed potential benefits, risks, and side effects of increasing Effexor XR to 75 mg daily.  The patient is in agreement with this plan. Continue doxepin 100 mg at bedtime for insomnia and depression. Continue Ambien CR 6.25 mg at bedtime as needed for insomnia. Continue alprazolam 0.5 mg twice daily as needed for anxiety. Pt to follow-up with this provider in 6 weeks or sooner if clinically indicated.  Patient advised to contact office with any questions, adverse effects, or acute worsening in signs and symptoms.   Angie Liu was seen today for depression, anxiety and sleeping problem.  Diagnoses and all orders for this visit:  Generalized anxiety disorder -     ALPRAZolam (XANAX) 0.5 MG tablet; Take 1 tablet (0.5 mg total) by mouth 2 (two) times daily as needed for anxiety. -     venlafaxine XR (EFFEXOR XR) 75 MG 24 hr capsule; Take 1 capsule (75 mg total) by mouth daily with breakfast.  Primary insomnia -     zolpidem (AMBIEN CR) 6.25 MG CR tablet; Take 1 tablet (6.25 mg total) by mouth at bedtime as needed. -     doxepin (SINEQUAN) 100 MG capsule; Take 1 capsule (100 mg total) by mouth at bedtime.  Recurrent major depressive disorder, in full remission (HCC) -     doxepin (SINEQUAN) 100 MG capsule; Take 1 capsule (100 mg total) by mouth at bedtime. -     venlafaxine XR (EFFEXOR XR) 75 MG 24 hr capsule; Take 1 capsule (75 mg total) by mouth daily with breakfast.     Please see After Visit Summary for patient specific instructions.  Future Appointments  Date Time Provider Department Center  09/16/2023  1:30 PM Corie Chiquito, PMHNP [facility] None  09/20/2023  2:00 PM Rollene Rotunda, MD [facility] None    No orders of the defined types were placed in this encounter.   -------------------------------

## 2023-08-23 DIAGNOSIS — H35723 Serous detachment of retinal pigment epithelium, bilateral: Secondary | ICD-10-CM | POA: Diagnosis not present

## 2023-08-23 DIAGNOSIS — H353112 Nonexudative age-related macular degeneration, right eye, intermediate dry stage: Secondary | ICD-10-CM | POA: Diagnosis not present

## 2023-08-23 DIAGNOSIS — H43812 Vitreous degeneration, left eye: Secondary | ICD-10-CM | POA: Diagnosis not present

## 2023-08-23 DIAGNOSIS — D3132 Benign neoplasm of left choroid: Secondary | ICD-10-CM | POA: Diagnosis not present

## 2023-08-23 DIAGNOSIS — H353221 Exudative age-related macular degeneration, left eye, with active choroidal neovascularization: Secondary | ICD-10-CM | POA: Diagnosis not present

## 2023-08-23 DIAGNOSIS — H353122 Nonexudative age-related macular degeneration, left eye, intermediate dry stage: Secondary | ICD-10-CM | POA: Diagnosis not present

## 2023-08-23 DIAGNOSIS — H353211 Exudative age-related macular degeneration, right eye, with active choroidal neovascularization: Secondary | ICD-10-CM | POA: Diagnosis not present

## 2023-09-01 ENCOUNTER — Ambulatory Visit: Payer: PPO | Admitting: Psychiatry

## 2023-09-05 ENCOUNTER — Encounter: Payer: Self-pay | Admitting: Family Medicine

## 2023-09-14 ENCOUNTER — Encounter: Payer: Self-pay | Admitting: Psychiatry

## 2023-09-16 ENCOUNTER — Ambulatory Visit (INDEPENDENT_AMBULATORY_CARE_PROVIDER_SITE_OTHER): Payer: PPO | Admitting: Psychiatry

## 2023-09-16 ENCOUNTER — Encounter: Payer: Self-pay | Admitting: Psychiatry

## 2023-09-16 DIAGNOSIS — F411 Generalized anxiety disorder: Secondary | ICD-10-CM | POA: Diagnosis not present

## 2023-09-16 DIAGNOSIS — F3342 Major depressive disorder, recurrent, in full remission: Secondary | ICD-10-CM

## 2023-09-16 DIAGNOSIS — F1090 Alcohol use, unspecified, uncomplicated: Secondary | ICD-10-CM

## 2023-09-16 DIAGNOSIS — F5101 Primary insomnia: Secondary | ICD-10-CM | POA: Diagnosis not present

## 2023-09-16 MED ORDER — VENLAFAXINE HCL ER 75 MG PO CP24
75.0000 mg | ORAL_CAPSULE | Freq: Every day | ORAL | 1 refills | Status: DC
Start: 2023-09-16 — End: 2023-10-11

## 2023-09-16 NOTE — Progress Notes (Signed)
Angie Liu [id number] 1946-05-03 78 y.o.  Subjective:   Patient ID:  Angie Liu is a 77 y.o. (DOB [birth year]) female.  Chief Complaint:  Chief Complaint  Patient presents with   Alcohol Problem   Follow-up    Depression, Anxiety, and insomnia    HPI Angie Liu presents to the office today for follow-up of depression, anxiety, and insomnia. Pt seen individually for approximately 10 minutes prior to her husband joining the visit.   She reports that her husband is concerned about her alcohol use. She reports that she likes to have one glass of wine at lunch and one glass with dinner, and that one glass of wine can turn into 2 glasses. She has periodically been going out to lunch and ordering wine. She reports her main motivation for drinking with is that she enjoys wine.   Husband reports that she may be drinking to relieve stress. Husband reports that on 2 occasions she has drank excessively to the point of falling and on one of these occasions she appears to have vomited. He reports that these two occasions have occurred on Mondays after he was out of the house playing tennis. Husband reports that she is less physically active recently compared to the past. She reports that her energy is good. She reports that her motivation is "ok." Denies any change in appetite. She reports that she does not eat larger meals. She reports that she has been sleeping well. She reports that her concentration is "not as good as I would like for it to be." Husband reports that she has not been enjoying reading as much. Denies SI.   She reports that she is no longer waking up with depression. She reports that she is experiencing some mild depression. She reports some diminished enjoyment in things. Husband reports that "her world is getting smaller and smaller" and she is socializing less.   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 She reports enjoying the people there.   She reports that  she occasionally goes to the gym.   She reports that she forgot her Xanax when they were out of town and slept only 30 minutes both nights, and husband confirms this.    Past Psychotropic Medication Trials: Lyrica Alprazolam  Doxepin- Has been ineffective at 25 mg. Noticed some slight improvement in sleep initiation and maintenance with increase to 50 mg po QHS. Started 25 mg on 09/26/17.  Hydroxyzine- Ineffective Xanax Ambien CR- Has been most helpful Ambien Sonata Rozerem- Ineffective Belsomra-partially effective.  Trileptal- Took for several years Remeron Trazodone Bupropion Lexapro- Helpful many years ago but did not respond more recently. Wellbutrin- Tremor Luvox- Took 50 mg po qd Prozac- took briefly and did not respond well Zoloft- "remember I didn't like it." Cymbalta- May have caused tremors Has not taken Celexa, Buspar, Effexor Abilify, Lamictal, Seroquel, Olanzapine  PHQ2-9    Flowsheet Row Office Visit from 06/27/2023 in [facility] Primary Care at [facility] Clinical Support from 03/08/2023 in [facility] Primary Care at [facility] Clinical Support from 03/05/2022 in [facility] Primary Care at [facility] Office Visit from 01/21/2022 in [facility] Primary Care at [facility] Office Visit from 03/06/2015 in Primary Care at [facility] Total Score 4 0 0 0 0  PHQ-9 Total Score 4 -- 0 0 --      Flowsheet Row ED from 08/17/2022 in [facility] Emergency Department at  [facility]  C-SSRS RISK CATEGORY No Risk        Review of Systems:  Review of Systems  Musculoskeletal:  Negative for gait problem.  Neurological:  Negative for dizziness.  Psychiatric/Behavioral:         Please refer to HPI    Medications: I have reviewed the patient's current medications.  Current Outpatient Medications  Medication Sig Dispense Refill   alendronate (FOSAMAX) 70 MG tablet Take 1 tablet (70 mg total)  by mouth every 7 (seven) days. Take with a full glass of water on an empty stomach. 12 tablet 3   ALPRAZolam (XANAX) 0.5 MG tablet Take 1 tablet (0.5 mg total) by mouth 2 (two) times daily as needed for anxiety. 180 tablet 1   b complex vitamins capsule Take 1 capsule by mouth daily.     doxepin (SINEQUAN) 100 MG capsule Take 1 capsule (100 mg total) by mouth at bedtime. 90 capsule 1   fexofenadine (ALLEGRA) 180 MG tablet Take 180 mg by mouth daily.     Multiple Vitamins-Minerals (PRESERVISION AREDS 2 PO) Take by mouth.     OVER THE COUNTER MEDICATION 3 capsules daily. Calm CP, takes 1 tab TID     OVER THE COUNTER MEDICATION Alpha gabba     Prenatal Vit-Fe Fumarate-FA (PRENATAL VITAMINS PO) Take by mouth.     progesterone (PROMETRIUM) 100 MG capsule Take 1 capsule (100 mg total) by mouth daily. 90 capsule 3   rosuvastatin (CRESTOR) 20 MG tablet Take 1 tablet (20 mg total) by mouth daily. 90 tablet 3   UNABLE TO FIND Med Name: CBD Gummies     zolpidem (AMBIEN CR) 6.25 MG CR tablet Take 1 tablet (6.25 mg total) by mouth at bedtime as needed. 90 tablet 1   venlafaxine XR (EFFEXOR XR) 75 MG 24 hr capsule Take 1 capsule (75 mg total) by mouth daily with breakfast. 90 capsule 1   No current facility-administered medications for this visit.    Medication Side Effects: None  Allergies:  Allergies  Allergen Reactions   Dust Mite Extract    Latex Rash    Past Medical History:  Diagnosis Date   Anxiety    Depression    Hayfever     Past Medical History, Surgical history, Social history, and Family history were reviewed and updated as appropriate.   Please see review of systems for further details on the patient's review from today.   Objective:   Physical Exam:  There were no vitals taken for this visit.  Physical Exam Constitutional:      General: She is not in acute distress. Musculoskeletal:        General: No deformity.  Neurological:     Mental Status: She is alert and  oriented to person, place, and time.     Coordination: Coordination normal.  Psychiatric:        Attention and Perception: Attention and perception normal. She does not perceive auditory or visual hallucinations.        Mood and Affect: Mood normal. Mood is not anxious or depressed. Affect is not labile, blunt, angry or inappropriate.        Speech: Speech normal.        Behavior: Behavior normal.        Thought Content: Thought content normal. Thought content is not paranoid or delusional. Thought content does not include homicidal or suicidal ideation. Thought content does not include homicidal or suicidal plan.  Cognition and Memory: Cognition and memory normal.     Comments: Insight and judgment are fair     Lab Review:     Component Value Date/Time   NA 140 06/27/2023 0910   NA 143 05/25/2021 1517   K 4.5 06/27/2023 0910   CL 106 06/27/2023 0910   CO2 25 06/27/2023 0910   GLUCOSE 107 (H) 06/27/2023 0910   BUN 15 06/27/2023 0910   BUN 16 05/25/2021 1517   CREATININE 0.76 06/27/2023 0910   CALCIUM 8.7 06/27/2023 0910   PROT 6.0 06/27/2023 0910   PROT 6.7 05/25/2021 1517   ALBUMIN 4.1 06/27/2023 0910   ALBUMIN 4.8 (H) 05/25/2021 1517   AST 21 06/27/2023 0910   ALT 21 06/27/2023 0910   ALKPHOS 72 06/27/2023 0910   BILITOT 0.5 06/27/2023 0910   BILITOT 0.2 05/25/2021 1517   GFRNONAA >60 08/17/2022 1303       Component Value Date/Time   WBC 3.9 (L) 06/27/2023 0910   RBC 4.04 06/27/2023 0910   HGB 13.8 06/27/2023 0910   HGB 13.6 05/25/2021 1517   HCT 41.4 06/27/2023 0910   HCT 39.5 05/25/2021 1517   PLT 208.0 06/27/2023 0910   PLT 244 05/25/2021 1517   MCV 102.6 (H) 06/27/2023 0910   MCV 99 (H) 05/25/2021 1517   MCH 32.9 08/17/2022 1303   MCHC 33.2 06/27/2023 0910   RDW 13.3 06/27/2023 0910   RDW 14.6 05/25/2021 1517   LYMPHSABS 1.4 08/17/2022 1303   LYMPHSABS 0.9 05/25/2021 1517   MONOABS 0.5 08/17/2022 1303   EOSABS 0.7 (H) 08/17/2022 1303   EOSABS 0.1  05/25/2021 1517   BASOSABS 0.0 08/17/2022 1303   BASOSABS 0.0 05/25/2021 1517    No results found for: "POCLITH", "LITHIUM"   No results found for: "PHENYTOIN", "PHENOBARB", "VALPROATE", "CBMZ"   .res Assessment: Plan:    55 minutes spent dedicated to the care of this patient on the date of this encounter to include pre-visit review of records, ordering of medication, post visit documentation, and face-to-face time with the patient and her husband discussing concerns about combing alcohol and CBD with Ambien and Alprazolam since these substances can intensify the effects of one another, leading to increased risk of falls and central nervous system depression. Discussed option of continuing alcohol use and discontinuing Ambien CR, or abstaining from alcohol and continuing current medications. Pt asks about continuing small amounts of alcohol with current medications. Discussed that she has tried to reduce alcohol in the past and that alcohol use has initially seemed to be minimal and then escalated over time, and therefore recommendation is to abstain from alcohol entirely if continuing current medications. Pt agrees to abstain from alcohol. She also agrees that husband may contact provider's office if she resumes alcohol use, so that medications can be adjusted accordingly.  Will continue current medications without changes at this time.  Discussed her desire to lose weight and considering weight loss medications. Discussed considering [facility] for assistance with weight loss. Discussed that eliminating alcohol may also help with weight loss efforts and that in the past her weight has increased when she reports drinking alcohol regularly and has decreased during periods of drinking little to no alcohol.  Pt to follow-up with this provider in 4 weeks or sooner if clinically indicated.  Patient advised to contact office with any questions, adverse effects, or acute  worsening in signs and symptoms.   Taylor was seen today for alcohol problem and follow-up.  Diagnoses and all orders for this visit:  Alcohol use, unspecified, uncomplicated  Generalized anxiety disorder -     venlafaxine XR (EFFEXOR XR) 75 MG 24 hr capsule; Take 1 capsule (75 mg total) by mouth daily with breakfast.  Recurrent major depressive disorder, in full remission (HCC) -     venlafaxine XR (EFFEXOR XR) 75 MG 24 hr capsule; Take 1 capsule (75 mg total) by mouth daily with breakfast.  Primary insomnia     Please see After Visit Summary for patient specific instructions.  Future Appointments  Date Time Provider Department Center  09/20/2023  2:00 PM Rollene Rotunda, MD [facility] None  10/11/2023  9:15 AM Corie Chiquito, PMHNP [facility] None    No orders of the defined types were placed in this encounter.   -------------------------------

## 2023-09-18 DIAGNOSIS — R931 Abnormal findings on diagnostic imaging of heart and coronary circulation: Secondary | ICD-10-CM | POA: Insufficient documentation

## 2023-09-18 NOTE — Progress Notes (Unsigned)
Cardiology Office Note   Date:  09/20/2023   ID:  Angie Liu, DOB Jan 23, 1946, MRN [id number]  PCP:  Pearline Cables, MD  Cardiologist:   Rollene Rotunda, MD Referring:  Pearline Cables, MD  Chief Complaint  Patient presents with   Elevated coronary calcium      History of Present Illness: Angie Liu is a 77 y.o. female who was referred by Copland, Gwenlyn Found, MD for evaluation of elevated coronary calcium.  She is found to have a score 88 percentile 719.  She has no past cardiac history.  The patient denies any new symptoms such as chest discomfort, neck or arm discomfort. There has been no new shortness of breath, PND or orthopnea. There have been no reported palpitations, presyncope or syncope.   She does yoga and she bikes.  She does not do this to a high level but with low level of exertion that she exerts she does not have any cardiovascular symptoms.  She has not had any prior cardiovascular testing.   Past Medical History:  Diagnosis Date   Anxiety    Depression    Hayfever     Past Surgical History:  Procedure Laterality Date   CATARACT EXTRACTION W/PHACO Bilateral 2018   TONSILLECTOMY       Current Outpatient Medications  Medication Sig Dispense Refill   alendronate (FOSAMAX) 70 MG tablet Take 1 tablet (70 mg total) by mouth every 7 (seven) days. Take with a full glass of water on an empty stomach. 12 tablet 3   ALPRAZolam (XANAX) 0.5 MG tablet Take 1 tablet (0.5 mg total) by mouth 2 (two) times daily as needed for anxiety. 180 tablet 1   b complex vitamins capsule Take 1 capsule by mouth daily.     doxepin (SINEQUAN) 100 MG capsule Take 1 capsule (100 mg total) by mouth at bedtime. 90 capsule 1   fexofenadine (ALLEGRA) 180 MG tablet Take 180 mg by mouth daily.     Multiple Vitamins-Minerals (PRESERVISION AREDS 2 PO) Take by mouth.     OVER THE COUNTER MEDICATION 3 capsules daily. Calm CP, takes 1 tab TID     OVER THE COUNTER MEDICATION Alpha gabba      Prenatal Vit-Fe Fumarate-FA (PRENATAL VITAMINS PO) Take by mouth.     progesterone (PROMETRIUM) 100 MG capsule Take 1 capsule (100 mg total) by mouth daily. 90 capsule 3   rosuvastatin (CRESTOR) 20 MG tablet Take 1 tablet (20 mg total) by mouth daily. 90 tablet 3   UNABLE TO FIND as needed. Med Name: CBD Gummies     venlafaxine XR (EFFEXOR XR) 75 MG 24 hr capsule Take 1 capsule (75 mg total) by mouth daily with breakfast. 90 capsule 1   zolpidem (AMBIEN CR) 6.25 MG CR tablet Take 1 tablet (6.25 mg total) by mouth at bedtime as needed. 90 tablet 1   No current facility-administered medications for this visit.    Allergies:   Dust mite extract and Latex    Social History:  The patient  reports that she has never smoked. She has never used smokeless tobacco. She reports current alcohol use of about 2.0 standard drinks of alcohol per week. She reports that she does not use drugs.   Family History:  The patient's family history includes Cancer in her mother; Depression in her father, mother, paternal uncle, and son; Diabetes in her father.    ROS:  Please see the history of present illness.  Otherwise, review of systems are positive for insomnia, weight gain.   All other systems are reviewed and negative.    PHYSICAL EXAM: VS:  BP 122/76 (BP Location: Left Arm, Patient Position: Sitting, Cuff Size: Normal)   Pulse 78   Ht 5\' 4"  (1.626 m)   Wt 174 lb (78.9 kg)   SpO2 100%   BMI 29.87 kg/m  , BMI Body mass index is 29.87 kg/m. GENERAL:  Well appearing HEENT:  Pupils equal round and reactive, fundi not visualized, oral mucosa unremarkable NECK:  No jugular venous distention, waveform within normal limits, carotid upstroke brisk and symmetric, no bruits, no thyromegaly LYMPHATICS:  No cervical, inguinal adenopathy LUNGS:  Clear to auscultation bilaterally BACK:  No CVA tenderness CHEST:  Unremarkable HEART:  PMI not displaced or sustained,S1 and S2 within normal limits, no S3, no S4,  no clicks, no rubs, 2 out of 6 brief apical systolic murmur radiating slightly at the aortic outflow tract, no diastolic murmurs ABD:  Flat, positive bowel sounds normal in frequency in pitch, no bruits, no rebound, no guarding, no midline pulsatile mass, no hepatomegaly, no splenomegaly EXT:  2 plus pulses throughout, no edema, no cyanosis no clubbing SKIN:  No rashes no nodules NEURO:  Cranial nerves II through XII grossly intact, motor grossly intact throughout [facility]:  Cognitively intact, oriented to person place and time    EKG:  EKG Interpretation Date/Time:  Tuesday September 20 2023 14:10:19 EST Ventricular Rate:  78 PR Interval:  220 QRS Duration:  96 QT Interval:  402 QTC Calculation: 458 R Axis:   50  Text Interpretation: Sinus rhythm with 1st degree A-V block No previous ECGs available Confirmed by Rollene Rotunda ([id number]) on 09/20/2023 2:35:51 PM     Recent Labs: 06/27/2023: ALT 21; BUN 15; Creatinine, Ser 0.76; Hemoglobin 13.8; Platelets 208.0; Potassium 4.5; Sodium 140; TSH 3.43    Lipid Panel    Component Value Date/Time   CHOL 225 (H) 06/27/2023 0910   TRIG 74.0 06/27/2023 0910   HDL 81.50 06/27/2023 0910   CHOLHDL 3 06/27/2023 0910   VLDL 14.8 06/27/2023 0910   LDLCALC 128 (H) 06/27/2023 0910      Wt Readings from Last 3 Encounters:  09/20/23 174 lb (78.9 kg)  06/27/23 173 lb 9.6 oz (78.7 kg)  08/17/22 155 lb (70.3 kg)      Other studies Reviewed: Additional studies/ records that were reviewed today include: Labs. Review of the above records demonstrates:  Please see elsewhere in the note.     ASSESSMENT AND PLAN:  Elevated coronary calcium: Given this I would like to screen the patient with a POET (Plain Old Exercise Treadmill).  She also needs primary risk reduction.  Further goals of therapy will be based on the results of this testing.  Murmur: I suspect this is some mild aortic sclerosis.  I will check an echocardiogram.  Dyslipidemia: LDL is  128 but she has just been started on statin.  I agree that goals of therapy should be at least an LDL in the 70s.  We talked about a Mediterranean plant-based diet.   Current medicines are reviewed at length with the patient today.  The patient does not have concerns regarding medicines.  The following changes have been made:  no change  Labs/ tests ordered today include:   Orders Placed This Encounter  Procedures   Cardiac Stress Test: Informed Consent Details: Physician/Practitioner Attestation; Transcribe to consent form and obtain patient signature   EXERCISE TOLERANCE  TEST (ETT)   EKG 12-Lead   ECHOCARDIOGRAM COMPLETE     Disposition:   FU with with me as needed.     Signed, Rollene Rotunda, MD  09/20/2023 3:44 PM    [facility]  '

## 2023-09-20 ENCOUNTER — Encounter: Payer: Self-pay | Admitting: Cardiology

## 2023-09-20 ENCOUNTER — Ambulatory Visit: Payer: PPO | Attending: Cardiology | Admitting: Cardiology

## 2023-09-20 VITALS — BP 122/76 | HR 78 | Ht 64.0 in | Wt 174.0 lb

## 2023-09-20 DIAGNOSIS — R011 Cardiac murmur, unspecified: Secondary | ICD-10-CM | POA: Diagnosis not present

## 2023-09-20 DIAGNOSIS — I2510 Atherosclerotic heart disease of native coronary artery without angina pectoris: Secondary | ICD-10-CM

## 2023-09-20 DIAGNOSIS — R931 Abnormal findings on diagnostic imaging of heart and coronary circulation: Secondary | ICD-10-CM | POA: Diagnosis not present

## 2023-09-20 NOTE — Patient Instructions (Signed)
Medication Instructions:  Nochanges. *If you need a refill on your cardiac medications before your next appointment, please call your pharmacy*    Testing/Procedures: Your physician has requested that you have an echocardiogram. Echocardiography is a painless test that uses sound waves to create images of your heart. It provides your doctor with information about the size and shape of your heart and how well your heart's chambers and valves are working. This procedure takes approximately one hour. There are no restrictions for this procedure. Please do NOT wear cologne, perfume, aftershave, or lotions (deodorant is allowed).  [address] Please arrive 15 minutes prior to your appointment time.  Please note: We ask at that you not bring children with you during ultrasound (echo/ vascular) testing. Due to room size and safety concerns, children are not allowed in the ultrasound rooms during exams. Our front office staff cannot provide observation of children in our lobby area while testing is being conducted. An adult accompanying a patient to their appointment will only be allowed in the ultrasound room at the discretion of the ultrasound technician under special circumstances. We apologize for any inconvenience.    Will schedule be at [address] Your physician has requested that you have an exercise tolerance test.. An exercise tolerance test is a test to check how your heart works during exercise. You will need to walk on a treadmill or for this test. An electrocardiogram (ECG) will record your heartbeat when you are at rest and when you are exercising.  Please also follow instruction sheet, as given.   Do not drink or eat foods with caffeine for 24 hours before the test. (Chocolate, coffee, tea, or energy drinks) If you use an inhaler, bring it with you to the test. Do not smoke for 4 hours before the test. Wear comfortable shoes and clothing.     Follow-Up: At [facility], you and your health needs are our priority.  As part of our continuing mission to provide you with exceptional heart care, we have created designated Provider Care Teams.  These Care Teams include your primary Cardiologist (physician) and Advanced Practice Providers (APPs -  Physician Assistants and Nurse Practitioners) who all work together to provide you with the care you need, when you need it.   Your next appointment:    As needed.   Provider:   Rollene Rotunda, MD

## 2023-09-21 ENCOUNTER — Encounter (INDEPENDENT_AMBULATORY_CARE_PROVIDER_SITE_OTHER): Payer: PPO | Admitting: Family Medicine

## 2023-09-21 DIAGNOSIS — E663 Overweight: Secondary | ICD-10-CM

## 2023-09-22 ENCOUNTER — Encounter: Payer: Self-pay | Admitting: Family Medicine

## 2023-09-22 DIAGNOSIS — E663 Overweight: Secondary | ICD-10-CM

## 2023-09-22 NOTE — Telephone Encounter (Signed)

## 2023-09-27 DIAGNOSIS — H43812 Vitreous degeneration, left eye: Secondary | ICD-10-CM | POA: Diagnosis not present

## 2023-09-27 DIAGNOSIS — H35723 Serous detachment of retinal pigment epithelium, bilateral: Secondary | ICD-10-CM | POA: Diagnosis not present

## 2023-09-27 DIAGNOSIS — H353221 Exudative age-related macular degeneration, left eye, with active choroidal neovascularization: Secondary | ICD-10-CM | POA: Diagnosis not present

## 2023-09-27 DIAGNOSIS — H353132 Nonexudative age-related macular degeneration, bilateral, intermediate dry stage: Secondary | ICD-10-CM | POA: Diagnosis not present

## 2023-09-27 DIAGNOSIS — D3132 Benign neoplasm of left choroid: Secondary | ICD-10-CM | POA: Diagnosis not present

## 2023-09-27 DIAGNOSIS — H353211 Exudative age-related macular degeneration, right eye, with active choroidal neovascularization: Secondary | ICD-10-CM | POA: Diagnosis not present

## 2023-10-04 ENCOUNTER — Ambulatory Visit: Payer: PPO | Attending: Cardiology

## 2023-10-04 DIAGNOSIS — I2510 Atherosclerotic heart disease of native coronary artery without angina pectoris: Secondary | ICD-10-CM | POA: Diagnosis not present

## 2023-10-05 LAB — EXERCISE TOLERANCE TEST
Angina Index: 0
Duke Treadmill Score: 2
Estimated workload: 4.6
Exercise duration (min): 2 min
Exercise duration (sec): 9 s
MPHR: 141 {beats}/min
Peak HR: 123 {beats}/min
Percent HR: 87 %
RPE: 14
Rest HR: 96 {beats}/min
ST Depression (mm): 0 mm

## 2023-10-11 ENCOUNTER — Encounter: Payer: Self-pay | Admitting: Psychiatry

## 2023-10-11 ENCOUNTER — Ambulatory Visit: Payer: PPO | Admitting: Psychiatry

## 2023-10-11 DIAGNOSIS — F5101 Primary insomnia: Secondary | ICD-10-CM | POA: Diagnosis not present

## 2023-10-11 DIAGNOSIS — F3341 Major depressive disorder, recurrent, in partial remission: Secondary | ICD-10-CM | POA: Diagnosis not present

## 2023-10-11 DIAGNOSIS — F3342 Major depressive disorder, recurrent, in full remission: Secondary | ICD-10-CM

## 2023-10-11 DIAGNOSIS — F411 Generalized anxiety disorder: Secondary | ICD-10-CM

## 2023-10-11 MED ORDER — VENLAFAXINE HCL ER 75 MG PO CP24: 75.0000 mg | ORAL_CAPSULE | Freq: Every day | ORAL | 1 refills | Status: DC

## 2023-10-11 NOTE — Progress Notes (Unsigned)
[name] [id number] [birth year] 77 y.o.  Subjective:   Patient ID:  Angie Liu is a 77 y.o. (DOB Jul 26, 1946) female.  Chief Complaint: No chief complaint on file.   HPI Gae Hasenauer Newman presents to the office today for follow-up of depression, anxiety, and insomnia.She reports that she had a "great" dream last night and was dreaming that she was enjoying herself and playing tennis, and then woke up "and realized I was just me." She reports periods of depression "since I was 18 or 77 years old and then I work through it." Denies chronic anxiety and reports that she has only had anxiety in a few stressful situations. She reports that her sleep "varies." She reports that she generally sleeps about 7 hours. She reports that her appetite has been "too good." She reports that she will push herself to do chores and cooking when she does not feel like it. She repots, "by the end of the day I am tired" and energy is adequate throughout the day. She reports that her concentration is adequate for playing music during church masses.  Reports that she has not been drinking alcohol. Enjoys some books. Denies SI.   Alprazolam last filled 09/12/23. Ambien CR last fill 08/28/24.   Past Psychotropic Medication Trials: Lyrica Alprazolam  Doxepin- Has been ineffective at 25 mg. Noticed some slight improvement in sleep initiation and maintenance with increase to 50 mg po QHS. Started 25 mg on 09/26/17.  Hydroxyzine- Ineffective Xanax Ambien CR- Has been most helpful Ambien Sonata Rozerem- Ineffective Belsomra-partially effective.  Trileptal- Took for several years Remeron Trazodone Bupropion Lexapro- Helpful many years ago but did not respond more recently. Wellbutrin- Tremor Luvox- Took 50 mg po qd Prozac- took briefly and did not respond well Zoloft- "remember I didn't like it." Cymbalta- May have caused tremors Has not taken Celexa, Buspar, Effexor Abilify, Lamictal, Seroquel,  Olanzapine  PHQ2-9    Flowsheet Row Office Visit from 06/27/2023 in [facility] at [facility] Clinical Support from 03/08/2023 in [facility] Primary Care at [facility] Clinical Support from 03/05/2022 in [facility] Primary Care at [facility] Office Visit from 01/21/2022 in [facility] Primary Care at [facility] Office Visit from 03/06/2015 in Primary Care at [facility] Total Score 4 0 0 0 0  PHQ-9 Total Score 4 -- 0 0 --      Flowsheet Row ED from 08/17/2022 in [facility] Emergency Department at [facility]  C-SSRS RISK CATEGORY No Risk        Review of Systems:  Review of Systems  HENT:  Positive for tinnitus.   Musculoskeletal:  Negative for gait problem.  Psychiatric/Behavioral:         Please refer to HPI    Medications: I have reviewed the patient's current medications.  Current Outpatient Medications  Medication Sig Dispense Refill   alendronate (FOSAMAX) 70 MG tablet Take 1 tablet (70 mg total) by mouth every 7 (seven) days. Take with a full glass of water on an empty stomach. 12 tablet 3   ALPRAZolam (XANAX) 0.5 MG tablet Take 1 tablet (0.5 mg total) by mouth 2 (two) times daily as needed for anxiety. 180 tablet 1   b complex vitamins capsule Take 1 capsule by mouth daily.     doxepin (SINEQUAN) 100 MG capsule Take 1 capsule (100 mg total) by mouth at bedtime. 90 capsule 1   fexofenadine (ALLEGRA) 180  MG tablet Take 180 mg by mouth daily.     Multiple Vitamins-Minerals (PRESERVISION AREDS 2 PO) Take by mouth.     OVER THE COUNTER MEDICATION 3 capsules daily. Calm CP, takes 1 tab TID     OVER THE COUNTER MEDICATION Alpha gabba     Prenatal Vit-Fe Fumarate-FA (PRENATAL VITAMINS PO) Take by mouth.     progesterone (PROMETRIUM) 100 MG capsule Take 1 capsule (100 mg total) by mouth daily. 90 capsule 3   rosuvastatin (CRESTOR) 20 MG tablet Take 1 tablet (20 mg total) by mouth daily. 90  tablet 3   UNABLE TO FIND as needed. Med Name: CBD Gummies     venlafaxine XR (EFFEXOR XR) 75 MG 24 hr capsule Take 1 capsule (75 mg total) by mouth daily with breakfast. 90 capsule 1   zolpidem (AMBIEN CR) 6.25 MG CR tablet Take 1 tablet (6.25 mg total) by mouth at bedtime as needed. 90 tablet 1   No current facility-administered medications for this visit.    Medication Side Effects: None  Allergies:  Allergies  Allergen Reactions   Dust Mite Extract    Latex Rash    Past Medical History:  Diagnosis Date   Anxiety    Depression    Hayfever     Past Medical History, Surgical history, Social history, and Family history were reviewed and updated as appropriate.   Please see review of systems for further details on the patient's review from today.   Objective:   Physical Exam:  There were no vitals taken for this visit.  Physical Exam  Lab Review:     Component Value Date/Time   NA 140 06/27/2023 0910   NA 143 05/25/2021 1517   K 4.5 06/27/2023 0910   CL 106 06/27/2023 0910   CO2 25 06/27/2023 0910   GLUCOSE 107 (H) 06/27/2023 0910   BUN 15 06/27/2023 0910   BUN 16 05/25/2021 1517   CREATININE 0.76 06/27/2023 0910   CALCIUM 8.7 06/27/2023 0910   PROT 6.0 06/27/2023 0910   PROT 6.7 05/25/2021 1517   ALBUMIN 4.1 06/27/2023 0910   ALBUMIN 4.8 (H) 05/25/2021 1517   AST 21 06/27/2023 0910   ALT 21 06/27/2023 0910   ALKPHOS 72 06/27/2023 0910   BILITOT 0.5 06/27/2023 0910   BILITOT 0.2 05/25/2021 1517   GFRNONAA >60 08/17/2022 1303       Component Value Date/Time   WBC 3.9 (L) 06/27/2023 0910   RBC 4.04 06/27/2023 0910   HGB 13.8 06/27/2023 0910   HGB 13.6 05/25/2021 1517   HCT 41.4 06/27/2023 0910   HCT 39.5 05/25/2021 1517   PLT 208.0 06/27/2023 0910   PLT 244 05/25/2021 1517   MCV 102.6 (H) 06/27/2023 0910   MCV 99 (H) 05/25/2021 1517   MCH 32.9 08/17/2022 1303   MCHC 33.2 06/27/2023 0910   RDW 13.3 06/27/2023 0910   RDW 14.6 05/25/2021 1517    LYMPHSABS 1.4 08/17/2022 1303   LYMPHSABS 0.9 05/25/2021 1517   MONOABS 0.5 08/17/2022 1303   EOSABS 0.7 (H) 08/17/2022 1303   EOSABS 0.1 05/25/2021 1517   BASOSABS 0.0 08/17/2022 1303   BASOSABS 0.0 05/25/2021 1517    No results found for: "POCLITH", "LITHIUM"   No results found for: "PHENYTOIN", "PHENOBARB", "VALPROATE", "CBMZ"   .res Assessment: Plan:    There are no diagnoses linked to this encounter.   Please see After Visit Summary for patient specific instructions.  Future Appointments  Date Time Provider Department Center  10/25/2023 10:10 AM [id number]    No orders of the defined types were placed in this encounter.   -------------------------------

## 2023-10-25 ENCOUNTER — Ambulatory Visit (HOSPITAL_COMMUNITY): Payer: PPO | Attending: Cardiology

## 2023-10-25 DIAGNOSIS — R011 Cardiac murmur, unspecified: Secondary | ICD-10-CM | POA: Diagnosis not present

## 2023-10-25 LAB — ECHOCARDIOGRAM COMPLETE
AR max vel: 1.68 cm2
AV Area VTI: 1.69 cm2
AV Area mean vel: 1.56 cm2
AV Mean grad: 13 mm[Hg]
AV Peak grad: 22 mm[Hg]
Ao pk vel: 2.34 m/s
S' Lateral: 2.73 cm

## 2023-11-14 DIAGNOSIS — D3132 Benign neoplasm of left choroid: Secondary | ICD-10-CM | POA: Diagnosis not present

## 2023-11-14 DIAGNOSIS — H43812 Vitreous degeneration, left eye: Secondary | ICD-10-CM | POA: Diagnosis not present

## 2023-11-14 DIAGNOSIS — H35723 Serous detachment of retinal pigment epithelium, bilateral: Secondary | ICD-10-CM | POA: Diagnosis not present

## 2023-11-14 DIAGNOSIS — H353231 Exudative age-related macular degeneration, bilateral, with active choroidal neovascularization: Secondary | ICD-10-CM | POA: Diagnosis not present

## 2023-11-14 DIAGNOSIS — H353132 Nonexudative age-related macular degeneration, bilateral, intermediate dry stage: Secondary | ICD-10-CM | POA: Diagnosis not present

## 2023-12-06 ENCOUNTER — Ambulatory Visit: Payer: Self-pay | Admitting: Family Medicine

## 2023-12-06 NOTE — Telephone Encounter (Signed)
 Chief Complaint: Diarrhea Symptoms: loose watery diarrhea Frequency: started early Monday morning Pertinent Negatives: Patient denies fever Disposition: [] ED /[] Urgent Care (no appt availability in office) / [x] Appointment(In office/virtual)/ []  [facility]/ [] Home Care/ [] Refused Recommended Disposition /[] [facility]/ []  Follow-up with PCP Additional Notes: patient called with concerns for episodes of diarrhea. Patient states she is have copious amounts of diarrhea but states she has had three episodes today. Patient denies abdominal pain. Endorses one episode of vomiting but hasn't had any additional vomiting. Patient is currently out of town but will be back tomorrow-would like to be seen tomorrow when she gets back into town, Appointment made for 12/07/2023 at 2:20 pm. Patient verbalized understanding of plan and all questions answerened.   Copied from CRM [id number]. Topic: Clinical - Pink Word Triage >> Dec 06, 2023  1:41 PM [name] wrote: Reason for Triage: Pt stated that she has been having diarrhea issues since Monday. Reason for Disposition  [1] MODERATE diarrhea (e.g., 4-6 times / day more than normal) AND [2] age > 70 years  Answer Assessment - Initial Assessment Questions 1. DIARRHEA SEVERITY: How bad is the diarrhea? How many more stools have you had in the past 24 hours than normal?    - NO DIARRHEA (SCALE 0)   - MILD (SCALE 1-3): Few loose or mushy BMs; increase of 1-3 stools over normal daily number of stools; mild increase in ostomy output.   -  MODERATE (SCALE 4-7): Increase of 4-6 stools daily over normal; moderate increase in ostomy output.   -  SEVERE (SCALE 8-10; OR WORST POSSIBLE): Increase of 7 or more stools daily over normal; moderate increase in ostomy output; incontinence.     3 times-Mild 2. ONSET: When did the diarrhea begin?      Started Monday morning 1 am 3. BM CONSISTENCY: How loose or watery is the diarrhea?      Some form  but a lot of liquid 4. VOMITING: Are you also vomiting? If Yes, ask: How many times in the past 24 hours?      Started with some vomiting on Monday morning-but currently no vomiting 5. ABDOMEN PAIN: Are you having any abdomen pain? If Yes, ask: What does it feel like? (e.g., crampy, dull, intermittent, constant)      No 6. ABDOMEN PAIN SEVERITY: If present, ask: How bad is the pain?  (e.g., Scale 1-10; mild, moderate, or severe)   - MILD (1-3): doesn't interfere with normal activities, abdomen soft and not tender to touch    - MODERATE (4-7): interferes with normal activities or awakens from sleep, abdomen tender to touch    - SEVERE (8-10): excruciating pain, doubled over, unable to do any normal activities       No 7. ORAL INTAKE: If vomiting, Have you been able to drink liquids? How much liquids have you had in the past 24 hours?     Able to drink 8. HYDRATION: Any signs of dehydration? (e.g., dry mouth [not just dry lips], too weak to stand, dizziness, new weight loss) When did you last urinate?     No  9. EXPOSURE: Have you traveled to a foreign country recently? Have you been exposed to anyone with diarrhea? Could you have eaten any food that was spoiled?     No 10. ANTIBIOTIC USE: Are you taking antibiotics now or have you taken antibiotics in the past 2 months?       no 11. OTHER SYMPTOMS: Do you have  any other symptoms? (e.g., fever, blood in stool)       no  Protocols used: Diarrhea-A-AH

## 2023-12-07 ENCOUNTER — Encounter: Payer: Self-pay | Admitting: Physician Assistant

## 2023-12-07 ENCOUNTER — Ambulatory Visit (INDEPENDENT_AMBULATORY_CARE_PROVIDER_SITE_OTHER): Payer: PPO | Admitting: Physician Assistant

## 2023-12-07 ENCOUNTER — Ambulatory Visit (HOSPITAL_BASED_OUTPATIENT_CLINIC_OR_DEPARTMENT_OTHER)
Admission: RE | Admit: 2023-12-07 | Discharge: 2023-12-07 | Disposition: A | Discharge status: Home / Self Care | Payer: PPO | Source: Ambulatory Visit | Attending: Physician Assistant | Admitting: Physician Assistant

## 2023-12-07 VITALS — BP 132/86 | HR 77 | Temp 97.6°F | Ht 64.0 in | Wt 177.5 lb

## 2023-12-07 DIAGNOSIS — R053 Chronic cough: Secondary | ICD-10-CM | POA: Insufficient documentation

## 2023-12-07 DIAGNOSIS — R918 Other nonspecific abnormal finding of lung field: Secondary | ICD-10-CM | POA: Diagnosis not present

## 2023-12-07 DIAGNOSIS — L989 Disorder of the skin and subcutaneous tissue, unspecified: Secondary | ICD-10-CM

## 2023-12-07 DIAGNOSIS — K529 Noninfective gastroenteritis and colitis, unspecified: Secondary | ICD-10-CM

## 2023-12-07 NOTE — Progress Notes (Signed)
 Established patient visit   Patient: Angie Liu   DOB: 1946-09-24   78 y.o. Female  MRN: [id number] Visit Date: 12/07/2023  Today's healthcare provider: Manuelita Flatness, PA-C   Chief Complaint  Patient presents with   Diarrhea    States that she had diarrhea on Monday- Started feeling a little better today. States no diarrhea today.   OTC- pedialite    Subjective     Pt reports several episodes of stool incontinence/ diarrhea/loose stools Monday middle of the night, again last night, also reports nausea with one episode of vomiting on Monday. Today, denies any diarrhea, nausea, vomiting, fever, abdominal pain, dysuria.  She did start drinking pedialyte over the counter and has taken one dose of immodium.  She also recalls a chronic, daily 'barking cough' for over 6 months. Nonproductive. Medications: Outpatient Medications Prior to Visit  Medication Sig   alendronate  (FOSAMAX ) 70 MG tablet Take 1 tablet (70 mg total) by mouth every 7 (seven) days. Take with a full glass of water on an empty stomach.   ALPRAZolam  (XANAX ) 0.5 MG tablet Take 1 tablet (0.5 mg total) by mouth 2 (two) times daily as needed for anxiety.   b complex vitamins capsule Take 1 capsule by mouth daily.   Multiple Vitamins-Minerals (PRESERVISION AREDS 2 PO) Take by mouth.   Prenatal Vit-Fe Fumarate-FA (PRENATAL VITAMINS PO) Take by mouth.   progesterone  (PROMETRIUM ) 100 MG capsule Take 1 capsule (100 mg total) by mouth daily.   rosuvastatin  (CRESTOR ) 20 MG tablet Take 1 tablet (20 mg total) by mouth daily.   venlafaxine  XR (EFFEXOR  XR) 75 MG 24 hr capsule Take 1 capsule (75 mg total) by mouth daily with breakfast.   zolpidem  (AMBIEN  CR) 6.25 MG CR tablet Take 1 tablet (6.25 mg total) by mouth at bedtime as needed.   doxepin  (SINEQUAN ) 100 MG capsule Take 1 capsule (100 mg total) by mouth at bedtime.   [DISCONTINUED] fexofenadine (ALLEGRA) 180 MG tablet Take 180 mg by mouth daily.   [DISCONTINUED] OVER  THE COUNTER MEDICATION 3 capsules daily. Calm CP, takes 1 tab TID   [DISCONTINUED] OVER THE COUNTER MEDICATION Alpha gabba   [DISCONTINUED] UNABLE TO FIND as needed. Med Name: CBD Gummies   No facility-administered medications prior to visit.    Review of Systems  Constitutional:  Negative for fatigue and fever.  Respiratory:  Negative for cough and shortness of breath.   Cardiovascular:  Negative for chest pain and leg swelling.  Gastrointestinal:  Positive for diarrhea, nausea and vomiting. Negative for abdominal pain.  Neurological:  Negative for dizziness and headaches.       Objective    BP 132/86   Pulse 77   Temp 97.6 F (36.4 C) (Oral)   Ht 5' 4 (1.626 m)   Wt 177 lb 8 oz (80.5 kg)   SpO2 97%   BMI 30.47 kg/m    Physical Exam Constitutional:      General: She is awake.     Appearance: She is well-developed.  HENT:     Head: Normocephalic.  Eyes:     Conjunctiva/sclera: Conjunctivae normal.  Cardiovascular:     Rate and Rhythm: Normal rate and regular rhythm.     Heart sounds: Normal heart sounds.  Pulmonary:     Effort: Pulmonary effort is normal.     Breath sounds: Normal breath sounds.  Abdominal:     Palpations: Abdomen is soft.     Tenderness: There is no abdominal  tenderness. There is no guarding.  Skin:    General: Skin is warm.     Comments: Right ear, scabbed, erythematous lesion  Neurological:     Mental Status: She is alert and oriented to person, place, and time.  Psychiatric:        Attention and Perception: Attention normal.        Mood and Affect: Mood normal.        Speech: Speech normal.        Behavior: Behavior is cooperative.      No results found for any visits on 12/07/23.  Assessment & Plan    Gastroenteritis - likely viral , may be norovirus. Stressed hydration.  Chronic cough - daily cough per pt for over 6 months. She states she has not told Dr Watt about this previously.  -recommending chest xray, lungs cta.  -      DG Chest 2 View 3. Skin lesion -sus for scc or basal cell, on right ear. Recommending she f/u with her dermatologist.  Return if symptoms worsen or fail to improve.       Manuelita Flatness, PA-C  [facility] Primary Care at [facility] [phone] (phone) [phone] (fax)  [facility]

## 2023-12-12 ENCOUNTER — Ambulatory Visit: Payer: PPO | Admitting: Physician Assistant

## 2023-12-12 ENCOUNTER — Encounter: Payer: Self-pay | Admitting: Physician Assistant

## 2023-12-12 DIAGNOSIS — F5101 Primary insomnia: Secondary | ICD-10-CM | POA: Diagnosis not present

## 2023-12-12 DIAGNOSIS — F411 Generalized anxiety disorder: Secondary | ICD-10-CM | POA: Diagnosis not present

## 2023-12-12 DIAGNOSIS — F3341 Major depressive disorder, recurrent, in partial remission: Secondary | ICD-10-CM | POA: Diagnosis not present

## 2023-12-12 NOTE — Progress Notes (Signed)
 [facility]  Patient ID: Angie Liu,  MRN: [id number]  PCP: Kaylee Partridge, MD  Date of Evaluation: 12/12/2023 Time spent:25 minutes  Chief Complaint:  Chief Complaint   Anxiety; Insomnia; Follow-up    HISTORY/CURRENT STATUS: HPI transferring to my care from Roselyn Connor, NP.  Angie Liu's main complaint right now is inability to sleep if she does not have her medication.  The Ambien  CR has been very helpful.  Without it she has trouble going to sleep and staying asleep.  The insomnia has been an ongoing problem for years.  She feels like the medications are all working together well and she does not want to change anything.  She reports anxiety when riding in a car.  She is unable to drive on the interstate anymore due to the anxiety.  She does not usually have panic attacks but just gets overwhelmed.  She does take the Xanax  and it is effective, but she does not want to take it and drive.  States she is not drinking as much alcohol as she had been in the past maybe just 1 glass of wine a week.  Patient is able to enjoy things.  Energy and motivation are good.   No extreme sadness, tearfulness, or feelings of hopelessness.  ADLs and personal hygiene are normal.   Denies any changes in concentration, making decisions, or remembering things.  Appetite has not changed.  Weight is stable.   Denies suicidal or homicidal thoughts.  Patient denies increased energy with decreased need for sleep, increased talkativeness, racing thoughts, impulsivity or risky behaviors, increased spending, increased libido, grandiosity, increased irritability or anger, paranoia, or hallucinations.  Denies dizziness, falls, syncope, seizures, numbness, tingling, tremor, tics, slurred speech, confusion. Denies muscle or joint pain, stiffness, or dystonia.  Individual Medical History/ Review of Systems: Changes? :Yes   had a GI bug last week.  Past Psychotropic Medication Trials: Lyrica  Alprazolam    Doxepin - Has been ineffective at 25 mg. Noticed some slight improvement in sleep initiation and maintenance with increase to 50 mg po QHS. Started 25 mg on 09/26/17.  Hydroxyzine- Ineffective Xanax  Ambien  CR- Has been most helpful Ambien  Sonata Rozerem- Ineffective Belsomra -partially effective.  Trileptal- Took for several years Remeron Trazodone Bupropion Lexapro- Helpful many years ago but did not respond more recently. Wellbutrin- Tremor Luvox- Took 50 mg po qd Prozac- took briefly and did not respond well Zoloft- "remember I didn't like it." Cymbalta- May have caused tremors Has not taken Celexa, Buspar, Effexor  Abilify, Lamictal, Seroquel, Olanzapine  Allergies: Dust mite extract and Latex  Current Medications:  Current Outpatient Medications:    alendronate  (FOSAMAX ) 70 MG tablet, Take 1 tablet (70 mg total) by mouth every 7 (seven) days. Take with a full glass of water on an empty stomach., Disp: 12 tablet, Rfl: 3   ALPRAZolam  (XANAX ) 0.5 MG tablet, Take 1 tablet (0.5 mg total) by mouth 2 (two) times daily as needed for anxiety., Disp: 180 tablet, Rfl: 1   b complex vitamins capsule, Take 1 capsule by mouth daily., Disp: , Rfl:    Multiple Vitamins-Minerals (PRESERVISION AREDS 2 PO), Take by mouth., Disp: , Rfl:    Prenatal Vit-Fe Fumarate-FA (PRENATAL VITAMINS PO), Take by mouth., Disp: , Rfl:    progesterone  (PROMETRIUM ) 100 MG capsule, Take 1 capsule (100 mg total) by mouth daily., Disp: 90 capsule, Rfl: 3   rosuvastatin  (CRESTOR ) 20 MG tablet, Take 1 tablet (20 mg total) by mouth daily., Disp: 90 tablet, Rfl: 3  venlafaxine  XR (EFFEXOR  XR) 75 MG 24 hr capsule, Take 1 capsule (75 mg total) by mouth daily with breakfast., Disp: 90 capsule, Rfl: 1   zolpidem  (AMBIEN  CR) 6.25 MG CR tablet, Take 1 tablet (6.25 mg total) by mouth at bedtime as needed., Disp: 90 tablet, Rfl: 1   doxepin  (SINEQUAN ) 100 MG capsule, Take 1 capsule (100 mg total) by mouth at bedtime., Disp: 90  capsule, Rfl: 1 Medication Side Effects: none  Family Medical/ Social History: Changes? No  MENTAL HEALTH EXAM:  There were no vitals taken for this visit.There is no height or weight on file to calculate BMI.  General Appearance: Casual and Well Groomed  Eye Contact:  Good  Speech:  Clear and Coherent and Normal Rate  Volume:  Normal  Mood:  Euthymic  Affect:  Congruent  Thought Process:  Goal Directed and Descriptions of Associations: Circumstantial  Orientation:  Full (Time, Place, and Person)  Thought Content: Logical   Suicidal Thoughts:  No  Homicidal Thoughts:  No  Memory:  WNL  Judgement:  Good  Insight:  Good  Psychomotor Activity:  Normal  Concentration:  Concentration: Good  Recall:  Good  Fund of Knowledge: Good  Language: Good  Assets:  Communication Skills Desire for Improvement Financial Resources/Insurance Housing Transportation  ADL's:  Intact  Cognition: WNL  Prognosis:  Good   DIAGNOSES:    ICD-10-CM   1. Primary insomnia  F51.01     2. Generalized anxiety disorder  F41.1     3. Recurrent major depressive disorder, in partial remission (HCC)  F33.41      Receiving Psychotherapy: No   RECOMMENDATIONS:   PDMP reviewed.  Ambien  filled 11/28/2023.  Xanax  filled 09/12/2023. I provided 25 minutes of face to face time during this encounter, including time spent before and after the visit in records review, medical decision making, counseling pertinent to today's visit, and charting.   Angie Liu is doing well on the current treatment so no changes will be made.  Continue Xanax  0.5 mg, 1 p.o. twice daily as needed. Continue doxepin  100 mg, 1 p.o. nightly. Continue Effexor  XR 75 mg, 1 p.o. daily. Continue Ambien  CR 6.25 mg, 1 p.o. nightly as needed sleep. Return in 3 months.   Marvia Slocumb, PA-C

## 2023-12-15 DIAGNOSIS — H353132 Nonexudative age-related macular degeneration, bilateral, intermediate dry stage: Secondary | ICD-10-CM | POA: Diagnosis not present

## 2023-12-15 DIAGNOSIS — D3132 Benign neoplasm of left choroid: Secondary | ICD-10-CM | POA: Diagnosis not present

## 2023-12-15 DIAGNOSIS — H35723 Serous detachment of retinal pigment epithelium, bilateral: Secondary | ICD-10-CM | POA: Diagnosis not present

## 2023-12-15 DIAGNOSIS — H43812 Vitreous degeneration, left eye: Secondary | ICD-10-CM | POA: Diagnosis not present

## 2023-12-15 DIAGNOSIS — H353231 Exudative age-related macular degeneration, bilateral, with active choroidal neovascularization: Secondary | ICD-10-CM | POA: Diagnosis not present

## 2023-12-19 ENCOUNTER — Telehealth: Payer: Self-pay

## 2023-12-19 NOTE — Telephone Encounter (Signed)
---  Caller states she has been having diarrhea off and on for about a month. States she was seen in office on February 5th and was just told to drink Pedialyte. Pt reports she vomited this morning at 1015 and had 3 episodes of diarrhea this morning. Denies fever/abd pain

## 2023-12-19 NOTE — Telephone Encounter (Signed)
 Initial Comment Caller states she has diarrhea with vomiting. Pt has urinated within the last eight hours and no blood in vomit. Translation No Nurse Assessment Nurse: Corpus, RN, Victorino Dike Date/Time (Eastern Time): 12/17/2023 12:12:45 PM Confirm and document reason for call. If symptomatic, describe symptoms. ---Caller states she has been having diarrhea off and on for about a month. States she was seen in office on February 5th and was just told to drink Pedialyte. Pt reports she vomited this morning at 1015 and had 3 episodes of diarrhea this morning. Denies fever/abd pain Does the patient have any new or worsening symptoms? ---Yes Will a triage be completed? ---Yes Related visit to physician within the last 2 weeks? ---Yes Does the PT have any chronic conditions? (i.e. diabetes, asthma, this includes High risk factors for pregnancy, etc.) ---Yes List chronic conditions. ---HLD Is this a behavioral health or substance abuse call? ---No Guidelines Guideline Title Affirmed Question Affirmed Notes Nurse Date/Time (Eastern Time) Diarrhea [1] MILD diarrhea (e.g., 1-3 or more stools than normal in past 24 hours) without known cause Corpus, RN, Victorino Dike 12/17/2023 12:17:37 PM PLEASE NOTE: All timestamps contained within this report are represented as [place] Standard Time. CONFIDENTIALTY NOTICE: This fax transmission is intended only for the addressee. It contains information that is legally privileged, confidential or otherwise protected from use or disclosure. If you are not the intended recipient, you are strictly prohibited from reviewing, disclosing, copying using or disseminating any of this information or taking any action in reliance on or regarding this information. If you have received this fax in error, please notify us immediately by telephone so that we can arrange for its return to [place]. Phone: [phone], Toll-Free: [phone], Fax:  [phone] CAROL_SOTER 07-27-1946 Page: 2 of 2 Call Id: [id number] Guidelines Guideline Title Affirmed Question Affirmed Notes Nurse Date/Time Lamount Cohen Time) AND [2] present > 7 days Disp. Time Lamount Cohen Time) Disposition Final User 12/17/2023 12:26:26 PM SEE PCP WITHIN 3 DAYS Yes Corpus, RN, Victorino Dike Final Disposition 12/17/2023 12:26:26 PM SEE PCP WITHIN 3 DAYS Yes Corpus, RN, Victorino Dike Caller Disagree/Comply Comply Caller Understands Yes PreDisposition Call Doctor Care Advice Given Per Guideline SEE PCP WITHIN 3 DAYS: * You need to be seen within 2 or 3 days. FLUID THERAPY DURING MILD TO MODERATE DIARRHEA: * Drink more fluids, at least 8 to 10 cups daily. One cup equals 8 oz (240 ml). * WATER: For mild to moderate diarrhea, water is often the best liquid to drink. You should also eat some salty foods (such as potato chips, pretzels, saltine crackers). This is important to make sure you are getting enough salt, sugars, and fluids to meet your body's needs. * SPORTS DRINKS: You can also drink half-strength sports drinks (such as Gatorade, Powerade) to help treat and prevent dehydration. Mix the sports drink half and half with water. WASH YOUR HANDS: * Wash your hands with soap and water after using the bathroom. CALL BACK IF: * Signs of dehydration occur (such as no urine over 12 hours, very dry mouth, lightheaded) * Bloody stools * Constant or severe abdomen pain * You become worse CARE ADVICE given per Diarrhea (Adult) guideline. Referrals [facility]

## 2023-12-19 NOTE — Telephone Encounter (Signed)
 Called pt back 8:20 pm She was seen 2/5 with diarrhea- thought to be possible norovirus She did better for a while, but then 2 days ago vomiting and diarrhea came back Today she thought she was doing better- she tried drinking some milk in a chai tea but this triggered diarrhea again  No fever Vomiting resolved yesterday No blood in her stool  Patient does not feel that she needs to go to the ER, she would like to be seen however in clinic.  I made her an appointment to see my partner Hyman Hopes tomorrow at 9:40 AM.  Appreciate Taylor's care of this patient.  In the meantime I encouraged Marvin to drink fluids and consume simple carbs with salt such as pretzels and crackers.  BP Readings from Last 3 Encounters:  12/07/23 132/86  09/20/23 122/76  06/27/23 122/60

## 2023-12-20 ENCOUNTER — Telehealth: Payer: Self-pay | Admitting: Family Medicine

## 2023-12-20 ENCOUNTER — Ambulatory Visit (INDEPENDENT_AMBULATORY_CARE_PROVIDER_SITE_OTHER): Payer: PPO | Admitting: Family Medicine

## 2023-12-20 ENCOUNTER — Encounter: Payer: Self-pay | Admitting: Family Medicine

## 2023-12-20 VITALS — BP 103/66 | HR 83 | Ht 64.0 in | Wt 173.0 lb

## 2023-12-20 DIAGNOSIS — R112 Nausea with vomiting, unspecified: Secondary | ICD-10-CM

## 2023-12-20 DIAGNOSIS — R197 Diarrhea, unspecified: Secondary | ICD-10-CM

## 2023-12-20 LAB — CBC WITH DIFFERENTIAL/PLATELET
Basophils Absolute: 0 10*3/uL (ref 0.0–0.1)
Basophils Relative: 0.4 % (ref 0.0–3.0)
Eosinophils Absolute: 1.8 10*3/uL — ABNORMAL HIGH (ref 0.0–0.7)
Eosinophils Relative: 32 % — ABNORMAL HIGH (ref 0.0–5.0)
HCT: 41.2 % (ref 36.0–46.0)
Hemoglobin: 13.7 g/dL (ref 12.0–15.0)
Lymphocytes Relative: 20.7 % (ref 12.0–46.0)
Lymphs Abs: 1.2 10*3/uL (ref 0.7–4.0)
MCHC: 33.4 g/dL (ref 30.0–36.0)
MCV: 101.5 fL — ABNORMAL HIGH (ref 78.0–100.0)
Monocytes Absolute: 0.4 10*3/uL (ref 0.1–1.0)
Monocytes Relative: 6.5 % (ref 3.0–12.0)
Neutro Abs: 2.3 10*3/uL (ref 1.4–7.7)
Neutrophils Relative %: 40.4 % — ABNORMAL LOW (ref 43.0–77.0)
Platelets: 217 10*3/uL (ref 150.0–400.0)
RBC: 4.06 Mil/uL (ref 3.87–5.11)
RDW: 13.3 % (ref 11.5–15.5)
WBC: 5.8 10*3/uL (ref 4.0–10.5)

## 2023-12-20 LAB — COMPREHENSIVE METABOLIC PANEL
ALT: 27 U/L (ref 0–35)
AST: 29 U/L (ref 0–37)
Albumin: 4.3 g/dL (ref 3.5–5.2)
Alkaline Phosphatase: 76 U/L (ref 39–117)
BUN: 10 mg/dL (ref 6–23)
CO2: 28 meq/L (ref 19–32)
Calcium: 8.5 mg/dL (ref 8.4–10.5)
Chloride: 105 meq/L (ref 96–112)
Creatinine, Ser: 0.82 mg/dL (ref 0.40–1.20)
GFR: 68.06 mL/min (ref >60.00)
Glucose, Bld: 114 mg/dL — ABNORMAL HIGH (ref 70–99)
Potassium: 3.9 meq/L (ref 3.5–5.1)
Sodium: 140 meq/L (ref 135–145)
Total Bilirubin: 0.6 mg/dL (ref 0.2–1.2)
Total Protein: 6.2 g/dL (ref 6.0–8.3)

## 2023-12-20 MED ORDER — ONDANSETRON 8 MG PO TBDP: 8.0000 mg | ORAL_TABLET | Freq: Three times a day (TID) | ORAL | 0 refills | Status: AC | PRN

## 2023-12-20 NOTE — Progress Notes (Signed)
 Acute Office Visit  Subjective:     Patient ID: Angie Liu, female    DOB: 1946/01/02, 78 y.o.   MRN: [id number]  Chief Complaint  Patient presents with   Diarrhea    HPI Patient is in today for diarrhea.   Discussed the use of AI scribe software for clinical note transcription with the patient, who gave verbal consent to proceed.  History of Present Illness   DEYONNA FITZSIMMONS is a 78 year old female who presents with persistent diarrhea and occasional vomiting.  She has been experiencing persistent diarrhea and occasional vomiting for the past two weeks, with symptoms occurring almost daily. The diarrhea is orange-brown in color, without blood in the stool or vomit. No fever has been noted. The symptoms have been ongoing intermittently since Christmas Eve, with more consistent occurrences since the beginning of February.  Her symptoms improve when she avoids caffeine and dairy products, consuming only bland foods such as bread with butter and tea. Attempts to reintroduce foods like granola with milk result in immediate diarrhea. She has been eating very bland foods for the past two weeks, including small amounts of cereal and rice. She has not been on any recent antibiotics and denies being around anyone who is sick. She has used Imodium twice, which provided some relief. She is gluten-free and dairy-free.  In her family history, her mother died of colon cancer. She has had colonoscopies every five years, with the last one being six years ago, which showed some polyps but no cancer.           ROS All review of systems negative except what is listed in the HPI      Objective:    BP 103/66   Pulse 83   Ht 5\' 4"  (1.626 m)   Wt 173 lb (78.5 kg)   SpO2 99%   BMI 29.70 kg/m    Physical Exam Vitals reviewed.  Constitutional:      General: She is not in acute distress.    Appearance: Normal appearance.  Cardiovascular:     Rate and Rhythm: Normal rate and regular  rhythm.     Heart sounds: Normal heart sounds.  Pulmonary:     Effort: Pulmonary effort is normal.     Breath sounds: Normal breath sounds.  Abdominal:     General: Abdomen is flat. Bowel sounds are normal. There is no distension.     Palpations: There is no mass.     Tenderness: There is no abdominal tenderness. There is no guarding or rebound.  Skin:    General: Skin is warm and dry.  Neurological:     Mental Status: She is alert and oriented to person, place, and time.  Psychiatric:        Mood and Affect: Mood normal.        Behavior: Behavior normal.        Thought Content: Thought content normal.        Judgment: Judgment normal.     No results found for any visits on 12/20/23.      Assessment & Plan:   Problem List Items Addressed This Visit   None Visit Diagnoses       Diarrhea of presumed infectious origin    -  Primary   Relevant Orders   CBC with Differential/Platelet   Comprehensive metabolic panel   GI Profile, Stool, PCR     Nausea and vomiting, unspecified vomiting type  Relevant Medications   ondansetron (ZOFRAN-ODT) 8 MG disintegrating tablet      Diarrhea for 6 weeks, worsening in the last 2 weeks. Associated with occasional vomiting and abdominal cramping. No fever, blood in stool, or recent antibiotic use. Stool is brownish-orange with possible unusual odor. No significant relief with bland diet or sporadic use of Imodium. -Order stool studies including C. diff toxin, norovirus, etc. - GI Panel -Order CBC, CMP  -Consider referral to GI for further evaluation if infectious causes are ruled out and symptoms persist.  -Continue with gentle/bland diet and adequate hydration. Adding some Zofran to help with the nausea.  -Patient aware of signs/symptoms requiring further/urgent evaluation.       Meds ordered this encounter  Medications   ondansetron (ZOFRAN-ODT) 8 MG disintegrating tablet    Sig: Take 1 tablet (8 mg total) by mouth every 8  (eight) hours as needed for nausea or vomiting.    Dispense:  20 tablet    Refill:  0    Supervising Provider:   Danise Edge A [[id number]]    Return if symptoms worsen or fail to improve.  Clayborne Dana, NP

## 2023-12-20 NOTE — Telephone Encounter (Signed)
 Pt had imaging done 2/5 and would like to know if she could get those results. Please advise.

## 2023-12-21 ENCOUNTER — Encounter: Payer: Self-pay | Admitting: Physician Assistant

## 2023-12-21 NOTE — Telephone Encounter (Signed)
 Spoke w/ reading room- they are going to get the x-ray read for Pt.

## 2023-12-22 ENCOUNTER — Other Ambulatory Visit: Payer: PPO

## 2023-12-22 DIAGNOSIS — R197 Diarrhea, unspecified: Secondary | ICD-10-CM

## 2023-12-22 NOTE — Addendum Note (Signed)
 Addended by: Mervin Kung A on: 12/22/2023 12:30 PM   Modules accepted: Orders

## 2023-12-22 NOTE — Progress Notes (Signed)
 Specimen drop off

## 2023-12-24 LAB — GI PROFILE, STOOL, PCR

## 2023-12-26 ENCOUNTER — Encounter: Payer: Self-pay | Admitting: Family Medicine

## 2023-12-28 NOTE — Telephone Encounter (Signed)
 Pt called back for clarification. I reiterated Taylor's message, that she recommends following up in 1-2 weeks for an appointment. The patient stated that she doesn't think it's necessary because she is no longer having sxs. Please advise.

## 2023-12-28 NOTE — Telephone Encounter (Signed)
 No longer having symptoms. Please advise how necessary a recheck of eosinophils are.

## 2023-12-28 NOTE — Telephone Encounter (Signed)
 If symptoms have completely resolved, just make sure she has a regular follow-up with PCP in maybe 2-3 months and can recheck it then. Come back sooner if new symptoms develop.

## 2024-01-07 ENCOUNTER — Other Ambulatory Visit: Payer: Self-pay | Admitting: Family Medicine

## 2024-01-09 DIAGNOSIS — K130 Diseases of lips: Secondary | ICD-10-CM | POA: Diagnosis not present

## 2024-01-09 DIAGNOSIS — L853 Xerosis cutis: Secondary | ICD-10-CM | POA: Diagnosis not present

## 2024-01-09 DIAGNOSIS — L720 Epidermal cyst: Secondary | ICD-10-CM | POA: Diagnosis not present

## 2024-01-09 DIAGNOSIS — L570 Actinic keratosis: Secondary | ICD-10-CM | POA: Diagnosis not present

## 2024-01-09 DIAGNOSIS — Z85828 Personal history of other malignant neoplasm of skin: Secondary | ICD-10-CM | POA: Diagnosis not present

## 2024-01-19 DIAGNOSIS — H35722 Serous detachment of retinal pigment epithelium, left eye: Secondary | ICD-10-CM | POA: Diagnosis not present

## 2024-01-19 DIAGNOSIS — H353122 Nonexudative age-related macular degeneration, left eye, intermediate dry stage: Secondary | ICD-10-CM | POA: Diagnosis not present

## 2024-01-19 DIAGNOSIS — H353112 Nonexudative age-related macular degeneration, right eye, intermediate dry stage: Secondary | ICD-10-CM | POA: Diagnosis not present

## 2024-01-19 DIAGNOSIS — H353221 Exudative age-related macular degeneration, left eye, with active choroidal neovascularization: Secondary | ICD-10-CM | POA: Diagnosis not present

## 2024-01-19 DIAGNOSIS — H353211 Exudative age-related macular degeneration, right eye, with active choroidal neovascularization: Secondary | ICD-10-CM | POA: Diagnosis not present

## 2024-01-19 DIAGNOSIS — H35721 Serous detachment of retinal pigment epithelium, right eye: Secondary | ICD-10-CM | POA: Diagnosis not present

## 2024-01-19 DIAGNOSIS — H43812 Vitreous degeneration, left eye: Secondary | ICD-10-CM | POA: Diagnosis not present

## 2024-01-19 DIAGNOSIS — D3132 Benign neoplasm of left choroid: Secondary | ICD-10-CM | POA: Diagnosis not present

## 2024-02-20 ENCOUNTER — Ambulatory Visit: Admitting: Physician Assistant

## 2024-02-20 ENCOUNTER — Encounter: Payer: Self-pay | Admitting: Physician Assistant

## 2024-02-20 DIAGNOSIS — F3341 Major depressive disorder, recurrent, in partial remission: Secondary | ICD-10-CM | POA: Diagnosis not present

## 2024-02-20 DIAGNOSIS — F3342 Major depressive disorder, recurrent, in full remission: Secondary | ICD-10-CM

## 2024-02-20 DIAGNOSIS — F411 Generalized anxiety disorder: Secondary | ICD-10-CM | POA: Diagnosis not present

## 2024-02-20 DIAGNOSIS — F5101 Primary insomnia: Secondary | ICD-10-CM | POA: Diagnosis not present

## 2024-02-20 MED ORDER — ZOLPIDEM TARTRATE ER 6.25 MG PO TBCR: 6.2500 mg | EXTENDED_RELEASE_TABLET | Freq: Every evening | ORAL | 1 refills | Status: DC | PRN

## 2024-02-20 MED ORDER — DOXEPIN HCL 100 MG PO CAPS: 100.0000 mg | ORAL_CAPSULE | Freq: Every day | ORAL | 1 refills | Status: DC

## 2024-02-20 MED ORDER — VENLAFAXINE HCL ER 75 MG PO CP24: 75.0000 mg | ORAL_CAPSULE | Freq: Every day | ORAL | 1 refills | Status: DC

## 2024-02-20 MED ORDER — ALPRAZOLAM 0.5 MG PO TABS: 0.5000 mg | ORAL_TABLET | Freq: Two times a day (BID) | ORAL | 1 refills | Status: DC | PRN

## 2024-02-20 NOTE — Progress Notes (Signed)
 [facility]  Patient ID: Angie Liu,  MRN: [id number]  PCP: Kaylee Partridge, MD  Date of Evaluation: 02/20/2024 Time spent:20 minutes  Chief Complaint:  Chief Complaint   Insomnia; Anxiety; Depression; Follow-up    HISTORY/CURRENT STATUS: HPI  For routine med check.   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  Patient is able to enjoy things.  Energy and motivation are good.  No extreme sadness, tearfulness, or feelings of hopelessness.  Sleeps well, but has to take the Ambien .  ADLs and personal hygiene are normal.   Denies any changes in concentration, making decisions, or remembering things.  Appetite has not changed.  Weight is stable.  No PA but does get overwhelmed sometimes and takes the Xanax  which helps.  Denies suicidal or homicidal thoughts.  Patient denies increased energy with decreased need for sleep, increased talkativeness, racing thoughts, impulsivity or risky behaviors, increased spending, increased libido, grandiosity, increased irritability or anger, paranoia, or hallucinations.  Denies dizziness, syncope, seizures, numbness, tingling, tremor, tics, unsteady gait, slurred speech, confusion. Denies muscle or joint pain, stiffness, or dystonia.   Individual Medical History/ Review of Systems: Changes? :No      Past Psychotropic Medication Trials: Lyrica  Alprazolam   Doxepin - Has been ineffective at 25 mg. Noticed some slight improvement in sleep initiation and maintenance with increase to 50 mg po QHS. Started 25 mg on 09/26/17.  Hydroxyzine- Ineffective Xanax  Ambien  CR- Has been most helpful Ambien  Sonata Rozerem- Ineffective Belsomra -partially effective.  Trileptal- Took for several years Remeron Trazodone Bupropion Lexapro- Helpful many years ago but did not respond more recently. Wellbutrin- Tremor Luvox- Took 50 mg po qd Prozac- took briefly and did not respond well Zoloft- "remember I  didn't like it." Cymbalta- May have caused tremors Has not taken Celexa, Buspar, Effexor  Abilify, Lamictal, Seroquel, Olanzapine  Allergies: Dust mite extract and Latex  Current Medications:  Current Outpatient Medications:    alendronate  (FOSAMAX ) 70 MG tablet, take ONE tab by MOUTH every SEVEN DAYS with a full GLASS of water ON an EMPTY stomach, Disp: 12 tablet, Rfl: 0   ALPRAZolam  (XANAX ) 0.5 MG tablet, Take 1 tablet (0.5 mg total) by mouth 2 (two) times daily as needed for anxiety., Disp: 180 tablet, Rfl: 1   b complex vitamins capsule, Take 1 capsule by mouth daily., Disp: , Rfl:    doxepin  (SINEQUAN ) 100 MG capsule, Take 1 capsule (100 mg total) by mouth at bedtime., Disp: 90 capsule, Rfl: 1   Multiple Vitamins-Minerals (PRESERVISION AREDS 2 PO), Take by mouth., Disp: , Rfl:    ondansetron  (ZOFRAN -ODT) 8 MG disintegrating tablet, Take 1 tablet (8 mg total) by mouth every 8 (eight) hours as needed for nausea or vomiting., Disp: 20 tablet, Rfl: 0   Prenatal Vit-Fe Fumarate-FA (PRENATAL VITAMINS PO), Take by mouth., Disp: , Rfl:    progesterone  (PROMETRIUM ) 100 MG capsule, Take 1 capsule (100 mg total) by mouth daily., Disp: 90 capsule, Rfl: 3   rosuvastatin  (CRESTOR ) 20 MG tablet, Take 1 tablet (20 mg total) by mouth daily., Disp: 90 tablet, Rfl: 3   venlafaxine  XR (EFFEXOR  XR) 75 MG 24 hr capsule, Take 1 capsule (75 mg total) by mouth daily with breakfast., Disp: 90 capsule, Rfl: 1   zolpidem  (AMBIEN  CR) 6.25 MG CR tablet, Take 1 tablet (6.25 mg total) by mouth at bedtime as needed., Disp: 90 tablet, Rfl: 1 Medication Side Effects: none  Family Medical/ Social History: Changes? No  MENTAL  HEALTH EXAM:  There were no vitals taken for this visit.There is no height or weight on file to calculate BMI.  General Appearance: Casual and Well Groomed  Eye Contact:  Good  Speech:  Clear and Coherent and Normal Rate  Volume:  Normal  Mood:  Euthymic  Affect:  Congruent  Thought Process:   Goal Directed and Descriptions of Associations: Circumstantial  Orientation:  Full (Time, Place, and Person)  Thought Content: Logical   Suicidal Thoughts:  No  Homicidal Thoughts:  No  Memory:  WNL  Judgement:  Good  Insight:  Good  Psychomotor Activity:  Normal  Concentration:  Concentration: Good and Attention Span: Good  Recall:  Good  Fund of Knowledge: Good  Language: Good  Assets:  Communication Skills Desire for Improvement Financial Resources/Insurance Housing Transportation  ADL's:  Intact  Cognition: WNL  Prognosis:  Good   DIAGNOSES:    ICD-10-CM   1. Generalized anxiety disorder  F41.1     2. Recurrent major depressive disorder, in partial remission (HCC)  F33.41     3. Primary insomnia  F51.01      Receiving Psychotherapy: No   RECOMMENDATIONS:   PDMP reviewed.  Ambien  filled 11/28/2023.  Xanax  filled 01/06/2024. I provided 20 minutes of face to face time during this encounter, including time spent before and after the visit in records review, medical decision making, counseling pertinent to today's visit, and charting.   Doing well with her medications so no changes need to be made.   Continue Xanax  0.5 mg, 1 p.o. twice daily as needed. Continue doxepin  100 mg, 1 p.o. nightly. Continue Effexor  XR 75 mg, 1 p.o. daily. Continue Ambien  CR 6.25 mg, 1 p.o. nightly as needed sleep. Return in 6 months.   Marvia Slocumb, PA-C

## 2024-02-23 DIAGNOSIS — H353211 Exudative age-related macular degeneration, right eye, with active choroidal neovascularization: Secondary | ICD-10-CM | POA: Diagnosis not present

## 2024-02-23 DIAGNOSIS — H43812 Vitreous degeneration, left eye: Secondary | ICD-10-CM | POA: Diagnosis not present

## 2024-02-23 DIAGNOSIS — D3132 Benign neoplasm of left choroid: Secondary | ICD-10-CM | POA: Diagnosis not present

## 2024-02-23 DIAGNOSIS — H35723 Serous detachment of retinal pigment epithelium, bilateral: Secondary | ICD-10-CM | POA: Diagnosis not present

## 2024-02-23 DIAGNOSIS — H353132 Nonexudative age-related macular degeneration, bilateral, intermediate dry stage: Secondary | ICD-10-CM | POA: Diagnosis not present

## 2024-02-23 DIAGNOSIS — H353221 Exudative age-related macular degeneration, left eye, with active choroidal neovascularization: Secondary | ICD-10-CM | POA: Diagnosis not present

## 2024-02-27 DIAGNOSIS — L570 Actinic keratosis: Secondary | ICD-10-CM | POA: Diagnosis not present

## 2024-02-27 DIAGNOSIS — L400 Psoriasis vulgaris: Secondary | ICD-10-CM | POA: Diagnosis not present

## 2024-02-27 DIAGNOSIS — Z85828 Personal history of other malignant neoplasm of skin: Secondary | ICD-10-CM | POA: Diagnosis not present

## 2024-03-05 ENCOUNTER — Ambulatory Visit: Payer: PPO | Admitting: Physician Assistant

## 2024-03-29 DIAGNOSIS — H353132 Nonexudative age-related macular degeneration, bilateral, intermediate dry stage: Secondary | ICD-10-CM | POA: Diagnosis not present

## 2024-03-29 DIAGNOSIS — H353221 Exudative age-related macular degeneration, left eye, with active choroidal neovascularization: Secondary | ICD-10-CM | POA: Diagnosis not present

## 2024-03-29 DIAGNOSIS — H35723 Serous detachment of retinal pigment epithelium, bilateral: Secondary | ICD-10-CM | POA: Diagnosis not present

## 2024-03-29 DIAGNOSIS — H353211 Exudative age-related macular degeneration, right eye, with active choroidal neovascularization: Secondary | ICD-10-CM | POA: Diagnosis not present

## 2024-03-29 DIAGNOSIS — H43812 Vitreous degeneration, left eye: Secondary | ICD-10-CM | POA: Diagnosis not present

## 2024-03-29 DIAGNOSIS — D3132 Benign neoplasm of left choroid: Secondary | ICD-10-CM | POA: Diagnosis not present

## 2024-04-03 ENCOUNTER — Ambulatory Visit

## 2024-04-04 DIAGNOSIS — H524 Presbyopia: Secondary | ICD-10-CM | POA: Diagnosis not present

## 2024-04-04 DIAGNOSIS — H5010 Unspecified exotropia: Secondary | ICD-10-CM | POA: Diagnosis not present

## 2024-04-04 DIAGNOSIS — H353231 Exudative age-related macular degeneration, bilateral, with active choroidal neovascularization: Secondary | ICD-10-CM | POA: Diagnosis not present

## 2024-04-04 DIAGNOSIS — D3132 Benign neoplasm of left choroid: Secondary | ICD-10-CM | POA: Diagnosis not present

## 2024-04-04 DIAGNOSIS — Z961 Presence of intraocular lens: Secondary | ICD-10-CM | POA: Diagnosis not present

## 2024-04-06 ENCOUNTER — Other Ambulatory Visit: Payer: Self-pay | Admitting: Family Medicine

## 2024-05-01 DIAGNOSIS — H35722 Serous detachment of retinal pigment epithelium, left eye: Secondary | ICD-10-CM | POA: Diagnosis not present

## 2024-05-01 DIAGNOSIS — D3132 Benign neoplasm of left choroid: Secondary | ICD-10-CM | POA: Diagnosis not present

## 2024-05-01 DIAGNOSIS — H353122 Nonexudative age-related macular degeneration, left eye, intermediate dry stage: Secondary | ICD-10-CM | POA: Diagnosis not present

## 2024-05-01 DIAGNOSIS — H35721 Serous detachment of retinal pigment epithelium, right eye: Secondary | ICD-10-CM | POA: Diagnosis not present

## 2024-05-01 DIAGNOSIS — H353112 Nonexudative age-related macular degeneration, right eye, intermediate dry stage: Secondary | ICD-10-CM | POA: Diagnosis not present

## 2024-05-01 DIAGNOSIS — H43812 Vitreous degeneration, left eye: Secondary | ICD-10-CM | POA: Diagnosis not present

## 2024-05-01 DIAGNOSIS — H353221 Exudative age-related macular degeneration, left eye, with active choroidal neovascularization: Secondary | ICD-10-CM | POA: Diagnosis not present

## 2024-05-01 DIAGNOSIS — H353211 Exudative age-related macular degeneration, right eye, with active choroidal neovascularization: Secondary | ICD-10-CM | POA: Diagnosis not present

## 2024-05-31 DIAGNOSIS — H353122 Nonexudative age-related macular degeneration, left eye, intermediate dry stage: Secondary | ICD-10-CM | POA: Diagnosis not present

## 2024-05-31 DIAGNOSIS — H43812 Vitreous degeneration, left eye: Secondary | ICD-10-CM | POA: Diagnosis not present

## 2024-05-31 DIAGNOSIS — H353221 Exudative age-related macular degeneration, left eye, with active choroidal neovascularization: Secondary | ICD-10-CM | POA: Diagnosis not present

## 2024-05-31 DIAGNOSIS — H353211 Exudative age-related macular degeneration, right eye, with active choroidal neovascularization: Secondary | ICD-10-CM | POA: Diagnosis not present

## 2024-05-31 DIAGNOSIS — H35722 Serous detachment of retinal pigment epithelium, left eye: Secondary | ICD-10-CM | POA: Diagnosis not present

## 2024-05-31 DIAGNOSIS — D3132 Benign neoplasm of left choroid: Secondary | ICD-10-CM | POA: Diagnosis not present

## 2024-06-26 ENCOUNTER — Ambulatory Visit (INDEPENDENT_AMBULATORY_CARE_PROVIDER_SITE_OTHER)

## 2024-06-26 VITALS — Ht 64.0 in | Wt 173.0 lb

## 2024-06-26 DIAGNOSIS — Z0000 Encounter for general adult medical examination without abnormal findings: Secondary | ICD-10-CM | POA: Diagnosis not present

## 2024-06-26 DIAGNOSIS — Z1231 Encounter for screening mammogram for malignant neoplasm of breast: Secondary | ICD-10-CM

## 2024-06-26 NOTE — Patient Instructions (Addendum)
 Angie Liu , Thank you for taking time out of your busy schedule to complete your Annual Wellness Visit with me. I enjoyed our conversation and look forward to speaking with you again next year. I, as well as your care team,  appreciate your ongoing commitment to your health goals. Please review the following plan we discussed and let me know if I can assist you in the future. Your Game plan/ To Do List    Mammogram:  Please schedule after 07/17/24 with [facility], [phone]  Follow up Visits: Next Medicare AWV with our clinical staff:  06/27/25 11am, telephone.    Next Office Visit with your provider: 07/05/24 11:20am, physical.  Clinician Recommendations:  Aim for 30 minutes of exercise or brisk walking, 6-8 glasses of water, and 5 servings of fruits and vegetables each day.   You will need to get the following vaccines at your next office visit:  FLU      This is a list of the screening recommended for you and due dates:  Health Maintenance  Topic Date Due   Medicare Annual Wellness Visit  03/07/2024   Flu Shot  06/01/2024   COVID-19 Vaccine (7 - 2024-25 season) 05/01/2025*   DTaP/Tdap/Td vaccine (3 - Tdap) 07/19/2032   Pneumococcal Vaccine for age over 32  Completed   DEXA scan (bone density measurement)  Completed   Hepatitis C Screening  Completed   Zoster (Shingles) Vaccine  Completed   HPV Vaccine  Aged Out   Meningitis B Vaccine  Aged Out   Colon Cancer Screening  Discontinued  *Topic was postponed. The date shown is not the original due date.    Advanced directives: (Copy Requested) Please bring a copy of your health care power of attorney and living will to the office to be added to your chart at your convenience. You can mail to [facility] [address] [place], [place] [address] or email to [email or web address] Advance Care Planning is important because it:  [x]  Makes sure you receive the medical care that is consistent with your values, goals,  and preferences  [x]  It provides guidance to your family and loved ones and reduces their decisional burden about whether or not they are making the right decisions based on your wishes.  Follow the link provided in your after visit summary or read over the paperwork we have mailed to you to help you started getting your Advance Directives in place. If you need assistance in completing these, please reach out to us  so that we can help you!  See attachments for Preventive Care and Fall Prevention Tips.

## 2024-06-26 NOTE — Progress Notes (Signed)
 Subjective:   Angie Liu is a 78 y.o. who presents for a Medicare Wellness preventive visit.  As a reminder, Annual Wellness Visits don't include a physical exam, and some assessments may be limited, especially if this visit is performed virtually. We may recommend an in-person follow-up visit with your provider if needed.  Visit Complete: Virtual I connected with  Angie Liu on 06/26/24 by a audio enabled telemedicine application and verified that I am speaking with the correct person using two identifiers.  Patient Location: Home  Provider Location: Home Office  I discussed the limitations of evaluation and management by telemedicine. The patient expressed understanding and agreed to proceed.  Vital Signs: Because this visit was a virtual/telehealth visit, some criteria may be missing or patient reported. Any vitals not documented were not able to be obtained and vitals that have been documented are patient reported.  VideoDeclined- This patient declined Librarian, academic. Therefore the visit was completed with audio only.  Persons Participating in Visit: Patient.  AWV Questionnaire: No: Patient Medicare AWV questionnaire was not completed prior to this visit.  Cardiac Risk Factors include: advanced age (>105men, >53 women);dyslipidemia     Objective:    Today's Vitals   06/26/24 1101  Weight: 173 lb (78.5 kg)  Height: 5' 4 (1.626 m)   Body mass index is 29.7 kg/m.     06/26/2024   11:37 AM 03/08/2023    8:21 AM 08/17/2022    1:00 PM 03/05/2022    8:33 AM  Advanced Directives  Does Patient Have a Medical Advance Directive? Yes Yes Yes Yes  Type of Estate agent of [place];Living will Healthcare Power of [place];Living will Healthcare Power of [place];Living will Healthcare Power of [place];Living will  Does patient want to make changes to medical advance directive? No - Patient declined No - Patient declined     Copy of Healthcare Power of Attorney in Chart? No - copy requested No - copy requested  No - copy requested  Would patient like information on creating a medical advance directive?    No - Patient declined    Current Medications (verified) Outpatient Encounter Medications as of 06/26/2024  Medication Sig   alendronate  (FOSAMAX ) 70 MG tablet Take 1 tablet (70 mg total) by mouth once a week. Take with full glass of water on an empty stomach   ALPRAZolam  (XANAX ) 0.5 MG tablet Take 1 tablet (0.5 mg total) by mouth 2 (two) times daily as needed for anxiety.   b complex vitamins capsule Take 1 capsule by mouth daily.   doxepin  (SINEQUAN ) 100 MG capsule Take 1 capsule (100 mg total) by mouth at bedtime.   Multiple Vitamins-Minerals (PRESERVISION AREDS 2 PO) Take by mouth.   ondansetron  (ZOFRAN -ODT) 8 MG disintegrating tablet Take 1 tablet (8 mg total) by mouth every 8 (eight) hours as needed for nausea or vomiting.   Prenatal Vit-Fe Fumarate-FA (PRENATAL VITAMINS PO) Take by mouth.   progesterone  (PROMETRIUM ) 100 MG capsule Take 1 capsule (100 mg total) by mouth daily.   rosuvastatin  (CRESTOR ) 20 MG tablet Take 1 tablet (20 mg total) by mouth daily.   venlafaxine  XR (EFFEXOR  XR) 75 MG 24 hr capsule Take 1 capsule (75 mg total) by mouth daily with breakfast.   zolpidem  (AMBIEN  CR) 6.25 MG CR tablet Take 1 tablet (6.25 mg total) by mouth at bedtime as needed.   No facility-administered encounter medications on file as of 06/26/2024.    Allergies (verified) Dust  mite extract and Latex   History: Past Medical History:  Diagnosis Date   Anxiety    Depression    Hayfever    Past Surgical History:  Procedure Laterality Date   CATARACT EXTRACTION W/PHACO Bilateral 2018   TONSILLECTOMY     Family History  Problem Relation Age of Onset   Cancer Mother        colon   Depression Mother    Diabetes Father    Depression Father    Depression Son    Depression Paternal Uncle    Social History    Socioeconomic History   Marital status: Married    Spouse name: Alm   Number of children: 2   Years of education: Not on file   Highest education level: Master's degree (e.g., MA, MS, MEng, MEd, MSW, MBA)  Occupational History   Not on file  Tobacco Use   Smoking status: Never   Smokeless tobacco: Never  Substance and Sexual Activity   Alcohol use: Yes    Alcohol/week: 1.0 standard drink of alcohol    Types: 1 Glasses of wine per week    Comment: 3-4 times a week   Drug use: No   Sexual activity: Not on file  Other Topics Concern   Not on file  Social History Narrative   Live with husband   Right handed   Caffeine: 4-5 cups of coffee   Social Drivers of Health   Financial Resource Strain: Low Risk  (06/26/2024)   Overall Financial Resource Strain (CARDIA)    Difficulty of Paying Living Expenses: Not very hard  Food Insecurity: No Food Insecurity (06/26/2024)   Hunger Vital Sign    Worried About Running Out of Food in the Last Year: Never true    Ran Out of Food in the Last Year: Never true  Transportation Needs: No Transportation Needs (06/26/2024)   PRAPARE - Administrator, Civil Service (Medical): No    Lack of Transportation (Non-Medical): No  Physical Activity: Insufficiently Active (06/26/2024)   Exercise Vital Sign    Days of Exercise per Week: 4 days    Minutes of Exercise per Session: 20 min  Stress: No Stress Concern Present (06/26/2024)   [employer] of Occupational Health - Occupational Stress Questionnaire    Feeling of Stress: Not at all  Social Connections: Socially Integrated (06/26/2024)   Social Connection and Isolation Panel    Frequency of Communication with Friends and Family: Three times a week    Frequency of Social Gatherings with Friends and Family: Three times a week    Attends Religious Services: More than 4 times per year    Active Member of Clubs or Organizations: No    Attends Engineer, structural: More than 4  times per year    Marital Status: Married    Tobacco Counseling Counseling given: Not Answered    Clinical Intake:  Pre-visit preparation completed: Yes  Pain : No/denies pain     BMI - recorded: 29.7 Nutritional Status: BMI 25 -29 Overweight Nutritional Risks: None Diabetes: No  Lab Results  Component Value Date   HGBA1C 5.7 06/27/2023   HGBA1C 5.9 07/19/2022   HGBA1C 5.8 01/21/2022     How often do you need to have someone help you when you read instructions, pamphlets, or other written materials from your doctor or pharmacy?: 1 - Never What is the last grade level you completed in school?: Master's degree  Interpreter Needed?: No  Information entered  by :: Lolita Libra, CMA   Activities of Daily Living     06/26/2024   11:21 AM  In your present state of health, do you have any difficulty performing the following activities:  Hearing? 1  Vision? 0  Difficulty concentrating or making decisions? 0  Walking or climbing stairs? 0  Dressing or bathing? 0  Doing errands, shopping? 0  Preparing Food and eating ? N  Using the Toilet? N  In the past six months, have you accidently leaked urine? N  Do you have problems with loss of bowel control? N  Managing your Medications? N  Managing your Finances? N  Housekeeping or managing your Housekeeping? N    Patient Care Team: Copland, Harlene BROCKS, MD as PCP - General (Family Medicine) Lavona Agent, MD as PCP - Cardiology (Cardiology) Latisha Medford, MD as Attending Physician (Obstetrics and Gynecology) Gara Elspeth ORN, MD as Referring Physician (Psychiatry) Arlana Arnt, MD as Attending Physician (Otolaryngology) Leslee Reusing, MD as Consulting Physician (Ophthalmology)  I have updated your Care Teams any recent Medical Services you may have received from other providers in the past year.     Assessment:   This is a routine wellness examination for Angie Liu.  Hearing/Vision screen Hearing  Screening - Comments:: Has bilateral hearing loss, wears hearing aids Vision Screening - Comments:: Up to date with Christine Mccuen. Sees DR Rankin every month for injections in eyes for macular degeneration   Goals Addressed               This Visit's Progress     Patient Stated (pt-stated)        To exercise 5 days a week and 30 minutes a day.       Depression Screen     06/26/2024   11:13 AM 06/27/2023    8:53 AM 06/27/2023    8:52 AM 03/08/2023    8:24 AM 03/05/2022    8:23 AM 01/21/2022    1:08 PM 03/06/2015    3:10 PM  PHQ 2/9 Scores  PHQ - 2 Score 1 4 0 0 0 0 0  PHQ- 9 Score 6 4   0 0     Fall Risk     06/26/2024   11:06 AM 06/27/2023    8:52 AM 03/08/2023    8:22 AM 03/05/2022    8:38 AM 01/21/2022    1:07 PM  Fall Risk   Falls in the past year? 0 0 1 1 1   Number falls in past yr: 0 0 0 1 1  Injury with Fall? 0 0 0 1 1  Risk for fall due to : Impaired balance/gait No Fall Risks No Fall Risks History of fall(s);Impaired balance/gait   Follow up Education provided Falls evaluation completed Falls evaluation completed Falls prevention discussed       Data saved with a previous flowsheet row definition    MEDICARE RISK AT HOME:  Medicare Risk at Home Any stairs in or around the home?: Yes If so, are there any without handrails?: No Home free of loose throw rugs in walkways, pet beds, electrical cords, etc?: Yes Adequate lighting in your home to reduce risk of falls?: Yes Life alert?: No Use of a cane, walker or w/c?: No Grab bars in the bathroom?: Yes Shower chair or bench in shower?: No Elevated toilet seat or a handicapped toilet?: No  TIMED UP AND GO:  Was the test performed?  No  Cognitive Function: 6CIT completed  03/08/2023    8:35 AM 03/05/2022   10:19 AM 03/05/2022    8:44 AM  6CIT Screen  What Year? 0 points 0 points 0 points  What month? 0 points 0 points 0 points  What time? 0 points 0 points 0 points  Count back from 20 0 points 0 points 0  points  Months in reverse 0 points 0 points 0 points  Repeat phrase 0 points 0 points 0 points  Total Score 0 points 0 points 0 points    Immunizations Immunization History  Administered Date(s) Administered   Fluad  Quad(high Dose 65+) 07/23/2021, 07/19/2022   Fluad  Trivalent(High Dose 65+) 08/03/2019, 07/29/2020, 06/30/2023   Influenza Split 07/31/2012   PFIZER(Purple Top)SARS-COV-2 Vaccination 06/26/2020, 07/17/2020, 02/09/2021   PNEUMOCOCCAL CONJUGATE-20 01/21/2022   Pfizer Covid-19 Vaccine Bivalent Booster 84yrs & up 08/14/2021, 02/14/2023   Pfizer(Comirnaty)Fall Seasonal Vaccine 12 years and older 02/16/2023, 08/01/2023   Respiratory Syncytial Virus Vaccine,Recomb Aduvanted(Arexvy) 08/01/2023   Td 04/08/2013, 07/19/2022   Zoster Recombinant(Shingrix) 07/29/2021, 09/14/2021   Zoster, Live 12/18/2008    Screening Tests Health Maintenance  Topic Date Due   Medicare Annual Wellness (AWV)  03/07/2024   INFLUENZA VACCINE  06/01/2024   COVID-19 Vaccine (7 - 2024-25 season) 05/01/2025 (Originally 01/29/2024)   DTaP/Tdap/Td (3 - Tdap) 07/19/2032   Pneumococcal Vaccine: 50+ Years  Completed   DEXA SCAN  Completed   Hepatitis C Screening  Completed   Zoster Vaccines- Shingrix  Completed   HPV VACCINES  Aged Out   Meningococcal B Vaccine  Aged Out   Colonoscopy  Discontinued    Health Maintenance  Health Maintenance Due  Topic Date Due   Medicare Annual Wellness (AWV)  03/07/2024   INFLUENZA VACCINE  06/01/2024   Health Maintenance Items Addressed: Mammogram ordered. Declines DEXA. Will get flu and COVID vaccines in the fall.  Additional Screening:  Vision Screening: Recommended annual ophthalmology exams for early detection of glaucoma and other disorders of the eye. Would you like a referral to an eye doctor? No    Dental Screening: Recommended annual dental exams for proper oral hygiene  Community Resource Referral / Chronic Care Management: CRR required this visit?   No   CCM required this visit?  No   Plan:    I have personally reviewed and noted the following in the patient's chart:   Medical and social history Use of alcohol, tobacco or illicit drugs  Current medications and supplements including opioid prescriptions. Patient is not currently taking opioid prescriptions. Functional ability and status Nutritional status Physical activity Advanced directives List of other physicians Hospitalizations, surgeries, and ER visits in previous 12 months Vitals Screenings to include cognitive, depression, and falls Referrals and appointments  In addition, I have reviewed and discussed with patient certain preventive protocols, quality metrics, and best practice recommendations. A written personalized care plan for preventive services as well as general preventive health recommendations were provided to patient.   Lolita Libra, CMA   06/26/2024   After Visit Summary: (MyChart) Due to this being a telephonic visit, the after visit summary with patients personalized plan was offered to patient via MyChart   Notes: Nothing significant to report at this time.

## 2024-06-27 ENCOUNTER — Ambulatory Visit

## 2024-07-03 NOTE — Patient Instructions (Incomplete)
It was good to see you today, I will be in touch with your labs ?

## 2024-07-03 NOTE — Progress Notes (Deleted)
 [facility] [address] [place], [place] [address] [phone]  Date:  07/05/2024   Name:  Angie Liu   DOB:  06/24/1946   MRN:  [id number]  PCP:  Watt Harlene BROCKS, MD    Chief Complaint: No chief complaint on file.   History of Present Illness:  Angie Liu is a 78 y.o. very pleasant female patient who presents with the following:  Patient is in today for physical exam-I saw her most recently about 1 year ago History of osteoporosis, cognitive decline, macular degeneration, HOH, anxiety and depression/insomnia, mild prediabetes  She has continued to struggle with some depression, she is seen by Harlene Pepper with psychiatry  She follows up regularly with her ophthalmologist She is also monitored by dermatology Most recent labs on chart from [facility], CBC Otherwise we did complete labs for her about a year ago  Recommend flu shot, COVID booster this fall She has completed Shingrix and RSV and pneumonia Mammogram can be updated this month DEXA scan can also be updated  Fosamax , alprazolam  as needed, doxepin  at bedtime, progesterone  100 at bedtime, Crestor , venlafaxine , Ambien  as needed  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   Exudative age-related macular degeneration of right eye with active choroidal neovascularization (HCC) 06/04/2020   Exudative age-related macular degeneration of left eye with active choroidal neovascularization (HCC) 02/07/2020   Serous detachment of  retinal pigment epithelium of left eye 02/07/2020   Posterior vitreous detachment of left eye 02/07/2020   Intermediate stage nonexudative age-related macular degeneration of left eye 02/07/2020   Intermediate stage nonexudative age-related macular degeneration of right eye 02/07/2020   GAD (generalized anxiety disorder) 11/28/2018   Recurrent major depression in partial remission (HCC) 11/28/2018   Chronic insomnia 02/02/2018   Depression 02/02/2018   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   Sensorineural hearing loss (SNHL), 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   Tinnitus of both ears 10/17/2017   Seborrheic dermatitis 07/21/2017   Allergic rhinitis, seasonal 10/23/2012   Depression with anxiety 10/23/2012    Past Medical History:  Diagnosis Date   Anxiety    Depression    Hayfever     Past Surgical History:  Procedure Laterality Date   CATARACT EXTRACTION W/PHACO Bilateral 2018   TONSILLECTOMY      Social History   Tobacco Use   Smoking status: Never   Smokeless tobacco: Never  Substance Use Topics   Alcohol use: Yes    Alcohol/week: 1.0 standard drink of alcohol    Types: 1 Glasses of wine per week    Comment: 3-4 times a week   Drug use: No    Family History  Problem Relation Age of Onset   Cancer Mother        colon   Depression Mother    Diabetes Father    Depression Father    Depression Son    Depression Paternal Uncle     Allergies  Allergen Reactions   Dust Mite Extract    Latex Rash    Medication list has been reviewed  and updated.  Current Outpatient Medications on File Prior to Visit  Medication Sig Dispense Refill   alendronate  (FOSAMAX ) 70 MG tablet Take 1 tablet (70 mg total) by mouth once a week. Take with full glass of water on an empty stomach 12 tablet 0   ALPRAZolam  (XANAX ) 0.5 MG tablet Take 1 tablet (0.5 mg total) by mouth 2 (two) times daily as needed for anxiety. 180 tablet 1   b complex vitamins capsule Take 1 capsule by mouth daily.      doxepin  (SINEQUAN ) 100 MG capsule Take 1 capsule (100 mg total) by mouth at bedtime. 90 capsule 1   Multiple Vitamins-Minerals (PRESERVISION AREDS 2 PO) Take by mouth.     ondansetron  (ZOFRAN -ODT) 8 MG disintegrating tablet Take 1 tablet (8 mg total) by mouth every 8 (eight) hours as needed for nausea or vomiting. 20 tablet 0   Prenatal Vit-Fe Fumarate-FA (PRENATAL VITAMINS PO) Take by mouth.     progesterone  (PROMETRIUM ) 100 MG capsule Take 1 capsule (100 mg total) by mouth daily. 90 capsule 3   rosuvastatin  (CRESTOR ) 20 MG tablet Take 1 tablet (20 mg total) by mouth daily. 90 tablet 3   venlafaxine  XR (EFFEXOR  XR) 75 MG 24 hr capsule Take 1 capsule (75 mg total) by mouth daily with breakfast. 90 capsule 1   zolpidem  (AMBIEN  CR) 6.25 MG CR tablet Take 1 tablet (6.25 mg total) by mouth at bedtime as needed. 90 tablet 1   No current facility-administered medications on file prior to visit.    Review of Systems:  As per HPI- otherwise negative.   Physical Examination: There were no vitals filed for this visit. There were no vitals filed for this visit. There is no height or weight on file to calculate BMI. Ideal Body Weight:    GEN: no acute distress. HEENT: Atraumatic, Normocephalic.  Ears and Nose: No external deformity. CV: RRR, No M/G/R. No JVD. No thrill. No extra heart sounds. PULM: CTA B, no wheezes, crackles, rhonchi. No retractions. No resp. distress. No accessory muscle use. ABD: S, NT, ND, +BS. No rebound. No HSM. EXTR: No c/c/e PSYCH: Normally interactive. Conversant.    Assessment and Plan: No diagnosis found. Physical exam today-encouraged healthy diet and exercise routine Assessment & Plan   Signed Harlene Schroeder, MD

## 2024-07-05 ENCOUNTER — Encounter: Admitting: Family Medicine

## 2024-07-05 DIAGNOSIS — Z130 Encounter for screening for diseases of the blood and blood-forming organs and certain disorders involving the immune mechanism: Secondary | ICD-10-CM

## 2024-07-05 DIAGNOSIS — Z1329 Encounter for screening for other suspected endocrine disorder: Secondary | ICD-10-CM

## 2024-07-05 DIAGNOSIS — R7303 Prediabetes: Secondary | ICD-10-CM

## 2024-07-05 DIAGNOSIS — R931 Abnormal findings on diagnostic imaging of heart and coronary circulation: Secondary | ICD-10-CM

## 2024-07-05 DIAGNOSIS — Z0000 Encounter for general adult medical examination without abnormal findings: Secondary | ICD-10-CM

## 2024-07-06 ENCOUNTER — Other Ambulatory Visit: Payer: Self-pay | Admitting: Family Medicine

## 2024-07-11 ENCOUNTER — Other Ambulatory Visit: Payer: Self-pay | Admitting: Family Medicine

## 2024-07-11 DIAGNOSIS — F5101 Primary insomnia: Secondary | ICD-10-CM

## 2024-07-12 DIAGNOSIS — H353112 Nonexudative age-related macular degeneration, right eye, intermediate dry stage: Secondary | ICD-10-CM | POA: Diagnosis not present

## 2024-07-12 DIAGNOSIS — H353221 Exudative age-related macular degeneration, left eye, with active choroidal neovascularization: Secondary | ICD-10-CM | POA: Diagnosis not present

## 2024-07-12 DIAGNOSIS — H353211 Exudative age-related macular degeneration, right eye, with active choroidal neovascularization: Secondary | ICD-10-CM | POA: Diagnosis not present

## 2024-07-12 DIAGNOSIS — H35722 Serous detachment of retinal pigment epithelium, left eye: Secondary | ICD-10-CM | POA: Diagnosis not present

## 2024-07-12 DIAGNOSIS — D3132 Benign neoplasm of left choroid: Secondary | ICD-10-CM | POA: Diagnosis not present

## 2024-07-12 DIAGNOSIS — H353122 Nonexudative age-related macular degeneration, left eye, intermediate dry stage: Secondary | ICD-10-CM | POA: Diagnosis not present

## 2024-07-12 DIAGNOSIS — H43812 Vitreous degeneration, left eye: Secondary | ICD-10-CM | POA: Diagnosis not present

## 2024-07-12 DIAGNOSIS — H35721 Serous detachment of retinal pigment epithelium, right eye: Secondary | ICD-10-CM | POA: Diagnosis not present

## 2024-07-14 NOTE — Progress Notes (Signed)
 [facility] [address], [address] [place], [place] [address] [phone]  Date:  07/19/2024   Name:  Angie Liu   DOB:  07-22-1946   MRN:  [id number]  PCP:  Watt Harlene BROCKS, MD    Chief Complaint: No chief complaint on file.   History of Present Illness:  Angie Liu is a 78 y.o. very pleasant female patient who presents with the following:  Patient is in today for physical exam-I saw her most recently about 1 year ago History of osteoporosis, cognitive decline, macular degeneration, HOH, anxiety and depression/insomnia, mild prediabetes  She has continued to struggle with some depression, she is seen by Harlene Pepper with psychiatry  She follows up regularly with her ophthalmologist She is also monitored by dermatology Most recent labs on chart from [facility], CBC Otherwise we did complete labs for her about a year ago  Recommend flu shot, COVID booster this fall- give flu shot today  She has completed Shingrix and RSV and pneumonia Mammogram can be updated this month- this is scheduled  DEXA scan can also be updated  Fosamax , alprazolam  as needed, doxepin  at bedtime, progesterone  100 at bedtime, Crestor , venlafaxine , Ambien  as needed  Discussed the use of AI scribe software for clinical note transcription with the patient, who gave verbal consent to proceed.  History of Present Illness Angie Liu is a 78 year old female who presents for routine follow-up and vaccination updates.  She is interested in obtaining a COVID-19 booster shot.She has an upcoming mammogram appointment and is considering updating her bone density scan. She is currently on Fosamax .  She has been reducing her alcohol intake, previously consuming excessive wine, which she believes may have affected her blood work. She has significantly cut back and has more interest in activities like playing the piano. She recalls an incident of  feeling unwell after drinking too much wine alone at home.  Overall she is really pleased with her decision to reduce alcohol that might  She is taking venlafaxine  for mood, which she finds effective, and has been more active with reduced wine consumption. She is also on cholesterol medication and supplements with red yeast rice to boost her HDL levels.  She is increasing her physical activity and has started exercising more. She has not traveled much recently, mentioning a trip in May, and is dealing with timeshare ownership issues.  She is considering weight loss options if she does not lose more weight by December, aiming to lose 10 to 15 pounds, though she would like to lose 30 pounds. Her daughter, a Engineer, civil (consulting), advised that having some weight could be beneficial as she ages.    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   Exudative age-related macular degeneration of right eye with active choroidal neovascularization (HCC) 06/04/2020   Exudative age-related macular degeneration of left eye with active choroidal neovascularization (HCC) 02/07/2020   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   Posterior vitreous detachment of left eye 02/07/2020   Intermediate stage nonexudative age-related  macular degeneration of left eye 02/07/2020   Intermediate stage nonexudative age-related macular degeneration of right eye 02/07/2020   GAD (generalized anxiety disorder) 11/28/2018   Recurrent major depression in partial remission (HCC) 11/28/2018    Chronic insomnia 02/02/2018   Depression 02/02/2018   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   Sensorineural hearing loss (SNHL), 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   Tinnitus of both ears 10/17/2017   Seborrheic dermatitis 07/21/2017   Allergic rhinitis, seasonal 10/23/2012   Depression with anxiety 10/23/2012    Past Medical History:  Diagnosis Date   Anxiety    Depression    Hayfever     Past Surgical History:  Procedure Laterality Date   CATARACT EXTRACTION W/PHACO Bilateral 2018   TONSILLECTOMY      Social History   Tobacco Use   Smoking status: Never   Smokeless tobacco: Never  Substance Use Topics   Alcohol use: Yes    Alcohol/week: 1.0 standard drink of alcohol    Types: 1 Glasses of wine per week    Comment: 3-4 times a week   Drug use: No    Family History  Problem Relation Age of Onset   Cancer Mother        colon   Depression Mother    Diabetes Father    Depression Father    Depression Son    Depression Paternal Uncle     Allergies  Allergen Reactions   Dust Mite Extract    Latex Rash    Medication list has been reviewed and updated.  Current Outpatient Medications on File Prior to Visit  Medication Sig Dispense Refill   alendronate  (FOSAMAX ) 70 MG tablet Take 1 tablet (70 mg total) by mouth once a week. Take with full glass of water on an empty stomach 12 tablet 3   ALPRAZolam  (XANAX ) 0.5 MG tablet Take 1 tablet (0.5 mg total) by mouth 2 (two) times daily as needed for anxiety. 180 tablet 1   b complex vitamins capsule Take 1 capsule by mouth daily.     doxepin  (SINEQUAN ) 100 MG capsule Take 1 capsule (100 mg total) by mouth at bedtime. 90 capsule 1   Multiple Vitamins-Minerals (PRESERVISION AREDS 2 PO) Take by mouth.     ondansetron  (ZOFRAN -ODT) 8 MG disintegrating tablet Take 1 tablet (8 mg total) by mouth every 8 (eight) hours as needed for nausea or vomiting. 20 tablet 0   Prenatal Vit-Fe Fumarate-FA (PRENATAL VITAMINS PO) Take by mouth.      progesterone  (PROMETRIUM ) 100 MG capsule Take 1 capsule (100 mg total) by mouth daily. 90 capsule 0   venlafaxine  XR (EFFEXOR  XR) 75 MG 24 hr capsule Take 1 capsule (75 mg total) by mouth daily with breakfast. 90 capsule 1   zolpidem  (AMBIEN  CR) 6.25 MG CR tablet Take 1 tablet (6.25 mg total) by mouth at bedtime as needed. 90 tablet 1   No current facility-administered medications on file prior to visit.    Review of Systems:  As per HPI- otherwise negative.  BP Readings from Last 3 Encounters:  07/19/24 97/65  12/20/23 103/66  12/07/23 132/86    Physical Examination: Vitals:   07/19/24 0938  BP: 97/65  Pulse: 79   Vitals:   07/19/24 0938  Weight: 175 lb 9.6 oz (79.7 kg)  Height: 5' 4 (1.626 m)   Body mass index is 30.14 kg/m. Ideal Body Weight: Weight in (lb) to have BMI = 25: 145.3  GEN: no acute distress. Mild obesity, looks well  HEENT: Atraumatic,  Normocephalic.  Ears and Nose: No external deformity. CV: RRR, No M/G/R. No JVD. No thrill. No extra heart sounds. PULM: CTA B, no wheezes, crackles, rhonchi. No retractions. No resp. distress. No accessory muscle use. ABD: S, NT, ND, +BS. No rebound. No HSM. EXTR: No c/c/e PSYCH: Normally interactive. Conversant.  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 which patient states has been present for many years, not changing Wt Readings from Last 3 Encounters:  07/19/24 175 lb 9.6 oz (79.7 kg)  06/26/24 173 lb (78.5 kg)  12/20/23 173 lb (78.5 kg)     Assessment and Plan: Physical exam  Need for influenza vaccination - Plan: Flu vaccine HIGH DOSE PF(Fluzone Trivalent)  Breast cancer screening by mammogram  Osteoporosis, unspecified osteoporosis type, unspecified pathological fracture presence - Plan: DG Bone Density  Elevated coronary artery calcium  score - Plan: rosuvastatin  (CRESTOR ) 20 MG tablet  Screening for diabetes mellitus - Plan: Comprehensive metabolic panel with GFR, Hemoglobin A1c  Other  depression - Plan: TSH  Dyslipidemia - Plan: Lipid panel  Medication monitoring encounter - Plan: CBC, Comprehensive metabolic panel with GFR Physical exam today-encouraged healthy diet and exercise routine Assessment & Plan Adult Wellness Visit Routine wellness visit focused on health maintenance, vaccinations, and screenings. - Order blood work. - Administer flu shot. - Recommend COVID-19 booster at pharmacy. - Confirm mammogram appointment. - Order bone density scan.  Abnormal weight gain Discussed weight management and potential use of GLP-1 drugs for weight loss. Emphasized importance of not being underweight, especially in older age. - Encourage increased physical activity. - Consider GLP-1 drug through [employer] if weight loss is not achieved by December.  Hyperlipidemia Managed with cholesterol medication and red yeast rice. Previous HDL levels satisfactory. - Refill cholesterol medication. - Continue red yeast rice supplementation.  Osteoporosis Managed with Fosamax . Discussed importance of monitoring bone density to assess treatment efficacy. - Order [facility] density [facility] at [facility].  Depression Well-managed with venlafaxine . Reports improved mood and increased interest in activities. - Continue venlafaxine .  Alcohol use, reduced Significant reduction in alcohol consumption. Discussed health benefits of reducing alcohol intake. - Continue to reduce alcohol consumption.  Lipoma Small lipoma with no concerns for malignancy.  Signed Harlene Schroeder, MD  Received labs, message to patient  Results for orders placed or performed in visit on 07/19/24  CBC   Collection Time: 07/19/24  9:59 AM  Result Value Ref Range   WBC 4.2 4.0 - 10.5 K/uL   RBC 4.26 3.87 - 5.11 Mil/uL   Platelets 213.0 150.0 - 400.0 K/uL   Hemoglobin 14.2 12.0 - 15.0 g/dL   HCT 57.5 63.9 - 53.9 %   MCV 99.5 78.0 - 100.0 fl   MCHC 33.4 30.0 - 36.0 g/dL   RDW 86.4 88.4 - 84.4 %  Comprehensive  metabolic panel with GFR   Collection Time: 07/19/24  9:59 AM  Result Value Ref Range   Sodium 138 135 - 145 mEq/L   Potassium 4.2 3.5 - 5.1 mEq/L   Chloride 103 96 - 112 mEq/L   CO2 29 19 - 32 mEq/L   Glucose, Bld 100 (H) 70 - 99 mg/dL   BUN 16 6 - 23 mg/dL   Creatinine, Ser 9.24 0.40 - 1.20 mg/dL   Total Bilirubin 0.6 0.2 - 1.2 mg/dL   Alkaline Phosphatase 58 39 - 117 U/L   AST 24 0 - 37 U/L   ALT 23 0 - 35 U/L   Total Protein 6.3 6.0 -  8.3 g/dL   Albumin 4.6 3.5 - 5.2 g/dL   GFR 24.55 >39.99 mL/min   Calcium  9.0 8.4 - 10.5 mg/dL  Hemoglobin J8r   Collection Time: 07/19/24  9:59 AM  Result Value Ref Range   Hgb A1c MFr Bld 6.3 4.6 - 6.5 %  Lipid panel   Collection Time: 07/19/24  9:59 AM  Result Value Ref Range   Cholesterol 179 0 - 200 mg/dL   Triglycerides 30.9 0.0 - 149.0 mg/dL   HDL 18.09 >60.99 mg/dL   VLDL 86.1 0.0 - 59.9 mg/dL   LDL Cholesterol 83 0 - 99 mg/dL   Total CHOL/HDL Ratio 2    NonHDL 97.28   TSH   Collection Time: 07/19/24  9:59 AM  Result Value Ref Range   TSH 2.49 0.35 - 5.50 uIU/mL

## 2024-07-19 ENCOUNTER — Encounter: Payer: Self-pay | Admitting: Family Medicine

## 2024-07-19 ENCOUNTER — Ambulatory Visit (INDEPENDENT_AMBULATORY_CARE_PROVIDER_SITE_OTHER): Admitting: Family Medicine

## 2024-07-19 VITALS — BP 97/65 | HR 79 | Ht 64.0 in | Wt 175.6 lb

## 2024-07-19 DIAGNOSIS — Z0000 Encounter for general adult medical examination without abnormal findings: Secondary | ICD-10-CM | POA: Diagnosis not present

## 2024-07-19 DIAGNOSIS — Z23 Encounter for immunization: Secondary | ICD-10-CM

## 2024-07-19 DIAGNOSIS — R931 Abnormal findings on diagnostic imaging of heart and coronary circulation: Secondary | ICD-10-CM

## 2024-07-19 DIAGNOSIS — F3289 Other specified depressive episodes: Secondary | ICD-10-CM

## 2024-07-19 DIAGNOSIS — Z131 Encounter for screening for diabetes mellitus: Secondary | ICD-10-CM | POA: Diagnosis not present

## 2024-07-19 DIAGNOSIS — R7303 Prediabetes: Secondary | ICD-10-CM | POA: Diagnosis not present

## 2024-07-19 DIAGNOSIS — M810 Age-related osteoporosis without current pathological fracture: Secondary | ICD-10-CM | POA: Diagnosis not present

## 2024-07-19 DIAGNOSIS — Z5181 Encounter for therapeutic drug level monitoring: Secondary | ICD-10-CM

## 2024-07-19 DIAGNOSIS — Z1231 Encounter for screening mammogram for malignant neoplasm of breast: Secondary | ICD-10-CM | POA: Diagnosis not present

## 2024-07-19 DIAGNOSIS — E785 Hyperlipidemia, unspecified: Secondary | ICD-10-CM | POA: Diagnosis not present

## 2024-07-19 LAB — CBC
HCT: 42.4 % (ref 36.0–46.0)
Hemoglobin: 14.2 g/dL (ref 12.0–15.0)
MCHC: 33.4 g/dL (ref 30.0–36.0)
MCV: 99.5 fl (ref 78.0–100.0)
Platelets: 213 K/uL (ref 150.0–400.0)
RBC: 4.26 Mil/uL (ref 3.87–5.11)
RDW: 13.5 % (ref 11.5–15.5)
WBC: 4.2 K/uL (ref 4.0–10.5)

## 2024-07-19 LAB — LIPID PANEL
Cholesterol: 179 mg/dL (ref 0–200)
HDL: 81.9 mg/dL (ref >39.00)
LDL Cholesterol: 83 mg/dL (ref 0–99)
NonHDL: 97.28
Total CHOL/HDL Ratio: 2
Triglycerides: 69 mg/dL (ref 0.0–149.0)
VLDL: 13.8 mg/dL (ref 0.0–40.0)

## 2024-07-19 LAB — COMPREHENSIVE METABOLIC PANEL WITH GFR
ALT: 23 U/L (ref 0–35)
AST: 24 U/L (ref 0–37)
Albumin: 4.6 g/dL (ref 3.5–5.2)
Alkaline Phosphatase: 58 U/L (ref 39–117)
BUN: 16 mg/dL (ref 6–23)
CO2: 29 meq/L (ref 19–32)
Calcium: 9 mg/dL (ref 8.4–10.5)
Chloride: 103 meq/L (ref 96–112)
Creatinine, Ser: 0.75 mg/dL (ref 0.40–1.20)
GFR: 75.44 mL/min (ref >60.00)
Glucose, Bld: 100 mg/dL — ABNORMAL HIGH (ref 70–99)
Potassium: 4.2 meq/L (ref 3.5–5.1)
Sodium: 138 meq/L (ref 135–145)
Total Bilirubin: 0.6 mg/dL (ref 0.2–1.2)
Total Protein: 6.3 g/dL (ref 6.0–8.3)

## 2024-07-19 LAB — TSH: TSH: 2.49 u[IU]/mL (ref 0.35–5.50)

## 2024-07-19 LAB — HEMOGLOBIN A1C: Hgb A1c MFr Bld: 6.3 % (ref 4.6–6.5)

## 2024-07-19 MED ORDER — ROSUVASTATIN CALCIUM 20 MG PO TABS: 20.0000 mg | ORAL_TABLET | Freq: Every day | ORAL | 3 refills | Status: AC

## 2024-07-19 NOTE — Patient Instructions (Addendum)
 It was good to see you today- flu shot today  Recommend covid booster at your convenience at your pharmacy Congrats on cutting way down on drinking!  This is great for your health  Keep working on gradually increasing your exercise  Assuming all is well please see me in about 6 months

## 2024-07-24 DIAGNOSIS — Z1231 Encounter for screening mammogram for malignant neoplasm of breast: Secondary | ICD-10-CM | POA: Diagnosis not present

## 2024-07-24 LAB — HM MAMMOGRAPHY

## 2024-07-26 ENCOUNTER — Encounter: Payer: Self-pay | Admitting: Family Medicine

## 2024-08-15 DIAGNOSIS — H353112 Nonexudative age-related macular degeneration, right eye, intermediate dry stage: Secondary | ICD-10-CM | POA: Diagnosis not present

## 2024-08-15 DIAGNOSIS — H35721 Serous detachment of retinal pigment epithelium, right eye: Secondary | ICD-10-CM | POA: Diagnosis not present

## 2024-08-15 DIAGNOSIS — H353221 Exudative age-related macular degeneration, left eye, with active choroidal neovascularization: Secondary | ICD-10-CM | POA: Diagnosis not present

## 2024-08-15 DIAGNOSIS — H35722 Serous detachment of retinal pigment epithelium, left eye: Secondary | ICD-10-CM | POA: Diagnosis not present

## 2024-08-15 DIAGNOSIS — H43812 Vitreous degeneration, left eye: Secondary | ICD-10-CM | POA: Diagnosis not present

## 2024-08-15 DIAGNOSIS — D3132 Benign neoplasm of left choroid: Secondary | ICD-10-CM | POA: Diagnosis not present

## 2024-08-15 DIAGNOSIS — H353122 Nonexudative age-related macular degeneration, left eye, intermediate dry stage: Secondary | ICD-10-CM | POA: Diagnosis not present

## 2024-08-15 DIAGNOSIS — H353211 Exudative age-related macular degeneration, right eye, with active choroidal neovascularization: Secondary | ICD-10-CM | POA: Diagnosis not present

## 2024-08-17 DIAGNOSIS — M810 Age-related osteoporosis without current pathological fracture: Secondary | ICD-10-CM | POA: Diagnosis not present

## 2024-08-17 LAB — HM DEXA SCAN

## 2024-08-20 ENCOUNTER — Ambulatory Visit: Admitting: Physician Assistant

## 2024-08-20 ENCOUNTER — Encounter: Payer: Self-pay | Admitting: Physician Assistant

## 2024-08-20 DIAGNOSIS — F5101 Primary insomnia: Secondary | ICD-10-CM | POA: Diagnosis not present

## 2024-08-20 DIAGNOSIS — F3341 Major depressive disorder, recurrent, in partial remission: Secondary | ICD-10-CM

## 2024-08-20 DIAGNOSIS — F411 Generalized anxiety disorder: Secondary | ICD-10-CM

## 2024-08-20 DIAGNOSIS — F3342 Major depressive disorder, recurrent, in full remission: Secondary | ICD-10-CM | POA: Diagnosis not present

## 2024-08-20 MED ORDER — ZOLPIDEM TARTRATE ER 6.25 MG PO TBCR: 6.2500 mg | EXTENDED_RELEASE_TABLET | Freq: Every evening | ORAL | 1 refills | Status: AC | PRN

## 2024-08-20 MED ORDER — DOXEPIN HCL 100 MG PO CAPS: 100.0000 mg | ORAL_CAPSULE | Freq: Every day | ORAL | 1 refills | Status: AC

## 2024-08-20 MED ORDER — ALPRAZOLAM 0.5 MG PO TABS: 0.5000 mg | ORAL_TABLET | Freq: Two times a day (BID) | ORAL | 1 refills | Status: AC | PRN

## 2024-08-20 MED ORDER — VENLAFAXINE HCL ER 75 MG PO CP24: 75.0000 mg | ORAL_CAPSULE | Freq: Every day | ORAL | 1 refills | Status: AC

## 2024-08-20 NOTE — Progress Notes (Signed)
 [facility]  Patient ID: Angie Liu,  MRN: [id number]  PCP: Watt Harlene BROCKS, MD  Date of Evaluation: 08/20/2024 Time spent:20 minutes  Chief Complaint:  Chief Complaint   Anxiety; Depression; Insomnia; Follow-up    HISTORY/CURRENT STATUS: HPI  For routine med check.   Doing really well. Just turned 75 and enjoyed her birthday and being celebrated.  Her husband asked her about whether she should go off the Effexor  but she doesn't think she should.  Things are going well as they are.  She is able to enjoy things.  Energy and motivation are good.  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  No extreme sadness, tearfulness, or feelings of hopelessness.  Sleeps well most of the time.  ADLs and personal hygiene are normal.   Denies any changes in concentration, making decisions, or remembering things.  Appetite has not changed.  Anxiety is controlled.  Xanax  helps.  No mania, delirium, AH/VH.  No SI/HI.  Individual Medical History/ Review of Systems: Changes? :No      Past Psychotropic Medication Trials: Lyrica  Alprazolam   Doxepin - Has been ineffective at 25 mg. Noticed some slight improvement in sleep initiation and maintenance with increase to 50 mg po QHS. Started 25 mg on 09/26/17.  Hydroxyzine- Ineffective Xanax  Ambien  CR- Has been most helpful Ambien  Sonata Rozerem- Ineffective Belsomra -partially effective.  Trileptal- Took for several years Remeron Trazodone Bupropion Lexapro- Helpful many years ago but did not respond more recently. Wellbutrin- Tremor Luvox- Took 50 mg po qd Prozac- took briefly and did not respond well Zoloft- "remember I didn't like it." Cymbalta- May have caused tremors Has not taken Celexa, Buspar, Effexor  Abilify, Lamictal, Seroquel, Olanzapine  Allergies: Dust mite extract and Latex  Current Medications:  Current Outpatient Medications:    alendronate  (FOSAMAX ) 70 MG tablet, Take 1 tablet (70 mg total) by mouth  once a week. Take with full glass of water on an empty stomach, Disp: 12 tablet, Rfl: 3   b complex vitamins capsule, Take 1 capsule by mouth daily., Disp: , Rfl:    Multiple Vitamins-Minerals (PRESERVISION AREDS 2 PO), Take by mouth., Disp: , Rfl:    Prenatal Vit-Fe Fumarate-FA (PRENATAL VITAMINS PO), Take by mouth., Disp: , Rfl:    progesterone  (PROMETRIUM ) 100 MG capsule, Take 1 capsule (100 mg total) by mouth daily., Disp: 90 capsule, Rfl: 0   rosuvastatin  (CRESTOR ) 20 MG tablet, Take 1 tablet (20 mg total) by mouth daily., Disp: 90 tablet, Rfl: 3   ALPRAZolam  (XANAX ) 0.5 MG tablet, Take 1 tablet (0.5 mg total) by mouth 2 (two) times daily as needed for anxiety., Disp: 180 tablet, Rfl: 1   doxepin  (SINEQUAN ) 100 MG capsule, Take 1 capsule (100 mg total) by mouth at bedtime., Disp: 90 capsule, Rfl: 1   ondansetron  (ZOFRAN -ODT) 8 MG disintegrating tablet, Take 1 tablet (8 mg total) by mouth every 8 (eight) hours as needed for nausea or vomiting., Disp: 20 tablet, Rfl: 0   venlafaxine  XR (EFFEXOR  XR) 75 MG 24 hr capsule, Take 1 capsule (75 mg total) by mouth daily with breakfast., Disp: 90 capsule, Rfl: 1   zolpidem  (AMBIEN  CR) 6.25 MG CR tablet, Take 1 tablet (6.25 mg total) by mouth at bedtime as needed., Disp: 90 tablet, Rfl: 1 Medication Side Effects: none  Family Medical/ Social History: Changes? No  MENTAL HEALTH EXAM:  There were no vitals taken for this visit.There is no height or weight on file to calculate BMI.  General  Appearance: Casual and Well Groomed  Eye Contact:  Good  Speech:  Clear and Coherent and Normal Rate  Volume:  Normal  Mood:  Euthymic  Affect:  Congruent  Thought Process:  Goal Directed and Descriptions of Associations: Circumstantial  Orientation:  Full (Time, Place, and Person)  Thought Content: Logical   Suicidal Thoughts:  No  Homicidal Thoughts:  No  Memory:  WNL  Judgement:  Good  Insight:  Good  Psychomotor Activity:  Normal  Concentration:   Concentration: Good and Attention Span: Good  Recall:  Good  Fund of Knowledge: Good  Language: Good  Assets:  Communication Skills Desire for Improvement Financial Resources/Insurance Housing Resilience Transportation  ADL's:  Intact  Cognition: WNL  Prognosis:  Good   DIAGNOSES:    ICD-10-CM   1. Primary insomnia  F51.01 zolpidem  (AMBIEN  CR) 6.25 MG CR tablet    doxepin  (SINEQUAN ) 100 MG capsule    2. Generalized anxiety disorder  F41.1 venlafaxine  XR (EFFEXOR  XR) 75 MG 24 hr capsule    ALPRAZolam  (XANAX ) 0.5 MG tablet    3. Recurrent major depressive disorder, in partial remission  F33.41 venlafaxine  XR (EFFEXOR  XR) 75 MG 24 hr capsule    4. Recurrent major depressive disorder, in full remission  F33.42 doxepin  (SINEQUAN ) 100 MG capsule      Receiving Psychotherapy: No   RECOMMENDATIONS:   PDMP reviewed.  Ambien  filled 05/30/2024.  Xanax  filled 08/02/2024. I provided approximately  20  minutes of face to face time during this encounter, including time spent before and after the visit in records review, medical decision making, counseling pertinent to today's visit, and charting.   She's doing well.  I agree that she shouldn't make any changes b/c she's doing so well as.  We can revisit this in the spring of next year, after the holidays and winter is over.   Continue Xanax  0.5 mg, 1 p.o. twice daily as needed. Continue doxepin  100 mg, 1 p.o. nightly. Continue Effexor  XR 75 mg, 1 p.o. daily. Continue Ambien  CR 6.25 mg, 1 p.o. nightly as needed sleep. Return in 5 months.   Verneita Cooks, PA-C

## 2024-09-17 DIAGNOSIS — H43812 Vitreous degeneration, left eye: Secondary | ICD-10-CM | POA: Diagnosis not present

## 2024-09-17 DIAGNOSIS — H353231 Exudative age-related macular degeneration, bilateral, with active choroidal neovascularization: Secondary | ICD-10-CM | POA: Diagnosis not present

## 2024-09-17 DIAGNOSIS — D3132 Benign neoplasm of left choroid: Secondary | ICD-10-CM | POA: Diagnosis not present

## 2024-09-17 DIAGNOSIS — H353132 Nonexudative age-related macular degeneration, bilateral, intermediate dry stage: Secondary | ICD-10-CM | POA: Diagnosis not present

## 2024-09-17 DIAGNOSIS — H35723 Serous detachment of retinal pigment epithelium, bilateral: Secondary | ICD-10-CM | POA: Diagnosis not present

## 2024-09-17 DIAGNOSIS — H353221 Exudative age-related macular degeneration, left eye, with active choroidal neovascularization: Secondary | ICD-10-CM | POA: Diagnosis not present

## 2024-09-17 DIAGNOSIS — H353211 Exudative age-related macular degeneration, right eye, with active choroidal neovascularization: Secondary | ICD-10-CM | POA: Diagnosis not present

## 2024-09-25 DIAGNOSIS — H818X9 Other disorders of vestibular function, unspecified ear: Secondary | ICD-10-CM | POA: Diagnosis not present

## 2024-09-25 DIAGNOSIS — R42 Dizziness and giddiness: Secondary | ICD-10-CM | POA: Diagnosis not present

## 2024-09-25 DIAGNOSIS — M6281 Muscle weakness (generalized): Secondary | ICD-10-CM | POA: Diagnosis not present

## 2024-10-02 DIAGNOSIS — H818X9 Other disorders of vestibular function, unspecified ear: Secondary | ICD-10-CM | POA: Diagnosis not present

## 2024-10-02 DIAGNOSIS — M6281 Muscle weakness (generalized): Secondary | ICD-10-CM | POA: Diagnosis not present

## 2024-10-02 DIAGNOSIS — R42 Dizziness and giddiness: Secondary | ICD-10-CM | POA: Diagnosis not present

## 2024-10-05 DIAGNOSIS — R42 Dizziness and giddiness: Secondary | ICD-10-CM | POA: Diagnosis not present

## 2024-10-05 DIAGNOSIS — M6281 Muscle weakness (generalized): Secondary | ICD-10-CM | POA: Diagnosis not present

## 2024-10-05 DIAGNOSIS — H818X9 Other disorders of vestibular function, unspecified ear: Secondary | ICD-10-CM | POA: Diagnosis not present

## 2024-10-06 ENCOUNTER — Other Ambulatory Visit: Payer: Self-pay | Admitting: Family Medicine

## 2024-10-06 DIAGNOSIS — F5101 Primary insomnia: Secondary | ICD-10-CM

## 2024-10-08 DIAGNOSIS — H818X9 Other disorders of vestibular function, unspecified ear: Secondary | ICD-10-CM | POA: Diagnosis not present

## 2024-10-08 DIAGNOSIS — M6281 Muscle weakness (generalized): Secondary | ICD-10-CM | POA: Diagnosis not present

## 2024-10-08 DIAGNOSIS — R42 Dizziness and giddiness: Secondary | ICD-10-CM | POA: Diagnosis not present

## 2024-10-10 DIAGNOSIS — M6281 Muscle weakness (generalized): Secondary | ICD-10-CM | POA: Diagnosis not present

## 2024-10-10 DIAGNOSIS — H818X9 Other disorders of vestibular function, unspecified ear: Secondary | ICD-10-CM | POA: Diagnosis not present

## 2024-10-10 DIAGNOSIS — R42 Dizziness and giddiness: Secondary | ICD-10-CM | POA: Diagnosis not present

## 2024-10-22 LAB — OPHTHALMOLOGY REPORT-SCANNED

## 2025-01-16 ENCOUNTER — Ambulatory Visit: Admitting: Family Medicine

## 2025-01-22 ENCOUNTER — Ambulatory Visit: Admitting: Physician Assistant

## 2025-06-27 ENCOUNTER — Ambulatory Visit

## 2025-07-24 ENCOUNTER — Encounter: Admitting: Family Medicine
